# Patient Record
Sex: Male | Born: 1990 | Hispanic: Yes | Marital: Single | Smoking: Former smoker
Health system: Southern US, Community
[De-identification: ages and names within clinical notes are randomized; demographics above are authoritative.]

## PROBLEM LIST (undated history)

## (undated) DIAGNOSIS — E785 Hyperlipidemia, unspecified: Secondary | ICD-10-CM

## (undated) DIAGNOSIS — E119 Type 2 diabetes mellitus without complications: Secondary | ICD-10-CM

## (undated) HISTORY — PX: NO PAST SURGERIES: SHX2092

## (undated) HISTORY — DX: Hyperlipidemia, unspecified: E78.5

---

## 2020-11-13 ENCOUNTER — Ambulatory Visit
Admission: EM | Admit: 2020-11-13 | Discharge: 2020-11-13 | Disposition: A | Discharge status: Home / Self Care | Payer: Self-pay | Attending: Physician Assistant | Admitting: Physician Assistant

## 2020-11-13 ENCOUNTER — Encounter: Payer: Self-pay | Admitting: Emergency Medicine

## 2020-11-13 ENCOUNTER — Other Ambulatory Visit: Payer: Self-pay

## 2020-11-13 DIAGNOSIS — L02412 Cutaneous abscess of left axilla: Secondary | ICD-10-CM

## 2020-11-13 MED ORDER — DOXYCYCLINE HYCLATE 100 MG PO CAPS: 100.0000 mg | ORAL_CAPSULE | Freq: Two times a day (BID) | ORAL | 0 refills | Status: AC

## 2020-11-13 NOTE — ED Provider Notes (Signed)
[facility]    CSN: [id number] Arrival date & time: 11/13/20  1259      History   Chief Complaint Chief Complaint  Patient presents with  . Abscess    HPI Joe Kelly is a 30 y.o. male presenting for an area of swelling, redness and pain of the left axilla x4 days.  Patient denies any injury.  He has applied a topical over-the-counter cream for infection without any improvement in symptoms.  He denies any drainage from the area.  Denies any similar problem in the past.  No history of recurrent skin infections or MRSA.  Has not taken anything for pain relief.  No other complaints.  Patient is mostly Spanish-speaking and his friend is here to help translate.  HPI  History reviewed. No pertinent past medical history.  There are no problems to display for this patient.   History reviewed. No pertinent surgical history.     Home Medications    Prior to Admission medications   Medication Sig Start Date End Date Taking? Authorizing Provider  doxycycline (VIBRAMYCIN) 100 MG capsule Take 1 capsule (100 mg total) by mouth 2 (two) times daily for 10 days. 11/13/20 11/23/20 Yes Shirlee Latch, PA-C    Family History History reviewed. No pertinent family history.  Social History Social History   Tobacco Use  . Smoking status: Current Every Day Smoker    Types: Cigarettes  . Smokeless tobacco: Never Used  Vaping Use  . Vaping Use: Never used  Substance Use Topics  . Alcohol use: Not Currently  . Drug use: Not Currently     Allergies   Patient has no known allergies.   Review of Systems Review of Systems  Constitutional: Negative for fatigue and fever.  Skin: Positive for color change. Negative for wound.       "abscess" of axilla  Neurological: Negative for weakness.  Hematological: Negative for adenopathy.     Physical Exam Triage Vital Signs ED Triage Vitals  Enc Vitals Group     BP 11/13/20 1329 128/81     Pulse Rate 11/13/20  1329 92     Resp 11/13/20 1329 18     Temp 11/13/20 1329 98.6 F (37 C)     Temp Source 11/13/20 1329 Oral     SpO2 11/13/20 1329 98 %     Weight 11/13/20 1327 130 lb (59 kg)     Height 11/13/20 1327 5\' 7"  (1.702 m)     Head Circumference --      Peak Flow --      Pain Score 11/13/20 1326 8     Pain Loc --      Pain Edu? --      Excl. in GC? --    No data found.  Updated Vital Signs BP 128/81 (BP Location: Right Arm)   Pulse 92   Temp 98.6 F (37 C) (Oral)   Resp 18   Ht 5\' 7"  (1.702 m)   Wt 130 lb (59 kg)   SpO2 98%   BMI 20.36 kg/m    Physical Exam Vitals and nursing note reviewed.  Constitutional:      General: He is not in acute distress.    Appearance: Normal appearance. He is well-developed. He is not ill-appearing.  HENT:     Head: Normocephalic and atraumatic.  Eyes:     General: No scleral icterus.    Conjunctiva/sclera: Conjunctivae normal.  Cardiovascular:     Rate and Rhythm: Normal  rate and regular rhythm.     Heart sounds: Normal heart sounds.  Pulmonary:     Effort: Pulmonary effort is normal. No respiratory distress.     Breath sounds: Normal breath sounds.  Musculoskeletal:     Cervical back: Neck supple.  Skin:    General: Skin is warm and dry.     Findings: Lesion (4cm x 2.5 cm oval area of induration and erythema with fluctuance. Entire area is TTP. ) present.  Neurological:     General: No focal deficit present.     Mental Status: He is alert. Mental status is at baseline.     Motor: No weakness.     Gait: Gait normal.  Psychiatric:        Mood and Affect: Mood normal.        Behavior: Behavior normal.        Thought Content: Thought content normal.      UC Treatments / Results  Labs (all labs ordered are listed, but only abnormal results are displayed) Labs Reviewed - No data to display  EKG   Radiology No results found.  Procedures Incision and Drainage  Date/Time: 11/13/2020 3:00 PM Performed by: Shirlee Latch,  PA-C Authorized by: Shirlee Latch, PA-C   Consent:    Consent obtained:  Verbal   Consent given by:  Patient   Risks, benefits, and alternatives were discussed: yes     Risks discussed:  Bleeding, infection, incomplete drainage and pain   Alternatives discussed:  Alternative treatment, delayed treatment and observation Universal protocol:    Patient identity confirmed:  Verbally with patient and arm band Location:    Type:  Abscess   Size:  4 cm x 2.5 cm   Location:  Upper extremity   Upper extremity location:  Arm   Arm location:  L upper arm Pre-procedure details:    Skin preparation:  Povidone-iodine Anesthesia:    Anesthesia method:  Local infiltration   Local anesthetic:  Lidocaine 1% w/o epi Procedure type:    Complexity:  Simple Procedure details:    Incision types:  Stab incision   Incision depth:  Dermal   Wound management:  Probed and deloculated and irrigated with saline   Drainage:  Bloody and purulent   Drainage amount:  Moderate   Wound treatment:  Wound left open   Packing materials:  1/4 in iodoform gauze Post-procedure details:    Procedure completion:  Tolerated well, no immediate complications   (including critical care time)  Medications Ordered in UC Medications - No data to display  Initial Impression / Assessment and Plan / UC Course  I have reviewed the triage vital signs and the nursing notes.  Pertinent labs & imaging results that were available during my care of the patient were reviewed by me and considered in my medical decision making (see chart for details).   Simple abscess I&D of the left axillary region.  See procedure details above.  Successful I&D as the area of induration and swelling resolved.  Packed the area and discussed care with patient.  Sent doxycycline for him to start.  Advised him to follow back up with her clinic in 2 days for reevaluation to see if it needs to be repacked.  I did review ED precautions with patient in  case any symptoms suddenly worsen.  Advised supportive care with Tylenol and/or ibuprofen for pain relief.  Follow-up in 2 days or sooner if needed.   Final Clinical Impressions(s) / UC  Diagnoses   Final diagnoses:  Abscess of left axilla     Discharge Instructions     He drenado el absceso hoy. Debe regresar en 2 das para que lo revisemos nuevamente para ver si es Armed forces logistics/support/administrative officer a empaquetarlo. No saque el embalaje. No moje el rea. Tome los [employer] se describe. Tome Tylenol y/o Motrin para Chief Technology Officer. Acuda al servicio de urgencias si tiene dolor intenso o fiebre.  I have drained the abscess today. You should come back in 2 days to have [place] look at it again to see if it needs to be re-packed. Do not take packing out. Do not get area wet. Take antibiotics as described. Take Tylenol and/or Motrin for pain. Go to ED for severe pain or fever.    ED Prescriptions    Medication Sig Dispense Auth. Provider   doxycycline (VIBRAMYCIN) 100 MG capsule Take 1 capsule (100 mg total) by mouth 2 (two) times daily for 10 days. 20 capsule Shirlee Latch, PA-C     PDMP not reviewed this encounter.   Shirlee Latch, PA-C 11/13/20 1503

## 2020-11-13 NOTE — ED Triage Notes (Signed)
Pt has abscess in the left axilla area. started bout 3 days ago. He had arm in a sling and wrapped up.

## 2020-11-13 NOTE — Discharge Instructions (Addendum)
He drenado el absceso hoy. Debe regresar en 2 das para que lo revisemos nuevamente para ver si es Armed forces logistics/support/administrative officer a empaquetarlo. No saque el embalaje. No moje el rea. Tome los [employer] se describe. Tome Tylenol y/o Motrin para Chief Technology Officer. Acuda al servicio de urgencias si tiene dolor intenso o fiebre.  I have drained the abscess today. You should come back in 2 days to have [place] look at it again to see if it needs to be re-packed. Do not take packing out. Do not get area wet. Take antibiotics as described. Take Tylenol and/or Motrin for pain. Go to ED for severe pain or fever.

## 2020-11-16 ENCOUNTER — Other Ambulatory Visit: Payer: Self-pay

## 2020-11-16 ENCOUNTER — Ambulatory Visit
Admission: EM | Admit: 2020-11-16 | Discharge: 2020-11-16 | Disposition: A | Discharge status: Home / Self Care | Payer: Self-pay | Attending: Family Medicine | Admitting: Family Medicine

## 2020-11-16 DIAGNOSIS — Z5189 Encounter for other specified aftercare: Secondary | ICD-10-CM

## 2020-11-16 NOTE — ED Provider Notes (Signed)
[facility]    CSN: [id number] Arrival date & time: 11/16/20  1204      History   Chief Complaint Chief Complaint  Patient presents with  . packing removal    Left axilla   HPI  30 year old male presents for packing removal.  Patient was seen here on 3/14.  Had incision and drainage of abscess of left axilla.  He states that he is improved.  Endorses compliance with antibiotic therapy.  Needs packing removal and reassessment today.  No fever.  No other complaints.  Home Medications    Prior to Admission medications   Medication Sig Start Date End Date Taking? Authorizing Provider  doxycycline (VIBRAMYCIN) 100 MG capsule Take 1 capsule (100 mg total) by mouth 2 (two) times daily for 10 days. 11/13/20 11/23/20 Yes Shirlee Latch, PA-C    Family History History reviewed. No pertinent family history.  Social History Social History   Tobacco Use  . Smoking status: Current Every Day Smoker    Types: Cigarettes  . Smokeless tobacco: Never Used  Vaping Use  . Vaping Use: Never used  Substance Use Topics  . Alcohol use: Not Currently  . Drug use: Not Currently     Allergies   Patient has no known allergies.   Review of Systems Review of Systems Per HPI  Physical Exam Triage Vital Signs ED Triage Vitals  Enc Vitals Group     BP 11/16/20 1233 126/79     Pulse Rate 11/16/20 1233 90     Resp 11/16/20 1233 18     Temp 11/16/20 1233 98.6 F (37 C)     Temp Source 11/16/20 1233 Oral     SpO2 11/16/20 1233 99 %     Weight --      Height --      Head Circumference --      Peak Flow --      Pain Score 11/16/20 1229 0     Pain Loc --      Pain Edu? --      Excl. in GC? --    Updated Vital Signs BP 126/79 (BP Location: Right Arm)   Pulse 90   Temp 98.6 F (37 C) (Oral)   Resp 18   SpO2 99%   Visual Acuity Right Eye Distance:   Left Eye Distance:   Bilateral Distance:    Right Eye Near:   Left Eye Near:    Bilateral Near:     Physical  Exam Constitutional:      General: He is not in acute distress.    Appearance: Normal appearance. He is not ill-appearing.  Skin:    Comments: Left axilla -wound noted with packing.  Purulent discharge noted on dressing.  No surrounding erythema.  Slight induration.  Neurological:     Mental Status: He is alert.  Psychiatric:        Mood and Affect: Mood normal.        Behavior: Behavior normal.    UC Treatments / Results  Labs (all labs ordered are listed, but only abnormal results are displayed) Labs Reviewed - No data to display  EKG   Radiology No results found.  Procedures Procedures (including critical care time)  Medications Ordered in UC Medications - No data to display  Initial Impression / Assessment and Plan / UC Course  I have reviewed the triage vital signs and the nursing notes.  Pertinent labs & imaging results that were available during my care  of the patient were reviewed by me and considered in my medical decision making (see chart for details).    30 year old male presents for reassessment and packing removal.  Abscess looks good.  Significant improvement.  Packing removed.  Wound was dressed today.  Advise completion of antibiotic therapy and supportive care.  Final Clinical Impressions(s) / UC Diagnoses   Final diagnoses:  Wound check, abscess     Discharge Instructions     Mantenga el rea limpia.  Cambie el vendaje diariamente.  Termina el antibitico.    ED Prescriptions    None     PDMP not reviewed this encounter.   Tommie Sams, [place] 11/16/20 1319

## 2020-11-16 NOTE — ED Triage Notes (Signed)
Pt here for packing removal from I&D abscess in his left axilla. Pt reports he is taking abx as prescribed.

## 2020-11-16 NOTE — Discharge Instructions (Signed)
Mantenga el rea limpia.  Cambie el vendaje diariamente.  Termina el antibitico.

## 2020-12-05 ENCOUNTER — Other Ambulatory Visit: Payer: Self-pay

## 2020-12-05 ENCOUNTER — Inpatient Hospital Stay
Admission: EM | Admit: 2020-12-05 | Discharge: 2020-12-07 | DRG: 638 | Disposition: A | Discharge status: Home / Self Care | Payer: Self-pay | Attending: Internal Medicine | Admitting: Internal Medicine

## 2020-12-05 ENCOUNTER — Emergency Department: Payer: Self-pay

## 2020-12-05 ENCOUNTER — Ambulatory Visit
Admission: EM | Admit: 2020-12-05 | Discharge: 2020-12-05 | Disposition: A | Discharge status: Home / Self Care | Payer: Self-pay | Attending: Sports Medicine | Admitting: Sports Medicine

## 2020-12-05 DIAGNOSIS — R42 Dizziness and giddiness: Secondary | ICD-10-CM

## 2020-12-05 DIAGNOSIS — E860 Dehydration: Secondary | ICD-10-CM | POA: Diagnosis present

## 2020-12-05 DIAGNOSIS — R3589 Other polyuria: Secondary | ICD-10-CM | POA: Insufficient documentation

## 2020-12-05 DIAGNOSIS — R634 Abnormal weight loss: Secondary | ICD-10-CM | POA: Insufficient documentation

## 2020-12-05 DIAGNOSIS — E1065 Type 1 diabetes mellitus with hyperglycemia: Secondary | ICD-10-CM | POA: Insufficient documentation

## 2020-12-05 DIAGNOSIS — Z833 Family history of diabetes mellitus: Secondary | ICD-10-CM

## 2020-12-05 DIAGNOSIS — E1110 Type 2 diabetes mellitus with ketoacidosis without coma: Principal | ICD-10-CM | POA: Diagnosis present

## 2020-12-05 DIAGNOSIS — E876 Hypokalemia: Secondary | ICD-10-CM | POA: Diagnosis present

## 2020-12-05 DIAGNOSIS — R1084 Generalized abdominal pain: Secondary | ICD-10-CM | POA: Insufficient documentation

## 2020-12-05 DIAGNOSIS — IMO0002 Reserved for concepts with insufficient information to code with codable children: Secondary | ICD-10-CM

## 2020-12-05 DIAGNOSIS — R631 Polydipsia: Secondary | ICD-10-CM | POA: Insufficient documentation

## 2020-12-05 DIAGNOSIS — E1310 Other specified diabetes mellitus with ketoacidosis without coma: Secondary | ICD-10-CM

## 2020-12-05 DIAGNOSIS — Z20822 Contact with and (suspected) exposure to covid-19: Secondary | ICD-10-CM | POA: Diagnosis present

## 2020-12-05 DIAGNOSIS — E871 Hypo-osmolality and hyponatremia: Secondary | ICD-10-CM | POA: Diagnosis present

## 2020-12-05 DIAGNOSIS — R5383 Other fatigue: Secondary | ICD-10-CM | POA: Insufficient documentation

## 2020-12-05 HISTORY — DX: Type 2 diabetes mellitus without complications: E11.9

## 2020-12-05 LAB — COMPREHENSIVE METABOLIC PANEL
ALT: 43 U/L (ref 0–44)
AST: 16 U/L (ref 15–41)
Albumin: 4.4 g/dL (ref 3.5–5.0)
Alkaline Phosphatase: 117 U/L (ref 38–126)
Anion gap: 16 — ABNORMAL HIGH (ref 5–15)
BUN: 16 mg/dL (ref 6–20)
CO2: 11 mmol/L — ABNORMAL LOW (ref 22–32)
Calcium: 8.7 mg/dL — ABNORMAL LOW (ref 8.9–10.3)
Chloride: 97 mmol/L — ABNORMAL LOW (ref 98–111)
Creatinine, Ser: 1.08 mg/dL (ref 0.61–1.24)
GFR, Estimated: >60 mL/min (ref >60)
Glucose, Bld: 398 mg/dL — ABNORMAL HIGH (ref 70–99)
Potassium: 4 mmol/L (ref 3.5–5.1)
Sodium: 124 mmol/L — ABNORMAL LOW (ref 135–145)
Total Bilirubin: 1.3 mg/dL — ABNORMAL HIGH (ref 0.3–1.2)
Total Protein: 8 g/dL (ref 6.5–8.1)

## 2020-12-05 LAB — CBC WITH DIFFERENTIAL/PLATELET
Abs Immature Granulocytes: 0.03 10*3/uL (ref 0.00–0.07)
Basophils Absolute: 0 10*3/uL (ref 0.0–0.1)
Basophils Relative: 0 %
Eosinophils Absolute: 0 10*3/uL (ref 0.0–0.5)
Eosinophils Relative: 0 %
HCT: 45.1 % (ref 39.0–52.0)
Hemoglobin: 15.7 g/dL (ref 13.0–17.0)
Immature Granulocytes: 0 %
Lymphocytes Relative: 19 %
Lymphs Abs: 1.5 10*3/uL (ref 0.7–4.0)
MCH: 29.5 pg (ref 26.0–34.0)
MCHC: 34.8 g/dL (ref 30.0–36.0)
MCV: 84.6 fL (ref 80.0–100.0)
Monocytes Absolute: 0.5 10*3/uL (ref 0.1–1.0)
Monocytes Relative: 6 %
Neutro Abs: 5.7 10*3/uL (ref 1.7–7.7)
Neutrophils Relative %: 75 %
Platelets: 342 10*3/uL (ref 150–400)
RBC: 5.33 MIL/uL (ref 4.22–5.81)
RDW: 12.8 % (ref 11.5–15.5)
WBC: 7.7 10*3/uL (ref 4.0–10.5)
nRBC: 0 % (ref 0.0–0.2)

## 2020-12-05 LAB — URINALYSIS, COMPLETE (UACMP) WITH MICROSCOPIC
Bacteria, UA: NONE SEEN
Bacteria, UA: NONE SEEN
Bilirubin Urine: NEGATIVE
Bilirubin Urine: NEGATIVE
Glucose, UA: 500 mg/dL — AB
Glucose, UA: >=500 mg/dL — AB
Ketones, ur: >160 mg/dL — AB
Ketones, ur: 80 mg/dL — AB
Leukocytes,Ua: NEGATIVE
Leukocytes,Ua: NEGATIVE
Nitrite: NEGATIVE
Nitrite: NEGATIVE
Protein, ur: 30 mg/dL — AB
Protein, ur: 100 mg/dL — AB
Specific Gravity, Urine: 1.025 (ref 1.005–1.030)
Specific Gravity, Urine: 1.031 — ABNORMAL HIGH (ref 1.005–1.030)
WBC, UA: NONE SEEN WBC/hpf (ref 0–5)
pH: 5 (ref 5.0–8.0)
pH: 5 (ref 5.0–8.0)

## 2020-12-05 LAB — CBC
HCT: 46.6 % (ref 39.0–52.0)
HCT: 39.3 % (ref 39.0–52.0)
Hemoglobin: 16.2 g/dL (ref 13.0–17.0)
Hemoglobin: 13.8 g/dL (ref 13.0–17.0)
MCH: 29.7 pg (ref 26.0–34.0)
MCH: 29.7 pg (ref 26.0–34.0)
MCHC: 34.8 g/dL (ref 30.0–36.0)
MCHC: 35.1 g/dL (ref 30.0–36.0)
MCV: 85.5 fL (ref 80.0–100.0)
MCV: 84.7 fL (ref 80.0–100.0)
Platelets: 386 10*3/uL (ref 150–400)
Platelets: 301 10*3/uL (ref 150–400)
RBC: 5.45 MIL/uL (ref 4.22–5.81)
RBC: 4.64 MIL/uL (ref 4.22–5.81)
RDW: 12.7 % (ref 11.5–15.5)
RDW: 12.5 % (ref 11.5–15.5)
WBC: 7.9 10*3/uL (ref 4.0–10.5)
WBC: 6 10*3/uL (ref 4.0–10.5)
nRBC: 0 % (ref 0.0–0.2)
nRBC: 0 % (ref 0.0–0.2)

## 2020-12-05 LAB — CBG MONITORING, ED
Glucose-Capillary: 325 mg/dL — ABNORMAL HIGH (ref 70–99)
Glucose-Capillary: 288 mg/dL — ABNORMAL HIGH (ref 70–99)
Glucose-Capillary: 223 mg/dL — ABNORMAL HIGH (ref 70–99)
Glucose-Capillary: 235 mg/dL — ABNORMAL HIGH (ref 70–99)

## 2020-12-05 LAB — BASIC METABOLIC PANEL
Anion gap: 17 — ABNORMAL HIGH (ref 5–15)
Anion gap: 12 (ref 5–15)
BUN: 15 mg/dL (ref 6–20)
BUN: 13 mg/dL (ref 6–20)
CO2: 11 mmol/L — ABNORMAL LOW (ref 22–32)
CO2: 16 mmol/L — ABNORMAL LOW (ref 22–32)
Calcium: 9 mg/dL (ref 8.9–10.3)
Calcium: 8.6 mg/dL — ABNORMAL LOW (ref 8.9–10.3)
Chloride: 98 mmol/L (ref 98–111)
Chloride: 102 mmol/L (ref 98–111)
Creatinine, Ser: 0.88 mg/dL (ref 0.61–1.24)
Creatinine, Ser: 0.71 mg/dL (ref 0.61–1.24)
GFR, Estimated: >60 mL/min (ref >60)
GFR, Estimated: >60 mL/min (ref >60)
Glucose, Bld: 326 mg/dL — ABNORMAL HIGH (ref 70–99)
Glucose, Bld: 226 mg/dL — ABNORMAL HIGH (ref 70–99)
Potassium: 4 mmol/L (ref 3.5–5.1)
Potassium: 4.3 mmol/L (ref 3.5–5.1)
Sodium: 126 mmol/L — ABNORMAL LOW (ref 135–145)
Sodium: 130 mmol/L — ABNORMAL LOW (ref 135–145)

## 2020-12-05 LAB — BLOOD GAS, VENOUS
Acid-base deficit: 15.9 mmol/L — ABNORMAL HIGH (ref 0.0–2.0)
Bicarbonate: 11.3 mmol/L — ABNORMAL LOW (ref 20.0–28.0)
O2 Saturation: 41.1 %
Patient temperature: 37
pCO2, Ven: 31 mmHg — ABNORMAL LOW (ref 44.0–60.0)
pH, Ven: 7.17 — CL (ref 7.250–7.430)
pO2, Ven: 31 mmHg — CL (ref 32.0–45.0)

## 2020-12-05 LAB — RESP PANEL BY RT-PCR (FLU A&B, COVID) ARPGX2
Influenza A by PCR: NEGATIVE
Influenza B by PCR: NEGATIVE
SARS Coronavirus 2 by RT PCR: NEGATIVE

## 2020-12-05 LAB — LIPASE, BLOOD: Lipase: 47 U/L (ref 11–51)

## 2020-12-05 LAB — HEPATIC FUNCTION PANEL
ALT: 43 U/L (ref 0–44)
AST: 20 U/L (ref 15–41)
Albumin: 4.5 g/dL (ref 3.5–5.0)
Alkaline Phosphatase: 122 U/L (ref 38–126)
Bilirubin, Direct: 0.2 mg/dL (ref 0.0–0.2)
Indirect Bilirubin: 1.7 mg/dL — ABNORMAL HIGH (ref 0.3–0.9)
Total Bilirubin: 1.9 mg/dL — ABNORMAL HIGH (ref 0.3–1.2)
Total Protein: 8.2 g/dL — ABNORMAL HIGH (ref 6.5–8.1)

## 2020-12-05 LAB — TROPONIN I (HIGH SENSITIVITY): Troponin I (High Sensitivity): 8 ng/L (ref <18)

## 2020-12-05 LAB — TSH: TSH: 1.934 u[IU]/mL (ref 0.350–4.500)

## 2020-12-05 LAB — GLUCOSE, CAPILLARY
Glucose-Capillary: 239 mg/dL — ABNORMAL HIGH (ref 70–99)
Glucose-Capillary: 212 mg/dL — ABNORMAL HIGH (ref 70–99)

## 2020-12-05 LAB — BETA-HYDROXYBUTYRIC ACID
Beta-Hydroxybutyric Acid: >8 mmol/L — ABNORMAL HIGH (ref 0.05–0.27)
Beta-Hydroxybutyric Acid: 5.5 mmol/L — ABNORMAL HIGH (ref 0.05–0.27)

## 2020-12-05 LAB — MRSA PCR SCREENING: MRSA by PCR: NEGATIVE

## 2020-12-05 LAB — HEMOGLOBIN A1C
Hgb A1c MFr Bld: 11.5 % — ABNORMAL HIGH (ref 4.8–5.6)
Hgb A1c MFr Bld: 11.7 % — ABNORMAL HIGH (ref 4.8–5.6)
Mean Plasma Glucose: 283.35 mg/dL
Mean Plasma Glucose: 289.09 mg/dL

## 2020-12-05 MED ORDER — POTASSIUM CHLORIDE 10 MEQ/100ML IV SOLN
10.0000 meq | INTRAVENOUS | Status: AC
  Administered 2020-12-06 (×2): 10 meq via INTRAVENOUS
  Filled 2020-12-05 (×2): qty 100

## 2020-12-05 MED ORDER — DEXTROSE 50 % IV SOLN: 0.0000 mL | INTRAVENOUS | Status: DC | PRN

## 2020-12-05 MED ORDER — LACTATED RINGERS IV SOLN: INTRAVENOUS | Status: DC

## 2020-12-05 MED ORDER — LACTATED RINGERS IV BOLUS
20.0000 mL/kg | Freq: Once | INTRAVENOUS | Status: AC
  Administered 2020-12-05: 1034 mL via INTRAVENOUS

## 2020-12-05 MED ORDER — INSULIN REGULAR(HUMAN) IN NACL 100-0.9 UT/100ML-% IV SOLN: INTRAVENOUS | Status: DC

## 2020-12-05 MED ORDER — DEXTROSE IN LACTATED RINGERS 5 % IV SOLN: INTRAVENOUS | Status: DC

## 2020-12-05 MED ORDER — LACTATED RINGERS IV BOLUS
1000.0000 mL | Freq: Once | INTRAVENOUS | Status: AC
  Administered 2020-12-05: 1000 mL via INTRAVENOUS

## 2020-12-05 MED ORDER — INSULIN REGULAR(HUMAN) IN NACL 100-0.9 UT/100ML-% IV SOLN
INTRAVENOUS | Status: DC
  Administered 2020-12-05: 4.4 [IU]/h via INTRAVENOUS
  Filled 2020-12-05: qty 100

## 2020-12-05 MED ORDER — ENOXAPARIN SODIUM 40 MG/0.4ML ~~LOC~~ SOLN
40.0000 mg | SUBCUTANEOUS | Status: DC
  Administered 2020-12-05 – 2020-12-06 (×2): 40 mg via SUBCUTANEOUS
  Filled 2020-12-05 (×2): qty 0.4

## 2020-12-05 MED ORDER — POTASSIUM CHLORIDE 10 MEQ/100ML IV SOLN
10.0000 meq | INTRAVENOUS | Status: AC
  Administered 2020-12-05 (×2): 10 meq via INTRAVENOUS
  Filled 2020-12-05 (×2): qty 100

## 2020-12-05 MED ORDER — CHLORHEXIDINE GLUCONATE CLOTH 2 % EX PADS
6.0000 | MEDICATED_PAD | Freq: Every day | CUTANEOUS | Status: DC
  Administered 2020-12-05: 6 via TOPICAL

## 2020-12-05 NOTE — ED Triage Notes (Signed)
Pt comes from [facility] with c/o hyperglycemia. Pt states he was just dx today there with type 1 diabetes. Pt was informed to come here for further care.  Pt states loss of appetite, fatigue and lightheaded for 1 week.  Pt states it was in 400s.

## 2020-12-05 NOTE — ED Triage Notes (Signed)
Pt reports feeling lightheaded x1 week also reports having loss of appetite and feeling fatigue.

## 2020-12-05 NOTE — ED Provider Notes (Signed)
[facility] Emergency Department Provider Note  ____________________________________________   Event Date/Time   First MD Initiated Contact with Patient 12/05/20 1730     (approximate)  I have reviewed the triage vital signs and the nursing notes.   HISTORY  Chief Complaint Hyperglycemia    HPI Joe Kelly is a 30 y.o. male otherwise healthy who comes in for new diagnosis of diabetes.  Patient was at urgent care where they noted that his sugar was elevated that he should come into the ER for admission.  Patient reports having 1 week of weight loss, increased thirst, increased urination and fatigue.  Is been constant, nothing makes better, nothing makes it worse.  It is moderate.  Denies ever having this previously.  Denies ever being told he is diabetic.  He reports a little of shortness of breath associated with the.   Spanish interpretor was used          Past Medical History:  Diagnosis Date  . Diabetes mellitus without complication (HCC)     There are no problems to display for this patient.   History reviewed. No pertinent surgical history.  Prior to Admission medications   Not on File    Allergies Coconut flavor  No family history on file.  Social History Social History   Tobacco Use  . Smoking status: Never Smoker  . Smokeless tobacco: Never Used  Vaping Use  . Vaping Use: Never used  Substance Use Topics  . Alcohol use: Not Currently  . Drug use: Not Currently      Review of Systems Constitutional: No fever/chills, weightloss  Eyes: No visual changes. ENT: No sore throat. Increased thirst  Cardiovascular: Denies chest pain. Respiratory: + sob  Gastrointestinal: No abdominal pain.  No nausea, no vomiting.  No diarrhea.  No constipation. Genitourinary: Negative for dysuria. Increased urination  Musculoskeletal: Negative for back pain. Skin: Negative for rash. Neurological: Negative for headaches,  focal weakness or numbness. All other ROS negative ____________________________________________   PHYSICAL EXAM:  VITAL SIGNS: ED Triage Vitals  Enc Vitals Group     BP 12/05/20 1635 139/90     Pulse Rate 12/05/20 1635 (!) 107     Resp 12/05/20 1635 18     Temp 12/05/20 1635 98.2 F (36.8 C)     Temp Source 12/05/20 1635 Oral     SpO2 12/05/20 1635 100 %     Weight 12/05/20 1633 114 lb (51.7 kg)     Height 12/05/20 1633 5' (1.524 m)     Head Circumference --      Peak Flow --      Pain Score 12/05/20 1632 6     Pain Loc --      Pain Edu? --      Excl. in GC? --     Constitutional: Alert and oriented. Well appearing and in no acute distress. Eyes: Conjunctivae are normal. EOMI. Head: Atraumatic. Nose: No congestion/rhinnorhea. Mouth/Throat: Mucous membranes are dry    Neck: No stridor. Trachea Midline. FROM Cardiovascular: tachy, regular rhythm. Grossly normal heart sounds.  Good peripheral circulation. Respiratory: Normal respiratory effort.  No retractions. Lungs CTAB. Gastrointestinal: Soft and nontender. No distention. No abdominal bruits.  Musculoskeletal: No lower extremity tenderness nor edema.  No joint effusions. Neurologic:  Normal speech and language. No gross focal neurologic deficits are appreciated.  Skin:  Skin is warm, dry and intact. No rash noted. Psychiatric: Mood and affect are normal. Speech and behavior are normal.  GU: Deferred   ____________________________________________   LABS (all labs ordered are listed, but only abnormal results are displayed)  Labs Reviewed  BASIC METABOLIC PANEL - Abnormal; Notable for the following components:      Result Value   Sodium 126 (*)    CO2 11 (*)    Glucose, Bld 326 (*)    Anion gap 17 (*)    All other components within normal limits  URINALYSIS, COMPLETE (UACMP) WITH MICROSCOPIC - Abnormal; Notable for the following components:   Color, Urine YELLOW (*)    APPearance CLEAR (*)    Specific Gravity,  Urine 1.031 (*)    Glucose, UA >=500 (*)    Hgb urine dipstick MODERATE (*)    Ketones, ur 80 (*)    Protein, ur 100 (*)    All other components within normal limits  CBG MONITORING, ED - Abnormal; Notable for the following components:   Glucose-Capillary 325 (*)    All other components within normal limits  RESP PANEL BY RT-PCR (FLU A&B, COVID) ARPGX2  CBC  BETA-HYDROXYBUTYRIC ACID  HEPATIC FUNCTION PANEL  BLOOD GAS, VENOUS  HEMOGLOBIN A1C  CBG MONITORING, ED   ____________________________________________   ED ECG REPORT I, Concha Se, the attending physician, personally viewed and interpreted this ECG.  Sinus, some ST elevation in 1 to aVL V2 looks more like early repole though he does have a T wave version in lead III, normal intervals ____________________________________________  RADIOLOGY I, Concha Se, personally viewed and evaluated these images (plain radiographs) as part of my medical decision making, as well as reviewing the written report by the radiologist.  ED MD interpretation:  No PNA   Official radiology report(s): DG Chest 2 View  Result Date: 12/05/2020 CLINICAL DATA:  Shortness of breath EXAM: CHEST - 2 VIEW COMPARISON:  None. FINDINGS: The heart size and mediastinal contours are within normal limits. Both lungs are clear. The visualized skeletal structures are unremarkable. IMPRESSION: Negative. Electronically Signed   By: Charlett Nose M.D.   On: 12/05/2020 18:32    ____________________________________________   PROCEDURES  Procedure(s) performed (including Critical Care):  .Critical Care Performed by: Concha Se, MD Authorized by: Concha Se, MD   Critical care provider statement:    Critical care time (minutes):  45   Critical care was necessary to treat or prevent imminent or life-threatening deterioration of the following conditions:  Endocrine crisis   Critical care was time spent personally by me on the following activities:   Discussions with consultants, evaluation of patient's response to treatment, examination of patient, ordering and performing treatments and interventions, ordering and review of laboratory studies, ordering and review of radiographic studies, pulse oximetry, re-evaluation of patient's condition, obtaining history from patient or surrogate and review of old charts .1-3 Lead EKG Interpretation Performed by: Concha Se, MD Authorized by: Concha Se, MD     Interpretation: abnormal     ECG rate:  100s    ECG rate assessment: tachycardic     Rhythm: sinus rhythm     Ectopy: none     Conduction: normal       ____________________________________________   INITIAL IMPRESSION / ASSESSMENT AND PLAN / ED COURSE  Jerrell Mangel Kelly was evaluated in Emergency Department on 12/05/2020 for the symptoms described in the history of present illness. He was evaluated in the context of the global COVID-19 pandemic, which necessitated consideration that the patient might be at risk for infection with the SARS-CoV-2  virus that causes COVID-19. Institutional protocols and algorithms that pertain to the evaluation of patients at risk for COVID-19 are in a state of rapid change based on information released by regulatory bodies including the CDC and federal and state organizations. These policies and algorithms were followed during the patient's care in the ED.    Patient comes in with new diagnosis of diabetes.  Labs ordered to evaluate for DKA.  Urine evaluate for UTI.  Patient does report a little bit of shortness of breath and will get chest x-ray and EKG and covid swab to evaluate for PNA, ACS, Covid.  Lower supsicion for PE at this time.   Patient's bicarb is low at 11 and glucose is elevated in the 300s with an elevated anion gap.  Potassium is 4.  Patient will be started on insulin.  Will discuss the hospital team for admission.  His EKG was reading as concern for an acute MI but I do not think  that it is consistent with that.  Looks more like early repolarization although he does have a T wave inversion lead III.  I will add on a cardiac marker just to make sure but no chest pain at this time.   PH 7.17   K 4.0 will give some K repletion and start on insulin   Pt handed off to hopsital team   ____________________________________________   FINAL CLINICAL IMPRESSION(S) / ED DIAGNOSES   Final diagnoses:  Diabetic ketoacidosis without coma associated with other specified diabetes mellitus (HCC)      MEDICATIONS GIVEN DURING THIS VISIT:  Medications  insulin regular, human (MYXREDLIN) 100 units/ 100 mL infusion (has no administration in time range)  lactated ringers infusion (has no administration in time range)  dextrose 5 % in lactated ringers infusion (has no administration in time range)  dextrose 50 % solution 0-50 mL (has no administration in time range)  potassium chloride 10 mEq in 100 mL IVPB (has no administration in time range)  lactated ringers bolus 1,000 mL (1,000 mLs Intravenous New Bag/Given 12/05/20 1757)     ED Discharge Orders    None       Note:  This document was prepared using Dragon voice recognition software and may include unintentional dictation errors.   Concha Se, MD 12/05/20 [phone]

## 2020-12-05 NOTE — Discharge Instructions (Addendum)
You have new onset type 1 diabetes. Your labs also show that you have some electrolyte abnormalities and some abnormalities that will require you to go to the hospital and get IV insulin to correct. Please go to [facility] emergency department.  Your friend can drive you there.  You should not drive. I will call the hospital and let them know to expect you.

## 2020-12-05 NOTE — H&P (Signed)
History and Physical   Joe Kelly CVE:[id number] DOB: [birth year] DOA: 12/05/2020  Referring MD/NP/PA: Dr. Fuller Plan  PCP: Patient, No Pcp Per (Inactive)   Outpatient Specialists: None  Patient coming from: Home  Chief Complaint: Nausea with high blood sugar  HPI: Joe Kelly is a 30 y.o. male with medical history significant of no significant past medical history who was seen at the urgent care center today some weakness weight loss increased thirst and urination.  He was diagnosed with diabetes due to elevated blood sugar.  Patient sent to the ER and being evaluated as an newly diagnosed diabetes.  He has never been on any medication as he has never been diagnosed.  He has family history of diabetes.  He has some nausea and slight shortness of breath.  In the ER patient was noted to have evidence of elevated anion gap as well as acidosis.  His beta hydroxybutyrate acid was also elevated.  Patient diagnosed with DKA and being admitted to the hospital for evaluation and treatment..  ED Course: Temperature is 98.2 blood pressure 99/78 pulse 107 respiratory rate of 23 oxygen sats 99% room air.  Sodium was 124 potassium 4.0 chloride 97 CO2 of 11 glucose 398.  BUN 16 creatinine 1.08 calcium 8.7 with a gap of 16 CBC entirely within normal.  LFTs mostly within normal.  His venous pH 7.17.  Beta hydroxybutyric acid was also elevated.  It was more than 8.  His hemoglobin A1c is 11.5 with TSH 1.934.  Patient being admitted with DKA as well as newly diagnosed diabetes.  Review of Systems: As per HPI otherwise 10 point review of systems negative.    Past Medical History:  Diagnosis Date  . Diabetes mellitus without complication (HCC)     History reviewed. No pertinent surgical history.   reports that he has never smoked. He has never used smokeless tobacco. He reports previous alcohol use. He reports previous drug use.  Allergies  Allergen Reactions  . Coconut Flavor      No family history on file.   Prior to Admission medications   Not on File    Physical Exam: Vitals:   12/05/20 1930 12/05/20 2000 12/05/20 2030 12/05/20 2100  BP: 122/80 120/85 113/79 113/78  Pulse: 92 91 90 93  Resp: 14 19 19 19   Temp:      TempSrc:      SpO2: 99% 100% 100% 100%  Weight:      Height:          Constitutional: Acutely ill looking no distress Vitals:   12/05/20 1930 12/05/20 2000 12/05/20 2030 12/05/20 2100  BP: 122/80 120/85 113/79 113/78  Pulse: 92 91 90 93  Resp: 14 19 19 19   Temp:      TempSrc:      SpO2: 99% 100% 100% 100%  Weight:      Height:       Eyes: PERRL, lids and conjunctivae normal ENMT: Mucous membranes are dry. Posterior pharynx clear of any exudate or lesions.Normal dentition.  Neck: normal, supple, no masses, no thyromegaly Respiratory: clear to auscultation bilaterally, no wheezing, no crackles. Normal respiratory effort. No accessory muscle use.  Cardiovascular: Sinus tachycardia, no murmurs / rubs / gallops. No extremity edema. 2+ pedal pulses. No carotid bruits.  Abdomen: no tenderness, no masses palpated. No hepatosplenomegaly. Bowel sounds positive.  Musculoskeletal: no clubbing / cyanosis. No joint deformity upper and lower extremities. Good ROM, no contractures. Normal muscle tone.  Skin: no rashes, lesions,  ulcers. No induration Neurologic: CN 2-12 grossly intact. Sensation intact, DTR normal. Strength 5/5 in all 4.  Psychiatric: Normal judgment and insight. Alert and oriented x 3. Normal mood.     Labs on Admission: I have personally reviewed following labs and imaging studies  CBC: Recent Labs  Lab 12/05/20 1436 12/05/20 1637 12/05/20 2127  WBC 7.7 7.9 6.0  NEUTROABS 5.7  --   --   HGB 15.7 16.2 13.8  HCT 45.1 46.6 39.3  MCV 84.6 85.5 84.7  PLT 342 386 301   Basic Metabolic Panel: Recent Labs  Lab 12/05/20 1436 12/05/20 1637 12/05/20 2127  NA 124* 126* 130*  K 4.0 4.0 4.3  CL 97* 98 102  CO2 11*  11* 16*  GLUCOSE 398* 326* 226*  BUN 16 15 13   CREATININE 1.08 0.88 0.71  CALCIUM 8.7* 9.0 8.6*   GFR: Estimated Creatinine Clearance: 94.6 mL/min (by C-G formula based on SCr of 0.71 mg/dL). Liver Function Tests: Recent Labs  Lab 12/05/20 1436 12/05/20 1637  AST 16 20  ALT 43 43  ALKPHOS 117 122  BILITOT 1.3* 1.9*  PROT 8.0 8.2*  ALBUMIN 4.4 4.5   Recent Labs  Lab 12/05/20 1637  LIPASE 47   No results for input(s): AMMONIA in the last 168 hours. Coagulation Profile: No results for input(s): INR, PROTIME in the last 168 hours. Cardiac Enzymes: No results for input(s): CKTOTAL, CKMB, CKMBINDEX, TROPONINI in the last 168 hours. BNP (last 3 results) No results for input(s): PROBNP in the last 8760 hours. HbA1C: Recent Labs    12/05/20 1637  HGBA1C 11.5*   CBG: Recent Labs  Lab 12/05/20 1642 12/05/20 1831 12/05/20 1948 12/05/20 2053  GLUCAP 325* 288* 223* 235*   Lipid Profile: No results for input(s): CHOL, HDL, LDLCALC, TRIG, CHOLHDL, LDLDIRECT in the last 72 hours. Thyroid Function Tests: Recent Labs    12/05/20 1436  TSH 1.934   Anemia Panel: No results for input(s): VITAMINB12, FOLATE, FERRITIN, TIBC, IRON, RETICCTPCT in the last 72 hours. Urine analysis:    Component Value Date/Time   COLORURINE YELLOW (A) 12/05/2020 1637   APPEARANCEUR CLEAR (A) 12/05/2020 1637   LABSPEC 1.031 (H) 12/05/2020 1637   PHURINE 5.0 12/05/2020 1637   GLUCOSEU >=500 (A) 12/05/2020 1637   HGBUR MODERATE (A) 12/05/2020 1637   BILIRUBINUR NEGATIVE 12/05/2020 1637   KETONESUR 80 (A) 12/05/2020 1637   PROTEINUR 100 (A) 12/05/2020 1637   NITRITE NEGATIVE 12/05/2020 1637   LEUKOCYTESUR NEGATIVE 12/05/2020 1637   Sepsis Labs: @LABRCNTIP (procalcitonin:4,lacticidven:4) ) Recent Results (from the past 240 hour(s))  Resp Panel by RT-PCR (Flu A&B, Covid) Nasopharyngeal Swab     Status: None   Collection Time: 12/05/20  5:49 PM   Specimen: Nasopharyngeal Swab;  Nasopharyngeal(NP) swabs in vial transport medium  Result Value Ref Range Status   SARS Coronavirus 2 by RT PCR NEGATIVE NEGATIVE Final    Comment: (NOTE) SARS-CoV-2 target nucleic acids are NOT DETECTED.  The SARS-CoV-2 RNA is generally detectable in upper respiratory specimens during the acute phase of infection. The lowest concentration of SARS-CoV-2 viral copies this assay can detect is 138 copies/mL. A negative result does not preclude SARS-Cov-2 infection and should not be used as the sole basis for treatment or other patient management decisions. A negative result may occur with  improper specimen collection/handling, submission of specimen other than nasopharyngeal swab, presence of viral mutation(s) within the areas targeted by this assay, and inadequate number of viral copies(<138 copies/mL). A negative result  must be combined with clinical observations, patient history, and epidemiological information. The expected result is Negative.  Fact Sheet for Patients:  BloggerCourse.comhttps://www.fda.gov/media/152166/download  Fact Sheet for Healthcare Providers:  SeriousBroker.ithttps://www.fda.gov/media/152162/download  This test is no t yet approved or cleared by the [place] FDA and  has been authorized for detection and/or diagnosis of SARS-CoV-2 by FDA under an Emergency Use Authorization (EUA). This EUA will remain  in effect (meaning this test can be used) for the duration of the COVID-19 declaration under Section 564(b)(1) of the Act, 21 U.S.C.section 360bbb-3(b)(1), unless the authorization is terminated  or revoked sooner.       Influenza A by PCR NEGATIVE NEGATIVE Final   Influenza B by PCR NEGATIVE NEGATIVE Final    Comment: (NOTE) The Xpert Xpress SARS-CoV-2/FLU/RSV plus assay is intended as an aid in the diagnosis of influenza from Nasopharyngeal swab specimens and should not be used as a sole basis for treatment. Nasal washings and aspirates are unacceptable for Xpert Xpress  SARS-CoV-2/FLU/RSV testing.  Fact Sheet for Patients: BloggerCourse.comhttps://www.fda.gov/media/152166/download  Fact Sheet for Healthcare Providers: SeriousBroker.ithttps://www.fda.gov/media/152162/download  This test is not yet approved or cleared by the [place] FDA and has been authorized for detection and/or diagnosis of SARS-CoV-2 by FDA under an Emergency Use Authorization (EUA). This EUA will remain in effect (meaning this test can be used) for the duration of the COVID-19 declaration under Section 564(b)(1) of the Act, 21 U.S.C. section 360bbb-3(b)(1), unless the authorization is terminated or revoked.  Performed at [facility], [address] [place], [place] [address]   MRSA PCR Screening     Status: None   Collection Time: 12/05/20  9:27 PM   Specimen: Nasopharyngeal  Result Value Ref Range Status   MRSA by PCR NEGATIVE NEGATIVE Final    Comment:        The GeneXpert MRSA Assay (FDA approved for NASAL specimens only), is one component of a comprehensive MRSA colonization surveillance program. It is not intended to diagnose MRSA infection nor to guide or monitor treatment for MRSA infections. Performed at [facility], [address] [place], [place] [address]      Radiological Exams on Admission: DG Chest 2 View  Result Date: 12/05/2020 CLINICAL DATA:  Shortness of breath EXAM: CHEST - 2 VIEW COMPARISON:  None. FINDINGS: The heart size and mediastinal contours are within normal limits. Both lungs are clear. The visualized skeletal structures are unremarkable. IMPRESSION: Negative. Electronically Signed   By: Charlett NoseKevin  Dover M.D.   On: 12/05/2020 18:32      Assessment/Plan Principal Problem:   DKA (diabetic ketoacidosis) (HCC) Active Problems:   Hyponatremia   Dehydration     #1 diabetic ketoacidosis: In the newly diagnosed diabetes.  Mild.  Most likely type II.  Initiate the DKA protocol and subsequently transition to subcutaneous insulin.  #2 newly  diagnosed diabetes: Patient is newly diagnosed.  We will need education afterwards.  Dietary counseling.  Transition to oral regimen as much as possible.  #3 dehydration: As the results of DKA.  Continue fluids per DKA protocol.  #4 pseudohyponatremia: Due to elevated glucose.  Expected to get better.   DVT prophylaxis: Lovenox Code Status: Full code Family Communication: Family at bedside Disposition Plan: Home Consults called: None Admission status: Inpatient  Severity of Illness: The appropriate patient status for this patient is INPATIENT. Inpatient status is judged to be reasonable and necessary in order to provide the required intensity of service to ensure the patient's safety. The patient's presenting  symptoms, physical exam findings, and initial radiographic and laboratory data in the context of their chronic comorbidities is felt to place them at high risk for further clinical deterioration. Furthermore, it is not anticipated that the patient will be medically stable for discharge from the hospital within 2 midnights of admission. The following factors support the patient status of inpatient.   " The patient's presenting symptoms include elevated blood sugar. " The worrisome physical exam findings include dry mucous membranes. " The initial radiographic and laboratory data are worrisome because of evidence of anion gap with hyperglycemia. " The chronic co-morbidities include none.   * I certify that at the point of admission it is my clinical judgment that the patient will require inpatient hospital care spanning beyond 2 midnights from the point of admission due to high intensity of service, high risk for further deterioration and high frequency of surveillance required.Lonia Blood MD [facility] Pager [phone]  If 7PM-7AM, please contact night-coverage [email or web address] Password [facility]  12/05/2020, 10:59 PM

## 2020-12-06 DIAGNOSIS — E876 Hypokalemia: Secondary | ICD-10-CM

## 2020-12-06 LAB — BASIC METABOLIC PANEL
Anion gap: 7 (ref 5–15)
Anion gap: 6 (ref 5–15)
BUN: 11 mg/dL (ref 6–20)
BUN: 10 mg/dL (ref 6–20)
CO2: 17 mmol/L — ABNORMAL LOW (ref 22–32)
CO2: 20 mmol/L — ABNORMAL LOW (ref 22–32)
Calcium: 8.2 mg/dL — ABNORMAL LOW (ref 8.9–10.3)
Calcium: 8.4 mg/dL — ABNORMAL LOW (ref 8.9–10.3)
Chloride: 106 mmol/L (ref 98–111)
Chloride: 106 mmol/L (ref 98–111)
Creatinine, Ser: 0.56 mg/dL — ABNORMAL LOW (ref 0.61–1.24)
Creatinine, Ser: 0.55 mg/dL — ABNORMAL LOW (ref 0.61–1.24)
GFR, Estimated: >60 mL/min (ref >60)
GFR, Estimated: >60 mL/min (ref >60)
Glucose, Bld: 209 mg/dL — ABNORMAL HIGH (ref 70–99)
Glucose, Bld: 190 mg/dL — ABNORMAL HIGH (ref 70–99)
Potassium: 3.1 mmol/L — ABNORMAL LOW (ref 3.5–5.1)
Potassium: 3.3 mmol/L — ABNORMAL LOW (ref 3.5–5.1)
Sodium: 130 mmol/L — ABNORMAL LOW (ref 135–145)
Sodium: 132 mmol/L — ABNORMAL LOW (ref 135–145)

## 2020-12-06 LAB — GLUCOSE, CAPILLARY
Glucose-Capillary: 101 mg/dL — ABNORMAL HIGH (ref 70–99)
Glucose-Capillary: 172 mg/dL — ABNORMAL HIGH (ref 70–99)
Glucose-Capillary: 191 mg/dL — ABNORMAL HIGH (ref 70–99)
Glucose-Capillary: 193 mg/dL — ABNORMAL HIGH (ref 70–99)
Glucose-Capillary: 199 mg/dL — ABNORMAL HIGH (ref 70–99)
Glucose-Capillary: 204 mg/dL — ABNORMAL HIGH (ref 70–99)
Glucose-Capillary: 208 mg/dL — ABNORMAL HIGH (ref 70–99)
Glucose-Capillary: 371 mg/dL — ABNORMAL HIGH (ref 70–99)
Glucose-Capillary: 416 mg/dL — ABNORMAL HIGH (ref 70–99)

## 2020-12-06 LAB — PHOSPHORUS: Phosphorus: 2 mg/dL — ABNORMAL LOW (ref 2.5–4.6)

## 2020-12-06 LAB — MAGNESIUM: Magnesium: 1.7 mg/dL (ref 1.7–2.4)

## 2020-12-06 LAB — BETA-HYDROXYBUTYRIC ACID: Beta-Hydroxybutyric Acid: 1.99 mmol/L — ABNORMAL HIGH (ref 0.05–0.27)

## 2020-12-06 LAB — HIV ANTIBODY (ROUTINE TESTING W REFLEX): HIV Screen 4th Generation wRfx: NONREACTIVE

## 2020-12-06 MED ORDER — POTASSIUM CHLORIDE CRYS ER 20 MEQ PO TBCR
40.0000 meq | EXTENDED_RELEASE_TABLET | Freq: Once | ORAL | Status: AC
  Administered 2020-12-06: 40 meq via ORAL
  Filled 2020-12-06: qty 2

## 2020-12-06 MED ORDER — LIVING WELL WITH DIABETES BOOK - IN SPANISH
Freq: Once | Status: AC
  Filled 2020-12-06: qty 1

## 2020-12-06 MED ORDER — MAGNESIUM SULFATE 2 GM/50ML IV SOLN
2.0000 g | Freq: Once | INTRAVENOUS | Status: AC
  Administered 2020-12-06: 2 g via INTRAVENOUS
  Filled 2020-12-06: qty 50

## 2020-12-06 MED ORDER — INSULIN ASPART 100 UNIT/ML ~~LOC~~ SOLN
0.0000 [IU] | Freq: Three times a day (TID) | SUBCUTANEOUS | Status: DC
  Administered 2020-12-06: 9 [IU] via SUBCUTANEOUS
  Administered 2020-12-07: 3 [IU] via SUBCUTANEOUS
  Filled 2020-12-06 (×2): qty 1

## 2020-12-06 MED ORDER — INSULIN DETEMIR 100 UNIT/ML ~~LOC~~ SOLN
0.3000 [IU]/kg | SUBCUTANEOUS | Status: DC
  Administered 2020-12-06: 16 [IU] via SUBCUTANEOUS
  Filled 2020-12-06 (×2): qty 0.16

## 2020-12-06 MED ORDER — METFORMIN HCL 500 MG PO TABS
500.0000 mg | ORAL_TABLET | Freq: Two times a day (BID) | ORAL | Status: DC
  Administered 2020-12-06 – 2020-12-07 (×2): 500 mg via ORAL
  Filled 2020-12-06 (×2): qty 1

## 2020-12-06 MED ORDER — K PHOS MONO-SOD PHOS DI & MONO 155-852-130 MG PO TABS
500.0000 mg | ORAL_TABLET | Freq: Three times a day (TID) | ORAL | Status: AC
  Administered 2020-12-06 (×2): 500 mg via ORAL
  Filled 2020-12-06 (×2): qty 2

## 2020-12-06 MED ORDER — INSULIN STARTER KIT- PEN NEEDLES (SPANISH)
1.0000 | Freq: Once | Status: AC
  Administered 2020-12-06: 1
  Filled 2020-12-06: qty 1

## 2020-12-06 MED ORDER — INSULIN ASPART 100 UNIT/ML ~~LOC~~ SOLN
25.0000 [IU] | Freq: Once | SUBCUTANEOUS | Status: AC
  Administered 2020-12-06: 25 [IU] via SUBCUTANEOUS
  Filled 2020-12-06: qty 1

## 2020-12-06 MED ORDER — INSULIN ASPART 100 UNIT/ML ~~LOC~~ SOLN: 0.0000 [IU] | Freq: Every day | SUBCUTANEOUS | Status: DC

## 2020-12-06 NOTE — Progress Notes (Addendum)
Inpatient Diabetes Program Recommendations  AACE/ADA: New Consensus Statement on Inpatient Glycemic Control  Target Ranges:  Prepandial:   less than 140 mg/dL      Peak postprandial:   less than 180 mg/dL (1-2 hours)      Critically ill patients:  140 - 180 mg/dL  Results for BRAISON, SNOKE (MRN [id number]) as of 12/06/2020 13:15  Ref. Range 12/06/2020 11:41  Glucose-Capillary Latest Ref Range: 70 - 99 mg/dL 371 (H)   Results for KALMEN, LOLLAR (MRN [id number]) as of 12/06/2020 07:28  Ref. Range 12/05/2020 16:42 12/05/2020 18:31 12/05/2020 19:48 12/05/2020 20:53 12/05/2020 21:49 12/05/2020 23:04 12/06/2020 00:16 12/06/2020 01:40 12/06/2020 02:49 12/06/2020 04:05 12/06/2020 05:06 12/06/2020 06:15  Glucose-Capillary Latest Ref Range: 70 - 99 mg/dL 325 (H) 288 (H) 223 (H) 235 (H) 239 (H) 212 (H) 204 (H) 208 (H) 199 (H) 172 (H) 193 (H) 191 (H)  Results for SHELVY, PERAZZO (MRN [id number]) as of 12/06/2020 07:28  Ref. Range 12/05/2020 14:36  CO2 Latest Ref Range: 22 - 32 mmol/L 11 (L)  Glucose Latest Ref Range: 70 - 99 mg/dL 398 (H)  Anion gap Latest Ref Range: 5 - 15  16 (H)  Results for FALCON, MCCASKEY (MRN [id number]) as of 12/06/2020 07:28  Ref. Range 12/05/2020 16:37  Beta-Hydroxybutyric Acid Latest Ref Range: 0.05 - 0.27 mmol/L >8.00 (H)  Glucose Latest Ref Range: 70 - 99 mg/dL 326 (H)  Hemoglobin A1C Latest Ref Range: 4.8 - 5.6 % 11.5 (H)   Review of Glycemic Control  Diabetes history: NO Outpatient Diabetes medications: NA Current orders for Inpatient glycemic control: IV insulin, Levemir 16 units Q24H, Novolog 0-9 units TID with meals, Novolog 0-5 units QHS  Inpatient Diabetes Program Recommendations:    Insulin: Please consider ordering Novolog 4 units TID with meals for meal coverage if patient eats at least 50% of meals. If patient is going to need basal and meal coverage insulin, may want to consider changing to 70/30 insulin to decrease injections to twice a day.    Labs: Please consider ordering C-Peptide and GAD antibodies.  NOTE: Noted consult for diabetes coordinator. Chart reviewed. Per chart, not prior DM hx but noted family DM hx. Initial lab glucose 398 mg/dl on 12/05/20 at 14:36 in DKA which was treated with IV insulin. Noted patient received Levemir 16 units at 6:46 am today. Ordered Living Well with DM book (Spanish), Insulin starter kit (Spanish), RD consult for diet education, TOC consult to assist with follow up, medication needs, and to provide glucose monitoring kit if available, and patient education by bedside RNs. Will plan to talk with patient today.  Addendum 12/06/20@12 :55-Spoke with patient and family about new diabetes diagnosis with on campus interpreter.  Patient reports that he has no DM hx but notes his mother has DM and takes pills and insulin for DM control. Discussed A1C results (11.5% on 12/05/20) and explained what an A1C is and informed patient that his current A1C indicates an average glucose of 283 mg/dl over the past 2-3 months. Discussed basic pathophysiology of DM Type 1 and 2, basic home care, importance of checking CBGs and maintaining good CBG control to prevent long-term and short-term complications. Reviewed glucose and A1C goals.  Reviewed signs and symptoms of hyperglycemia and hypoglycemia along with treatment for both. Discussed impact of nutrition, exercise, stress, sickness, and medications on diabetes control. Discussed Carb Modified diet and informed patient that RD is consulted and should be talking with him further regarding Carb Modified diet.  Reviewed Living Well with diabetes booklet and encouraged patient to read through entire book. Informed patient that he will be prescribed insulin and Metformin at time of discharged.  Discussed Lantus and Novolog insulin as prescribed and how each insulin works.  While talking with patient his glucose was checked and it was 371 mg/dl at 11:41. Explained that when patients are  insulin niave that insulin is started using low dose and it has to be adjusted based on glucose. Explained that he likely will need more insulin; perhaps meal coverage insulin.  Asked patient to check his glucose 4 times per day (before meals and at bedtime) and to keep a log book of glucose readings and insulin taken. Explained how the doctor he follows up with can use the log book to continue to make insulin adjustments if needed. Reviewed and demonstrated how to draw up and administer insulin with vial and syringe and how to use an insulin pen. Patient was able to successfully demonstrate how to draw up and administer insulin with vial and syringe and how to use an insulin pen. Patient reports that he would prefer to use vial/syringe for insulin administration. Informed patient that TOC was consulted and should provide glucose monitoring kit if available, assist with follow up and with medication needs.   Informed patient that RN will be asking him to self-administer insulin to ensure proper technique and ability to administer self insulin shots.   Patient verbalized understanding of information discussed and he states that he has no further questions at this time related to diabetes.   RNs to provide ongoing basic DM education at bedside with this patient and engage patient to actively check blood glucose and administer insulin injections.   Thanks, Joe Alderman, RN, MSN, CDE Diabetes Coordinator Inpatient Diabetes Program [phone] (Team Pager from 8am to 5pm)

## 2020-12-06 NOTE — Progress Notes (Signed)
PHARMACY CONSULT NOTE  Pharmacy Consult for Electrolyte Monitoring and Replacement   Recent Labs: Potassium (mmol/L)  Date Value  12/06/2020 3.3 (L)   Calcium (mg/dL)  Date Value  30/05/2329 8.4 (L)   Albumin (g/dL)  Date Value  07/62/2633 4.5   Sodium (mmol/L)  Date Value  12/06/2020 132 (L)   Add-On magnesium: 1.7 mg/dL Add-On phosphorous: 2.0 mmol/L  Assessment:29 y.o. male with no significant past medical history who was seen at the urgent care center today some weakness weight loss increased thirst and urination, admitted with DKA  Goal of Therapy:  Electrolytes WNL  Plan:   40 mEq oral KCl x 1 per MD  500 mg K-Phos Neutral x 2 (each dose contains 16 mmol phosphorous and 2.2 mEq potassium)  2 grams IV magnesium sulfate x 1  Re-check electrolytes in am  Lowella Bandy ,PharmD Clinical Pharmacist 12/06/2020 7:27 AM

## 2020-12-06 NOTE — Progress Notes (Signed)
[facility]  - [place] [facility]   PATIENT NAME: Joe Kelly    MR#:  [id number]  DATE OF BIRTH:  [birth year]  SUBJECTIVE:  came in with increased thirst, increased urination and weight loss. Found to be in DKA transitioned off insulin drip. Tolerating PO diet.  Family at bedside.  REVIEW OF SYSTEMS:   Review of Systems  Constitutional: Negative for chills, fever and weight loss.  HENT: Negative for ear discharge, ear pain and nosebleeds.   Eyes: Negative for blurred vision, pain and discharge.  Respiratory: Negative for sputum production, shortness of breath, wheezing and stridor.   Cardiovascular: Negative for chest pain, palpitations, orthopnea and PND.  Gastrointestinal: Negative for abdominal pain, diarrhea, nausea and vomiting.  Genitourinary: Negative for frequency and urgency.  Musculoskeletal: Negative for back pain and joint pain.  Neurological: Negative for sensory change, speech change, focal weakness and weakness.  Psychiatric/Behavioral: Negative for depression and hallucinations. The patient is not nervous/anxious.    Tolerating Diet:yes  DRUG ALLERGIES:   Allergies  Allergen Reactions  . Coconut Flavor     VITALS:  Blood pressure 128/72, pulse 97, temperature 98.5 F (36.9 C), temperature source Oral, resp. rate 19, height 5' (1.524 m), weight 51.7 kg, SpO2 97 %.  PHYSICAL EXAMINATION:   Physical Exam  GENERAL:  30 y.o.-year-old patient lying in the bed with no acute distress.  LUNGS: Normal breath sounds bilaterally, no wheezing, rales, rhonchi. No use of accessory muscles of respiration.  CARDIOVASCULAR: S1, S2 normal. No murmurs, rubs, or gallops.  ABDOMEN: Soft, nontender, nondistended. Bowel sounds present. No organomegaly or mass.  EXTREMITIES: No cyanosis, clubbing or edema b/l.    NEUROLOGIC:  Non focal PSYCHIATRIC:  patient is alert and oriented x 3.  SKIN: No obvious rash, lesion, or ulcer.   LABORATORY  PANEL:  CBC Recent Labs  Lab 12/05/20 2127  WBC 6.0  HGB 13.8  HCT 39.3  PLT 301    Chemistries  Recent Labs  Lab 12/05/20 1637 12/05/20 2127 12/06/20 0550  NA 126*   < > 132*  K 4.0   < > 3.3*  CL 98   < > 106  CO2 11*   < > 20*  GLUCOSE 326*   < > 190*  BUN 15   < > 10  CREATININE 0.88   < > 0.55*  CALCIUM 9.0   < > 8.4*  MG  --   --  1.7  AST 20  --   --   ALT 43  --   --   ALKPHOS 122  --   --   BILITOT 1.9*  --   --    < > = values in this interval not displayed.   Cardiac Enzymes No results for input(s): TROPONINI in the last 168 hours. RADIOLOGY:  DG Chest 2 View  Result Date: 12/05/2020 CLINICAL DATA:  Shortness of breath EXAM: CHEST - 2 VIEW COMPARISON:  None. FINDINGS: The heart size and mediastinal contours are within normal limits. Both lungs are clear. The visualized skeletal structures are unremarkable. IMPRESSION: Negative. Electronically Signed   By: Charlett Nose M.D.   On: 12/05/2020 18:32   ASSESSMENT AND PLAN:   Joe Kelly is a 30 y.o. male with medical history significant of no significant past medical history who was seen at the urgent care center today some weakness weight loss increased thirst and urination.  He was diagnosed with diabetes due to elevated blood sugar.  Patient  sent to the ER and being evaluated as an newly diagnosed diabetes.  #1 diabetic ketoacidosis: In the newly diagnosed diabetes likely type 2   -- patient received aggressive IV fluids and was on insulin drip -- anion gap closed -- check C-peptide and GAD antibody -- transition to Lantus and sliding scale, start metformin 500 BID -- diabetes coordinator input appreciated -- A1c 11.5 -- [facility] -- TOC for discharge planning-- [facility] and glucometer  #2 electrolyte abnormality -- pharmacy to replete potassium and phosphorus  #3 dehydration: As the results of DKA.   -- received IV fluids. Tolerating PO  liquids and diet  #4 pseudohyponatremia: Due to elevated glucose.  Expected to get better.  Overall improving. Transfer out of ICU   DVT prophylaxis: Lovenox Code Status: Full code Family Communication: Family at bedside Disposition Plan: Home Consults called: None Admission status: Inpatient  Level of care: Med-Surg   Remains inpatient appropriate because:Inpatient level of care appropriate due to severity of illness   Dispo: The patient is from: Home              Anticipated d/c is to: Home              Patient currently is not medically stable to d/c.   Difficult to place patient No  Transfer out of ICU. Replete electrolyte. Will monitor sugars overnight likely discharge tomorrow. Patient  In agreement      TOTAL TIME TAKING CARE OF THIS PATIENT: 30 minutes.  >50% time spent on counselling and coordination of care  Note: This dictation was prepared with Dragon dictation along with smaller phrase technology. Any transcriptional errors that result from this process are unintentional.  Enedina Finner M.D    [facility]   CC: Primary care physician; Patient, No Pcp Per (Inactive)Patient ID: Joe Kelly, male   DOB: 01-06-1991, 30 y.o.   MRN: [id number]

## 2020-12-07 ENCOUNTER — Other Ambulatory Visit: Payer: Self-pay

## 2020-12-07 LAB — BASIC METABOLIC PANEL
Anion gap: 12 (ref 5–15)
BUN: 8 mg/dL (ref 6–20)
CO2: 25 mmol/L (ref 22–32)
Calcium: 8.7 mg/dL — ABNORMAL LOW (ref 8.9–10.3)
Chloride: 99 mmol/L (ref 98–111)
Creatinine, Ser: 0.47 mg/dL — ABNORMAL LOW (ref 0.61–1.24)
GFR, Estimated: >60 mL/min (ref >60)
Glucose, Bld: 97 mg/dL (ref 70–99)
Potassium: 2.7 mmol/L — CL (ref 3.5–5.1)
Sodium: 136 mmol/L (ref 135–145)

## 2020-12-07 LAB — GLUCOSE, CAPILLARY
Glucose-Capillary: 115 mg/dL — ABNORMAL HIGH (ref 70–99)
Glucose-Capillary: 127 mg/dL — ABNORMAL HIGH (ref 70–99)
Glucose-Capillary: 231 mg/dL — ABNORMAL HIGH (ref 70–99)

## 2020-12-07 LAB — MAGNESIUM: Magnesium: 1.9 mg/dL (ref 1.7–2.4)

## 2020-12-07 LAB — PHOSPHORUS: Phosphorus: 4.4 mg/dL (ref 2.5–4.6)

## 2020-12-07 LAB — C-PEPTIDE: C-Peptide: 0.6 ng/mL — ABNORMAL LOW (ref 1.1–4.4)

## 2020-12-07 LAB — GLUTAMIC ACID DECARBOXYLASE AUTO ABS: Glutamic Acid Decarb Ab: <5 U/mL (ref 0.0–5.0)

## 2020-12-07 MED ORDER — HUMULIN 70/30 (70-30) 100 UNIT/ML ~~LOC~~ SUSP
13.0000 [IU] | Freq: Two times a day (BID) | SUBCUTANEOUS | 10 refills | Status: DC
  Filled 2020-12-07: qty 10, 31d supply, fill #0

## 2020-12-07 MED ORDER — INSULIN ASPART PROT & ASPART (70-30 MIX) 100 UNIT/ML ~~LOC~~ SUSP: 13.0000 [IU] | Freq: Two times a day (BID) | SUBCUTANEOUS | Status: DC

## 2020-12-07 MED ORDER — METFORMIN HCL 500 MG PO TABS
500.0000 mg | ORAL_TABLET | Freq: Two times a day (BID) | ORAL | 3 refills | Status: DC
Start: 2020-12-07 — End: 2021-01-17
  Filled 2020-12-07: qty 60, 30d supply, fill #0

## 2020-12-07 MED ORDER — HUMALOG 100 UNIT/ML ~~LOC~~ SOCT: 0.0000 [IU] | Freq: Three times a day (TID) | SUBCUTANEOUS | 11 refills | Status: DC

## 2020-12-07 MED ORDER — "BD VEO INSULIN SYRINGE U/F 31G X 15/64"" 0.5 ML MISC"
1.0000 "application " | Freq: Three times a day (TID) | 0 refills | Status: DC
  Filled 2020-12-07: qty 100, 30d supply, fill #0

## 2020-12-07 MED ORDER — HUMULIN 70/30 (70-30) 100 UNIT/ML ~~LOC~~ SUSP: 13.0000 [IU] | Freq: Two times a day (BID) | SUBCUTANEOUS | 11 refills | Status: DC

## 2020-12-07 MED ORDER — INSULIN SYRINGES (DISPOSABLE) U-100 0.5 ML MISC: 1.0000 "application " | Freq: Three times a day (TID) | 0 refills | Status: DC

## 2020-12-07 MED ORDER — METFORMIN HCL 500 MG PO TABS: 500.0000 mg | ORAL_TABLET | Freq: Two times a day (BID) | ORAL | 3 refills | Status: DC

## 2020-12-07 MED ORDER — POTASSIUM CHLORIDE CRYS ER 20 MEQ PO TBCR
40.0000 meq | EXTENDED_RELEASE_TABLET | ORAL | Status: AC
  Administered 2020-12-07 (×2): 40 meq via ORAL
  Filled 2020-12-07 (×2): qty 2

## 2020-12-07 MED ORDER — INSULIN ASPART PROT & ASPART (70-30 MIX) 100 UNIT/ML ~~LOC~~ SUSP
13.0000 [IU] | Freq: Two times a day (BID) | SUBCUTANEOUS | Status: DC
  Administered 2020-12-07: 13 [IU] via SUBCUTANEOUS
  Filled 2020-12-07: qty 10

## 2020-12-07 NOTE — Discharge Instructions (Signed)
C-peptide 0.6 ng/ml. Please check My-Chart for GAD antibody labs. Ask provider about the results at time of follow up as well.

## 2020-12-07 NOTE — Progress Notes (Signed)
Joe Kelly to be D/C'd Home per MD order.  Discussed prescriptions and follow up appointments with the patient.  medication list explained in detail. Pt verbalized understanding. Pt newly diagnosed with DM, pt educated on diet, exercise, monitor blood sugar check, and self administer insulin. Pt stated the nurses have been teaching him to self administer insulin since yesterday.   IV catheter discontinued intact. Site without signs and symptoms of complications. Dressing and pressure applied. Pt denies pain at this time. No complaints noted.  An After Visit Summary was printed and given to the patient. Pt awaiting medications to be delivered to room.  Joe Kelly

## 2020-12-07 NOTE — TOC Initial Note (Signed)
Transition of Care [facility]) - Initial/Assessment Note    Patient Details  Name: Joe Kelly MRN: [id number] Date of Birth: 1991-03-10  Transition of Care [facility]) CM/SW Contact:    Chapman Fitch, RN Phone Number: 12/07/2020, 3:50 PM  Clinical Narrative:                 Patient admitted from home with DKA.  Assessment completed with spanish interpreter.  Patient lives at home with family  Patient states that he is employed, however uninsured  Patient to discharge today Patient confirms he has reliable transportation     Medications filled at [facility]  And delivered to patient at bedside.  Provided patient with charity glucometer  Patient does not have PCP.  Provided patient with application to [facility] and [facility] .  Patient is aware that in order to continue receiving his medication he will need to complete the applications and get established at [facility]    Patient Goals and CMS Choice        Expected Discharge Plan and Services           Expected Discharge Date: 12/07/20                                    Prior Living Arrangements/Services                       Activities of Daily Living      Permission Sought/Granted                  Emotional Assessment              Admission diagnosis:  DKA (diabetic ketoacidosis) (HCC) [E11.10] Diabetic ketoacidosis without coma associated with other specified diabetes mellitus (HCC) [E13.10] Patient Active Problem List   Diagnosis Date Noted  . Hypokalemia   . DKA (diabetic ketoacidosis) (HCC) 12/05/2020  . Hyponatremia 12/05/2020  . Dehydration 12/05/2020   PCP:  Patient, No Pcp Per (Inactive) Pharmacy:   [id number] [place] - [address] AT [facility] OF [address] [place] [address] Phone: [phone] Fax: [phone]  [facility] #[id number] [place], [place] - [address] AT  [facility] OF [address] [place] [address] [place] [address] Phone: [phone] Fax: [phone]  [facility] [address], [address] [place] [address] Phone: [phone] Fax: [phone]     Social Determinants of Health (SDOH) Interventions    Readmission Risk Interventions No flowsheet data found.

## 2020-12-07 NOTE — Progress Notes (Signed)
Potassium 2.7.; Dr Allena Katz notified. Order placed for potassium

## 2020-12-07 NOTE — Discharge Summary (Signed)
[facility] [place] [facility]   PATIENT NAME: Joe Kelly    MR#:  [id number]  DATE OF BIRTH:  October 12, 1990  DATE OF ADMISSION:  12/05/2020 ADMITTING PHYSICIAN: Rometta EmeryMohammad L Garba, MD  DATE OF DISCHARGE: 12/07/2020  PRIMARY CARE PHYSICIAN: Patient, No Pcp Per (Inactive)    ADMISSION DIAGNOSIS:  DKA (diabetic ketoacidosis) (HCC) [E11.10] Diabetic ketoacidosis without coma associated with other specified diabetes mellitus (HCC) [E13.10]  DISCHARGE DIAGNOSIS:  DKA-Type 2 Hpokalemia  SECONDARY DIAGNOSIS:   Past Medical History:  Diagnosis Date  . Diabetes mellitus without complication [facility](HCC)     HOSPITAL COURSE:   Joe LineaCesar Hernandez Arciniegais a 30 y.o.malewith medical history significant ofno significant past medical history who was seen at the urgent care center today some weakness weight loss increased thirst and urination. He was diagnosed with diabetes due to elevated blood sugar. Patient sent to the ER and being evaluated as an newly diagnosed diabetes.  #1 diabetic ketoacidosis:In the newly diagnosed diabetes likely type 2  -- patient received aggressive IV fluids and was on insulin drip -- anion gap closed -- check C-peptide and GAD antibody -- transition to Lantus and sliding scale, start metformin 500 BID -- diabetes coordinator input appreciated -- A1c 11.5 -- patient will need to [facility] -- TOC for discharge planning-- [facility] and [facility]cometer  #2 electrolyte abnormality -- pharmacy to replete potassium and phosphorus --ith K of 2.7-- pt got 80 meq KCL today  #3 dehydration:As the results of DKA.  -- received IV fluids. Tolerating PO liquids and diet  #4 pseudohyponatremia:resolved Na 136.  Overall improving.  Dm education done Pt will d/c home Spoke with wife and friend in the room   DVT prophylaxis:Lovenox Code Status:Full code Family Communication:Family at  bedside Disposition Plan:Home Consults called:None Admission status:Inpatient  D/c home  CONSULTS OBTAINED:    DRUG ALLERGIES:   Allergies  Allergen Reactions  . Coconut Flavor     Dizziness     DISCHARGE MEDICATIONS:   Allergies as of 12/07/2020      Reactions   Coconut Flavor    Dizziness       Medication List    TAKE these medications   acetaminophen 325 MG tablet Commonly known as: TYLENOL Take 650 mg by mouth every 6 (six) hours as needed for moderate pain or headache.   HumaLOG 100 UNIT/ML cartridge Generic drug: insulin lispro Inject 0-0.09 mLs (0-9 Units total) into the skin 3 (three) times daily with meals. Correction coverage: Sensitive (thin, NPO, renal)  CBG < 70: Implement Hypoglycemia Standing Orders and refer to Hypoglycemia Standing Orders sidebar report  CBG 70 - 120: 0 units  CBG 121 - 150: 1 unit  CBG 151 - 200: 2 units  CBG 201 - 250: 3 units  CBG 251 - 300: 5 units  CBG 301 - 350: 7 units  CBG 351 - 400 9 units  CBG > 400 call MD and obtain STAT lab verification  Correction coverage: HS scale  CBG < 70: Implement Hypoglycemia Standing Orders and refer to Hypoglycemia Standing Orders sidebar report  CBG 70 - 120: 0 units  CBG 121 - 150: 0 units  CBG 151 - 200: 0 units  CBG 201 - 250: 2 units  CBG 251 - 300: 3 units  CBG 301 - 350: 4 units  CBG 351 - 400: 5 units  CBG > 400 call MD and obtain STAT lab verification   HumuLIN 70/30 (70-30)  100 UNIT/ML injection Generic drug: insulin NPH-regular Human Inject 13 Units into the skin 2 (two) times daily with a meal.   Insulin Syringes (Disposable) U-100 0.5 ML Misc 1 application by Does not apply route 4 (four) times daily - after meals and at bedtime.   metFORMIN 500 MG tablet Commonly known as: GLUCOPHAGE Take 1 tablet (500 mg total) by mouth 2 (two) times daily with a meal.            Durable Medical Equipment  (From admission, onward)         Start     Ordered    12/07/20 1324  DME Glucometer  Once        12/07/20 1323          If you experience worsening of your admission symptoms, develop shortness of breath, life threatening emergency, suicidal or homicidal thoughts you must seek medical attention immediately by calling 911 or calling your MD immediately  if symptoms less severe.  You Must read complete instructions/literature along with all the possible adverse reactions/side effects for all the Medicines you take and that have been prescribed to you. Take any new Medicines after you have completely understood and accept all the possible adverse reactions/side effects.   Please note  You were cared for by a hospitalist during your hospital stay. If you have any questions about your discharge medications or the care you received while you were in the hospital after you are discharged, you can call the unit and asked to speak with the hospitalist on call if the hospitalist that took care of you is not available. Once you are discharged, your primary care physician will handle any further medical issues. Please note that NO REFILLS for any discharge medications will be authorized once you are discharged, as it is imperative that you return to your primary care physician (or establish a relationship with a primary care physician if you do not have one) for your aftercare needs so that they can reassess your need for medications and monitor your lab values. Today   SUBJECTIVE   Doing well  VITAL SIGNS:  Blood pressure 113/77, pulse 91, temperature (!) 97.3 F (36.3 C), temperature source Oral, resp. rate 18, height 5' (1.524 m), weight 51.7 kg, SpO2 99 %.  I/O:    Intake/Output Summary (Last 24 hours) at 12/07/2020 1325 Last data filed at 12/07/2020 1011 Gross per 24 hour  Intake 240 ml  Output --  Net 240 ml    PHYSICAL EXAMINATION:  GENERAL:  30 y.o.-year-old patient lying in the bed with no acute distress.  EYES: Pupils equal, round,  reactive to light and accommodation. No scleral icterus.  HEENT: Head atraumatic, normocephalic. Oropharynx and nasopharynx clear.  NECK:  Supple, no jugular venous distention. No thyroid enlargement, no tenderness.  LUNGS: Normal breath sounds bilaterally, no wheezing, rales,rhonchi or crepitation. No use of accessory muscles of respiration.  CARDIOVASCULAR: S1, S2 normal. No murmurs, rubs, or gallops.  ABDOMEN: Soft, non-tender, non-distended. Bowel sounds present. No organomegaly or mass.  EXTREMITIES: No pedal edema, cyanosis, or clubbing.  NEUROLOGIC: Cranial nerves II through XII are intact. Muscle strength 5/5 in all extremities. Sensation intact. Gait not checked.  PSYCHIATRIC: The patient is alert and oriented x 3.  SKIN: No obvious rash, lesion, or ulcer.   DATA REVIEW:   CBC  Recent Labs  Lab 12/05/20 2127  WBC 6.0  HGB 13.8  HCT 39.3  PLT 301    Chemistries  Recent  Labs  Lab 12/05/20 1637 12/05/20 2127 12/07/20 0621  NA 126*   < > 136  K 4.0   < > 2.7*  CL 98   < > 99  CO2 11*   < > 25  GLUCOSE 326*   < > 97  BUN 15   < > 8  CREATININE 0.88   < > 0.47*  CALCIUM 9.0   < > 8.7*  MG  --    < > 1.9  AST 20  --   --   ALT 43  --   --   ALKPHOS 122  --   --   BILITOT 1.9*  --   --    < > = values in this interval not displayed.    Microbiology Results   Recent Results (from the past 240 hour(s))  Resp Panel by RT-PCR (Flu A&B, Covid) Nasopharyngeal Swab     Status: None   Collection Time: 12/05/20  5:49 PM   Specimen: Nasopharyngeal Swab; Nasopharyngeal(NP) swabs in vial transport medium  Result Value Ref Range Status   SARS Coronavirus 2 by RT PCR NEGATIVE NEGATIVE Final    Comment: (NOTE) SARS-CoV-2 target nucleic acids are NOT DETECTED.  The SARS-CoV-2 RNA is generally detectable in upper respiratory specimens during the acute phase of infection. The lowest concentration of SARS-CoV-2 viral copies this assay can detect is 138 copies/mL. A negative  result does not preclude SARS-Cov-2 infection and should not be used as the sole basis for treatment or other patient management decisions. A negative result may occur with  improper specimen collection/handling, submission of specimen other than nasopharyngeal swab, presence of viral mutation(s) within the areas targeted by this assay, and inadequate number of viral copies(<138 copies/mL). A negative result must be combined with clinical observations, patient history, and epidemiological information. The expected result is Negative.  Fact Sheet for Patients:  BloggerCourse.com  Fact Sheet for Healthcare Providers:  SeriousBroker.it  This test is no t yet approved or cleared by the [place] FDA and  has been authorized for detection and/or diagnosis of SARS-CoV-2 by FDA under an Emergency Use Authorization (EUA). This EUA will remain  in effect (meaning this test can be used) for the duration of the COVID-19 declaration under Section 564(b)(1) of the Act, 21 U.S.C.section 360bbb-3(b)(1), unless the authorization is terminated  or revoked sooner.       Influenza A by PCR NEGATIVE NEGATIVE Final   Influenza B by PCR NEGATIVE NEGATIVE Final    Comment: (NOTE) The Xpert Xpress SARS-CoV-2/FLU/RSV plus assay is intended as an aid in the diagnosis of influenza from Nasopharyngeal swab specimens and should not be used as a sole basis for treatment. Nasal washings and aspirates are unacceptable for Xpert Xpress SARS-CoV-2/FLU/RSV testing.  Fact Sheet for Patients: BloggerCourse.com  Fact Sheet for Healthcare Providers: SeriousBroker.it  This test is not yet approved or cleared by the [place] FDA and has been authorized for detection and/or diagnosis of SARS-CoV-2 by FDA under an Emergency Use Authorization (EUA). This EUA will remain in effect (meaning this test can be used)  for the duration of the COVID-19 declaration under Section 564(b)(1) of the Act, 21 U.S.C. section 360bbb-3(b)(1), unless the authorization is terminated or revoked.  Performed at [facility], [address], [place], [place] [address]   MRSA PCR Screening     Status: None   Collection Time: 12/05/20  9:27 PM   Specimen: Nasopharyngeal  Result Value Ref Range Status  MRSA by PCR NEGATIVE NEGATIVE Final    Comment:        The GeneXpert MRSA Assay (FDA approved for NASAL specimens only), is one component of a comprehensive MRSA colonization surveillance program. It is not intended to diagnose MRSA infection nor to guide or monitor treatment for MRSA infections. Performed at [facility], [address] [place], [place] [address]     RADIOLOGY:  DG Chest 2 View  Result Date: 12/05/2020 CLINICAL DATA:  Shortness of breath EXAM: CHEST - 2 VIEW COMPARISON:  None. FINDINGS: The heart size and mediastinal contours are within normal limits. Both lungs are clear. The visualized skeletal structures are unremarkable. IMPRESSION: Negative. Electronically Signed   By: Charlett Nose M.D.   On: 12/05/2020 18:32     CODE STATUS:     Code Status Orders  (From admission, onward)         Start     Ordered   12/05/20 2119  Full code  Continuous        12/05/20 2118        Code Status History    This patient has a current code status but no historical code status.   Advance Care Planning Activity       TOTAL TIME TAKING CARE OF THIS PATIENT: *40* minutes.    Enedina Finner M.D  [facility]    CC: Primary care physician; Patient, No Pcp Per (Inactive)

## 2020-12-07 NOTE — Progress Notes (Signed)
Inpatient Diabetes Program Recommendations  AACE/ADA: New Consensus Statement on Inpatient Glycemic Control  Target Ranges:  Prepandial:   less than 140 mg/dL      Peak postprandial:   less than 180 mg/dL (1-2 hours)      Critically ill patients:  140 - 180 mg/dL  Results for Joe Kelly, Joe Kelly (MRN [id number]) as of 12/07/2020 06:53  Ref. Range 12/07/2020 06:45  Glucose-Capillary Latest Ref Range: 70 - 99 mg/dL 016 (H)   Results for Joe Kelly, Joe Kelly (MRN [id number]) as of 12/07/2020 06:40  Ref. Range 12/06/2020 06:15 12/06/2020 11:41 12/06/2020 17:53 12/06/2020 21:25  Glucose-Capillary Latest Ref Range: 70 - 99 mg/dL 732 (H) 202 (H) 542 (H) 101 (H)   Results for Joe Kelly, Joe Kelly (MRN [id number]) as of 12/07/2020 06:53  Ref. Range 12/06/2020 13:05  C-Peptide Latest Ref Range: 1.1 - 4.4 ng/mL 0.6 (L)   Review of Glycemic Control  Diabetes history: NO Outpatient Diabetes medications: NA Current orders for Inpatient glycemic control: Levemir 16 units Q24H, Novolog 0-9 units TID with meals, Novolog 0-5 units QHS, Metformin 500 mg BID  Inpatient Diabetes Program Recommendations:    Insulin:  Please consider discontinuing Levemir and ordering 70/30 13 units BID (dose will provide a total of 18.2 units for basal and 7.8 units for meal coverage per day).   Outpatient: C-peptide 0.6 ng/mL on 12/06/20 indicating patient is not making adequate amount insulin. At time of discharge please provide Rx for:  Humulin 70/30 vials (#[id number]), Humalog vials (#[id number]), and insulin syringes (#[id number]).  NOTE: Patient transition from IV to SQ insulin yesterday and received Levemir 16 units at 6:46 am. Glucose noted to be 371 mg/dl before lunch and 269 mg/dl before supper. Patient will need basal, meal coverage, and correction insulin outpatient. Would recommend transitioning to 70/30 insulin BID and Novolog correction scale. Patient will be getting discharge medications from [facility] and they  have Humulin 70/30 vials (pt prefers vials) and Humalog vials. Therefore, would discharge on 70/30 13 units BID along with Humalog 0-9 units TID and Humualog 0-5 units QHS. Patient will need to follow up as soon as possible to clinic for further guidance on insulin adjustments.  Thanks, Orlando Penner, RN, MSN, CDE Diabetes Coordinator Inpatient Diabetes Program [phone] (Team Pager from 8am to 5pm)

## 2020-12-07 NOTE — Plan of Care (Signed)
Nutrition Education Note   RD consulted for nutrition education regarding diabetes.   Education provided today with spanish interpreter  30 y/o male admitted with new DM and DKA   Lab Results  Component Value Date   HGBA1C 11.7 (H) 12/05/2020    RD provided "Nutrition and Type II Diabetes" handout from the Academy of Nutrition and Dietetics in Spanish. Discussed different food groups and their effects on blood sugar, emphasizing carbohydrate-containing foods. Provided list of carbohydrates and recommended serving sizes of common foods.  Discussed importance of controlled and consistent carbohydrate intake throughout the day. Provided examples of ways to balance meals/snacks and encouraged intake of high-fiber, whole grain complex carbohydrates. Teach back method used.  Expect good compliance.  Body mass index is 22.26 kg/m. Pt meets criteria for normal weight based on current BMI.  Current diet order is CHO modified, patient is consuming approximately 100% of meals at this time. Labs and medications reviewed. No further nutrition interventions warranted at this time. RD contact information provided. If additional nutrition issues arise, please re-consult RD.  Betsey Holiday MS, RD, LDN Please refer to [facility] for RD and/or RD on-call/weekend/after hours pager

## 2020-12-07 NOTE — Progress Notes (Signed)
PHARMACY CONSULT NOTE  Pharmacy Consult for Electrolyte Monitoring and Replacement   Recent Labs: Potassium (mmol/L)  Date Value  12/07/2020 2.7 (LL)   Magnesium (mg/dL)  Date Value  54/05/8118 1.9   Calcium (mg/dL)  Date Value  14/78/2956 8.7 (L)   Albumin (g/dL)  Date Value  21/30/8657 4.5   Phosphorus (mg/dL)  Date Value  84/69/6295 4.4   Sodium (mmol/L)  Date Value  12/07/2020 136    Assessment:29 y.o. male with no significant past medical history who was seen at the urgent care center today some weakness weight loss increased thirst and urination, admitted with DKA  Goal of Therapy:  Electrolytes WNL  Plan:  Potassium 40 mEq PO x 2 doses  Pricilla Riffle ,PharmD Clinical Pharmacist 12/07/2020 9:12 AM

## 2020-12-09 NOTE — ED Provider Notes (Signed)
[facility]    CSN: [id number] Arrival date & time: 12/05/20  1318      History   Chief Complaint Chief Complaint  Patient presents with  . Dizziness    HPI Joe Kelly is a 30 y.o. male.   Patient is a pleasant 30 year old male with no significant past medical history who presents to the urgent care with 1 week of polyuria, polydipsia, decreased appetite, 10 to 15 pounds of weight loss, generalized fatigue, and lightheadedness.  No fever shakes chills.  He feels nauseous at times but no vomiting or diarrhea.  He reports no changes in activity level recently.  No recent stressors.  He has no history of thyroid disease.  No sore throat or cough.  No Covid-like symptoms.  No chest pain or shortness of breath.     Past Medical History:  Diagnosis Date  . Diabetes mellitus without complication [facility])     Patient Active Problem List   Diagnosis Date Noted  . Hypokalemia   . DKA (diabetic ketoacidosis) (HCC) 12/05/2020  . Hyponatremia 12/05/2020  . Dehydration 12/05/2020    History reviewed. No pertinent surgical history.     Home Medications     Family History No family history on file.  Social History Social History   Tobacco Use  . Smoking status: Never Smoker  . Smokeless tobacco: Never Used  Vaping Use  . Vaping Use: Never used  Substance Use Topics  . Alcohol use: Not Currently  . Drug use: Not Currently     Allergies   Coconut flavor   Review of Systems Review of Systems  Constitutional: Positive for activity change, appetite change and fatigue. Negative for chills, diaphoresis and fever.  HENT: Negative.  Negative for congestion, ear pain, rhinorrhea and sinus pain.   Eyes: Negative.  Negative for pain.  Respiratory: Negative.  Negative for cough, chest tightness and shortness of breath.   Cardiovascular: Negative.  Negative for chest pain and palpitations.  Gastrointestinal: Positive for abdominal pain. Negative for  constipation, diarrhea, nausea and vomiting.  Endocrine: Positive for polydipsia, polyphagia and polyuria.  Genitourinary: Positive for frequency. Negative for dysuria, flank pain and hematuria.  Musculoskeletal: Negative for back pain, gait problem, joint swelling, myalgias and neck pain.  Skin: Negative for color change, pallor, rash and wound.  Neurological: Positive for dizziness, weakness, light-headedness and headaches. Negative for seizures, syncope and speech difficulty.  All other systems reviewed and are negative.    Physical Exam Triage Vital Signs ED Triage Vitals  Enc Vitals Group     BP 12/05/20 1333 129/86     Pulse Rate 12/05/20 1333 (!) 105     Resp 12/05/20 1333 16     Temp 12/05/20 1333 98.1 F (36.7 C)     Temp Source 12/05/20 1333 Oral     SpO2 12/05/20 1333 100 %     Weight 12/05/20 1458 114 lb 3.2 oz (51.8 kg)     Height --      Head Circumference --      Peak Flow --      Pain Score 12/05/20 1333 4     Pain Loc --      Pain Edu? --      Excl. in GC? --    No data found.  Updated Vital Signs BP 129/86   Pulse (!) 105   Temp 98.1 F (36.7 C) (Oral)   Resp 16   Wt 51.8 kg   SpO2 100%  BMI 17.89 kg/m   Visual Acuity Right Eye Distance:   Left Eye Distance:   Bilateral Distance:    Right Eye Near:   Left Eye Near:    Bilateral Near:     Physical Exam Vitals and nursing note reviewed.  Constitutional:      General: He is not in acute distress.    Appearance: Normal appearance. He is ill-appearing. He is not toxic-appearing or diaphoretic.     Comments: Interpreter used  HENT:     Head: Normocephalic and atraumatic.     Right Ear: Tympanic membrane normal.     Left Ear: Tympanic membrane normal.     Nose: No congestion or rhinorrhea.     Mouth/Throat:     Mouth: Mucous membranes are dry.     Pharynx: No oropharyngeal exudate or posterior oropharyngeal erythema.  Eyes:     General: No visual field deficit or scleral icterus.        Right eye: No discharge.        Left eye: No discharge.     Extraocular Movements: Extraocular movements intact.     Conjunctiva/sclera: Conjunctivae normal.     Pupils: Pupils are equal, round, and reactive to light.  Cardiovascular:     Rate and Rhythm: Regular rhythm. Tachycardia present.     Pulses: Normal pulses.     Heart sounds: Normal heart sounds. No murmur heard. No friction rub. No gallop.   Pulmonary:     Effort: Pulmonary effort is normal. No respiratory distress.     Breath sounds: Normal breath sounds. No stridor. No wheezing, rhonchi or rales.  Abdominal:     General: Abdomen is flat. Bowel sounds are normal. There is no distension.     Palpations: Abdomen is soft. There is no mass.     Tenderness: There is generalized abdominal tenderness. There is no right CVA tenderness, left CVA tenderness, guarding or rebound.     Hernia: No hernia is present.  Musculoskeletal:     Cervical back: Normal range of motion and neck supple. No rigidity or tenderness.  Lymphadenopathy:     Cervical: No cervical adenopathy.  Skin:    General: Skin is warm and dry.     Capillary Refill: Capillary refill takes less than 2 seconds.     Coloration: Skin is not jaundiced.     Findings: No bruising, erythema, lesion or rash.  Neurological:     General: No focal deficit present.     Mental Status: He is alert and oriented to person, place, and time.     GCS: GCS eye subscore is 4. GCS verbal subscore is 5. GCS motor subscore is 6.     Cranial Nerves: Cranial nerves are intact.     Sensory: Sensation is intact.     Motor: Motor function is intact. No seizure activity.     Deep Tendon Reflexes: Reflexes are normal and symmetric.     Reflex Scores:      Tricep reflexes are 2+ on the right side and 2+ on the left side.      Bicep reflexes are 2+ on the right side and 2+ on the left side.      Brachioradialis reflexes are 2+ on the right side and 2+ on the left side.      Patellar reflexes  are 2+ on the right side and 2+ on the left side.      Achilles reflexes are 2+ on the right side and 2+ on  the left side. Psychiatric:        Mood and Affect: Mood normal.        Behavior: Behavior normal.      UC Treatments / Results  Labs (all labs ordered are listed, but only abnormal results are displayed) Labs Reviewed  URINALYSIS, COMPLETE (UACMP) WITH MICROSCOPIC - Abnormal; Notable for the following components:      Result Value   Glucose, UA 500 (*)    Hgb urine dipstick SMALL (*)    Ketones, ur >160 (*)    Protein, ur 30 (*)    All other components within normal limits  COMPREHENSIVE METABOLIC PANEL - Abnormal; Notable for the following components:   Sodium 124 (*)    Chloride 97 (*)    CO2 11 (*)    Glucose, Bld 398 (*)    Calcium 8.7 (*)    Total Bilirubin 1.3 (*)    Anion gap 16 (*)    All other components within normal limits  CBC WITH DIFFERENTIAL/PLATELET  TSH    EKG   Radiology No results found.  Procedures Procedures (including critical care time)  Medications Ordered in UC Medications - No data to display  Initial Impression / Assessment and Plan / UC Course  I have reviewed the triage vital signs and the nursing notes.  Pertinent labs & imaging results that were available during my care of the patient were reviewed by me and considered in my medical decision making (see chart for details).  Clinical impression: 7 to 10 days of the above symptoms with polyuria, polydipsia, weight loss, decreased appetite, and fatigue.  Concerning for new onset type 1 diabetes.  Treatment plan: 1.  The findings and treatment plan were discussed in detail with the patient and his friend who is interpreting.  All parties were in agreement and voiced verbal understanding. 2.  Recommended we get labs.  His blood glucose was 398 consistent with new onset type 1 diabetes.  He also had a relative hyponatremia.  Bicarb was also low.  Calcium was low bilirubin was high  and his anion gap was 16.  His chloride was also low.  Also did a UA which showed glucose of greater than 500 with a large amount of ketones and a small amount of protein.  CBC did not show any elevation in his white count.  H&H and platelet count were also within normal limits.  TSH was pending at the time of discharge.  I would asked the hospitalist to follow-up on this. 3.  I provided some educational handouts in Spanish. 4.  I recommended that he go straight to [facility] emergency department for admission and management of this new onset type 1 diabetes.  His friend will drive him directly there.  He was clinically stable on discharge and agreeable with this plan.    Final Clinical Impressions(s) / UC Diagnoses   Final diagnoses:  New onset type 1 diabetes mellitus, uncontrolled (HCC)  Loss of weight  Generalized abdominal pain  Polyuria  Polydipsia  Other fatigue     Discharge Instructions     You have new onset type 1 diabetes. Your labs also show that you have some electrolyte abnormalities and some abnormalities that will require you to go to the hospital and get IV insulin to correct. Please go to [facility] emergency department.  Your friend can drive you there.  You should not drive. I will call the hospital and let them know to expect you.   ED  Prescriptions    None     PDMP not reviewed this encounter.   Delton SeeBarnes, Michaelpaul Apo, MD 12/10/20 1136

## 2021-01-17 ENCOUNTER — Ambulatory Visit: Payer: Self-pay | Admitting: Adult Health

## 2021-01-17 ENCOUNTER — Other Ambulatory Visit: Payer: Self-pay

## 2021-01-17 ENCOUNTER — Encounter: Payer: Self-pay | Admitting: Adult Health

## 2021-01-17 VITALS — BP 109/69 | HR 89 | Temp 98.0°F | Ht 65.0 in | Wt 117.4 lb

## 2021-01-17 DIAGNOSIS — Z794 Long term (current) use of insulin: Secondary | ICD-10-CM

## 2021-01-17 DIAGNOSIS — E1165 Type 2 diabetes mellitus with hyperglycemia: Secondary | ICD-10-CM

## 2021-01-17 LAB — GLUCOSE, POCT (MANUAL RESULT ENTRY): POC Glucose: 587 mg/dl — AB (ref 70–99)

## 2021-01-17 MED ORDER — INSULIN LISPRO (1 UNIT DIAL) 100 UNIT/ML (KWIKPEN): 5.0000 [IU] | PEN_INJECTOR | Freq: Three times a day (TID) | SUBCUTANEOUS | 11 refills | Status: DC

## 2021-01-17 MED ORDER — BASAGLAR KWIKPEN 100 UNIT/ML ~~LOC~~ SOPN
10.0000 [IU] | PEN_INJECTOR | Freq: Every day | SUBCUTANEOUS | 1 refills | Status: DC
  Filled 2021-01-17: qty 15, 150d supply, fill #0
  Filled 2021-01-17: qty 100, fill #0
  Filled 2021-01-17: qty 15, 140d supply, fill #0

## 2021-01-17 MED ORDER — ACETAMINOPHEN 325 MG PO TABS
650.0000 mg | ORAL_TABLET | Freq: Four times a day (QID) | ORAL | 2 refills | Status: DC | PRN
  Filled 2021-01-17: qty 60, 8d supply, fill #0

## 2021-01-17 MED ORDER — BASAGLAR KWIKPEN 100 UNIT/ML ~~LOC~~ SOPN: 10.0000 [IU] | PEN_INJECTOR | Freq: Every day | SUBCUTANEOUS | 1 refills | Status: DC

## 2021-01-17 MED ORDER — INSULIN LISPRO (1 UNIT DIAL) 100 UNIT/ML (KWIKPEN)
5.0000 [IU] | PEN_INJECTOR | Freq: Three times a day (TID) | SUBCUTANEOUS | 11 refills | Status: DC
  Filled 2021-01-17 (×2): qty 15, 100d supply, fill #0

## 2021-01-17 MED ORDER — BLOOD GLUCOSE MONITOR KIT: PACK | 0 refills | Status: AC

## 2021-01-17 NOTE — Progress Notes (Signed)
 Patient ID: Joe Kelly, male   DOB: 1991/03/27, 30 y.o.   MRN: [id number]  Chief Complaint  Patient presents with   Blood Sugar Problem    Unmanaged diabetes    HPI Joe Kelly is a 30 y.o. male who presents for presents for an initial office visit and evaluation of new onset T2DM. He is monitoring his blood glucose twice a day. Morning blood sugar levels are >200mg /dl and post-prandial [id number]-$[id number]$$MzfnczAzqnmzIZPI_[id number]$ /dl. He was discharged home on insulin  humalog , humulin  70/30 and metformin . He is only taking the humulin  70/30 and the metformin . He denies any medication side effects. Still c/o of polyuria, polydipsia and fatigue. Denies visual changes, numbness and tingling. Reports regular BMs.    Past Medical History:  Diagnosis Date   Diabetes mellitus without complication (HCC)    Hyperlipidemia     Past Surgical History:  Procedure Laterality Date   NO PAST SURGERIES      Family History  Problem Relation Age of Onset   Diabetes Mother    Healthy Father     Social History Social History   Tobacco Use   Smoking status: Former    Current packs/day: 0.00    Types: Cigarettes    Quit date: 12/2020    Years since quitting: 3.2   Smokeless tobacco: Never  Vaping Use   Vaping status: Never Used  Substance Use Topics   Alcohol use: Not Currently    Comment: last use 12 years ago   Drug use: Not Currently    Types: Marijuana    Comment: quit 2 years ago    Allergies  Allergen Reactions   Coconut Flavoring Agent (Non-Screening)     Dizziness     Current Outpatient Medications  Medication Sig Dispense Refill   blood glucose meter kit and supplies KIT Dispense based on patient and insurance preference. Use up to four times daily as directed to monitor blood glucose 1 each 0   acetaminophen  (TYLENOL ) 325 MG tablet Take 325 mg by mouth once as needed.     albuterol  (VENTOLIN  HFA) 108 (90 Base) MCG/ACT inhaler Inhale 2 puffs into the lungs every 6 (six) hours as  needed for wheezing or shortness of breath. 8 g 0   atorvastatin  (LIPITOR) 80 MG tablet Take 1 tablet (80 mg total) by mouth daily. 30 tablet 0   HYDROcodone -acetaminophen  (NORCO/VICODIN) 5-325 MG tablet Take 1-2 tablets by mouth every 6 (six) hours as needed. Maximum 6 tablets in a 24-hour period. 10 tablet 0   Insulin  Glargine (BASAGLAR  KWIKPEN) 100 UNIT/ML Inject 14 Units into the skin at bedtime. 15 mL 1   Insulin  Pen Needle (TECHLITE PEN NEEDLES) 32G X 4 MM MISC use with basaglar  and humalog  100 each 0   Insulin  Pen Needle 32G X 4 MM MISC Use as directed with insulin  pens. 200 each 2   omega-3 acid ethyl esters (LOVAZA ) 1 g capsule Take 1 (one) capsule by mouth 2 (two) times daily. 60 capsule 0   terbinafine  (LAMISIL ) 250 MG tablet Take 1 tablet (250 mg total) by mouth daily. 90 tablet 0   Vitamin D , Ergocalciferol , (DRISDOL ) 1.25 MG (50000 UNIT) CAPS capsule Take 1 capsule (50,000 Units total) by mouth every 7 (seven) days. 12 capsule 0   No current facility-administered medications for this visit.    Review of Systems Review of Systems  Constitutional:  Positive for fatigue. Negative for fever.  HENT: Negative.    Eyes:  Negative for photophobia and visual disturbance.  Respiratory:  Negative.    Cardiovascular: Negative.   Gastrointestinal: Negative.   Endocrine: Positive for polydipsia and polyuria. Negative for polyphagia.  Genitourinary: Negative.   Musculoskeletal: Negative.   Skin: Negative.   Neurological:  Negative for dizziness, weakness, numbness and headaches.  Hematological: Negative.   Psychiatric/Behavioral:  Negative for confusion and dysphoric mood.     Blood pressure 109/69, pulse 89, temperature 98 F (36.7 C), height 5' 5 (1.651 m), weight 117 lb 6.4 oz (53.3 kg), SpO2 95%.  Physical Exam Physical Exam Vitals and nursing note reviewed.  Constitutional:      Appearance: Normal appearance.  HENT:     Head: Normocephalic and atraumatic.     Nose: Nose  normal.     Mouth/Throat:     Mouth: Mucous membranes are dry.     Pharynx: Oropharynx is clear.   Eyes:     Extraocular Movements: Extraocular movements intact.     Conjunctiva/sclera: Conjunctivae normal.     Pupils: Pupils are equal, round, and reactive to light.    Cardiovascular:     Rate and Rhythm: Normal rate and regular rhythm.     Pulses: Normal pulses.     Heart sounds: Normal heart sounds.  Pulmonary:     Effort: Pulmonary effort is normal.     Breath sounds: Normal breath sounds.  Abdominal:     General: Abdomen is flat.     Palpations: Abdomen is soft.  Genitourinary:    Comments: Deferred  Musculoskeletal:        General: Normal range of motion.     Cervical back: Normal range of motion and neck supple.   Skin:    General: Skin is warm and dry.     Capillary Refill: Capillary refill takes less than 2 seconds.   Neurological:     General: No focal deficit present.     Mental Status: He is alert and oriented to person, place, and time.    Data Reviewed CBC, CMP, MG, PHOS, HgA1C and lipid panel  Assessment and plan 1. Type 2 diabetes mellitus with hyperglycemia, with long-term current use of insulin  (HCC) (Primary) Newly diagnosed diabetic with persistent hyperglycemia. Insulin  regimen modified and patient educated at length on insulin  administration, medication adherence, lifestyle modification and sick day modalities. Will screen for comorbidities. Will f/u in 1 week until blood glucose levels are within recommended ranges. Educated on S/SX of hypo and hyperglycemia and appropriate actions.  - CBC w/Diff - Comp Met (CMET) - Lipid Profile - Urine Microalbumin w/creat. ratio - C-peptide - Vitamin D  1,25 dihydroxy - POCT Glucose (CBG)    Joe Kelly 02/27/2024, 4:09 PM

## 2021-01-17 NOTE — Patient Instructions (Signed)
Diabetes mellitus e nutrio, adultos Diabetes Mellitus and Nutrition, Adult Quando voc sofre de diabetes, ou diabetes mellitus,  muito importante ter hbitos de alimentao saudveis, uma vez que os seus nveis de acar no sangue (glicose) so significativamente afetados pelo que voc come e bebe. Comer alimentos saudveis nas quantidades corretas e aproximadamente nos mesmos horrios todos os dias pode ajudar voc a:  Controlar seu nvel de glicose sangunea.  Reduzir seu risco de doena cardaca.  Melhorar sua presso arterial.  Alcanar ou manter um peso saudvel. O que pode influenciar meu plano alimentar? Todas as pessoas com diabetes so diferentes, e cada pessoa tem diferentes necessidades em termos de um plano de refeies. Seu mdico poder recomendar que voc consulte um nutricionista para elaborar o melhor plano de refeies para voc. Seu plano de refeies poder variar dependendo de fatores como:  As calorias de que voc necessita.  Os medicamentos que toma.  Seu peso.  Seus nveis de glicose sangunea, presso arterial e colesterol.  Seu nvel de atividade.  Outros quadros clnicos, como doena cardaca ou renal. Como os carboidratos me afetam? Os carboidratos, tambm chamado de acares, afetam o nvel de [employer] do que qualquer outro tipo de [place]. A ingesto de carboidratos aumenta naturalmente a quantidade de glicose no seu sangue. A contagem de carboidratos  um mtodo para acompanhar a quantidade de carboidratos que voc ingere. A contagem de carboidratos  importante para manter sua glicose sangunea em um nvel saudvel, principalmente se voc [place] insulina ou toma certos medicamentos orais para o diabetes.  importante saber quanto de carboidrato voc pode ingerir com segurana em cada refeio. Isso varia de pessoa para pessoa. Seu nutricionista poder ajud-lo a calcular quanto de carboidrato voc deve consumir em cada refeio e em cada  lanche. Como o lcool me afeta? O lcool pode causar uma sbita reduo do nvel de glicose sangunea (hipoglicemia), especialmente se voc usar insulina ou tomar certos medicamentos orais para o diabetes. A hipoglicemia pode ser um quadro clnico potencialmente fatal. Os sintomas de hipoglicemia, como sonolncia, vertigem e confuso, so parecidos com os sintomas da ingesto abusiva de lcool.  No beba lcool se: ? Seu mdico disser para voc no beber. ? Estiver Gordy Clement, puder estar grvida ou planejar engravidar.  Se voc consome lcool: ? No beba de estmago vazio. ? Limite seu consumo a:  0-1 dose para mulheres.  0-2 doses para homens. ? Observe quanto lcool sua bebida contm. Nos EUA, uma dose equivale a uma lata de cerveja de 12 oz (355 ml), uma taa de vinho de 5 oz (148 ml) ou uma dose de bebida destilada de 1 oz (44 ml). ? Mantenha sua hidratao com gua, refrigerante diet ou ch gelado sem acar.  Tenha em mente que o refrigerante normal, suco e outras bebidas podem conter muito acar e devem ser contadas como carboidratos. Quais so as dicas para seguir esse plano? Rienzi poro nas "Informaes nutricionais" no rtulo de alimentos e bebidas embalados. A quantidade de calorias, carboidratos, gorduras e outros nutrientes listados no rtulo se baseia em uma poro padro do alimento. Muitos itens contm mais de uma poro por embalagem.  Verifique o total de gramas (g) de carboidratos contidos em uma poro. Voc pode calcular o nmero de pores de carboidratos em uma poro dividindo o total de carboidratos por 15. Por exemplo: supondo que um alimento contenha um total de 30 g de carboidratos por poro, isso seria  igual a 2 pores de carboidratos.  Verifique a quantidade em gramas (g) de gorduras saturadas e trans em uma poro. Escolha alimentos com pouca ou nenhuma quantidade desse tipo de [place].  Verifique a  quantidade de miligramas (mg) de sal (sdio) em uma poro. A maioria das pessoas deve limitar o consumo de sdio a 2.300 mg por dia.  Sempre verifique as informaes nutricionais dos alimentos rotulados como de "baixo teor de gordura" e "gordura zero". Esses alimentos podem conter elevado teor de acar de adio ou carboidratos refinados e devem ser evitados.  Converse com seu nutricionista para identificar suas metas dirias dos nutrientes listados na tabela. No mercado  Evite comprar alimentos enlatados, semiprontos ou processados. Esses alimentos tendem a conter elevado teor de gordura, sdio e acares adicionados.  Escolha itens das reas mais externas da seo de alimentos.  l que voc geralmente encontra frutas e legumes frescos, cereais a granel, carnes frescas e laticnios frescos. Na cozinha  Use mtodos de cozimento de baixo calor, como assar, em vez de mtodos de cozimento de Advertising account planner, como fritura por imerso.  Cozinhe com leos saudveis para o corao, como de [place], canola ou [place].  Evite cozinhar com manteiga, creme ou carnes com elevado teor de [place]. Planejamento das refeies  Consuma refeies e lanches regularmente, de preferncia nos mesmos horrios todos os dias. Evite ficar longos perodos sem comer.  Comer alimentos ricos em [place], como frutas e legumes frescos, feijes e gros integrais. Converse com seu nutricionista sobre quantas pores de carboidratos voc pode comer em cada refeio.  Coma de 4-6 oz (112-168 g) de protena magra por dia, como carne magra, frango, peixe, ovos ou tofu. Uma ona (oz) de protena magra  igual a: ? 1 oz (28 g) de carne, frango ou peixe. ? 1 ovo. ?  xcara (62 g) de tofu.  Coma alguns alimentos todos os dias que contenham gorduras saudveis, como abacate, nozes, sementes e peixe.   Quais alimentos devo comer? Lambert Mody Bagas. Mas. Laranjas. Pssegos. Damascos. Ameixas. Uvas. Manga. Mamo. Rom. Kiwi.  Cerejas. Legumes Alface. Espinafre. Folhas verdes, incluindo repolho, acelga, [employer]. Beterrabas. Couve-flor. Repolho. Brcolis. Cenoura. Vagem. Tomates. Pimentas. Cebolas. Pepino. Couve-de-Bruxelas. Dorma Russell integrais, como trigo integral ou pes, bolachas, cereais, tortilhas e massas integrais. Aveia sem acar. Quinoa. Arroz integral ou selvagem. Carnes e outros alimentos fontes de protena Frutos do mar. Aves sem pele. Cortes magros de aves e carne. Queijo tofu. Nozes. Sementes. Produtos lcteos Laticnios desnatados ou semidesnatados, como leite, iogurte e queijo. Os itens listados acima podem no ser [place] alimentos e bebidas que voc pode consumir. Converse com um nutricionista para obter mais informaes. Quais alimentos devo evitar? Lambert Mody Lambert Mody em calda enlatadas. Legumes Legumes enlatados. Verduras congeladas com manteiga ou molho  base de creme de leite. Anda Latina branca e produtos  base de farinha refinada, como po, massas, salgadinhos e cereais. Evite todos os alimentos processados. Carnes e outros alimentos fontes de protena Cortes de carne com [place]. Aves com pele. Carnes empanadas ou fritas em imerso. Carne processada. Evite gorduras saturadas. Produtos lcteos Iogurte integral, queijo ou leite integral. Bebidas Bebidas aucaradas, como refrigerantes ou ch gelado industrializado. Os itens listados acima podem no ser [place] alimentos e bebidas que voc deve evitar. Converse com um nutricionista para obter mais informaes. Perguntas a fazer ao mdico  Preciso me consultar com um especialista em diabetes?  Preciso me consultar com um nutricionista?  Para que nmero devo ligar se  tiver dvidas?  Quais so os melhores horrios para verificar minha glicose sangunea? Onde conseguir mais informaes:  American Diabetes Association, ADA (Associao de Diabetes Americana): diabetes.org  Academy of Nutrition and  Dietetics (Academia de Nutrio e Diettica): www.eatright.[employer] of Diabetes and Digestive and Kidney Diseases [employer] de Diabetes e Doenas Digestivas e Renais): CarFlippers.tn  Association of Diabetes Care Education Specialists, ADCES (Associao de Especialistas em Tratamento e Jessy Oto do Diabetes): www.diabeteseducator.org Resumo   importante ter hbitos de alimentao saudveis, uma vez que os seus nveis de acar no sangue (glicose) so bastante afetados pelo que voc come e bebe.  Um plano de refeies saudveis ajudar voc a controlar sua glicose sangunea e a manter um estilo de vida saudvel.  Seu mdico poder recomendar que voc consulte um nutricionista para elaborar o melhor plano de refeies para voc.  Tenha em mente que carboidratos (acares) e lcool tm efeitos imediatos sobre seus nveis de glicose sangunea.  importante contar os carboidratos e consumir lcool com cautela. Estas informaes no se destinam a substituir as recomendaes de seu mdico. No deixe de discutir quaisquer dvidas com seu mdico. Document Revised: 09/01/2019 Document Reviewed: 09/01/2019 Elsevier Patient Education  2021 Elsevier Inc. [place] para o diabetes mellitus Diabetes Mellitus Action Plan Seguir um plano de ao para o diabetes  uma maneira de controlar seus sintomas de diabetes (diabetes mellitus). O plano  codificado por cores para ajud-lo a entender quais medidas voc precisa tomar com base nos eventuais sintomas que esteja tendo.  Se voc tiver sintomas da zona vermelha, busque atendimento mdico imediatamente.  Se voc tiver sintomas da zona amarela, voc est tendo problemas.  Se voc tiver sintomas da zona verde, voc est bem. Aprender e entender sobre o diabetes pode levar tempo. Siga o plano que voc traar junto com seu mdico. Saiba qual  a faixa alvo para seu nvel de acar no sangue (glicose) e revise seu plano de  tratamento com seu mdico a cada consulta. A faixa alvo para o meu nvel de acar no sangue  __________________________ mg/dl. Zona vermelha Busque atendimento mdico imediatamente se voc apresentar qualquer [employer] seguintes sintomas:  Um resultado de teste de acar no sangue de 54 mg/dl (3 mmol/l).  Um resultado de teste de acar no sangue igual ou maior que 240 mg/dl (14,9 mmol/l) por 2 dias consecutivos.  Confuso ou dificuldade para pensar claramente.  Dificuldade para respirar.  Doena ou febre por 2 dias ou mais e que no est melhorando.  Nveis moderados ou elevados de cetonas na urina.  Sensao de cansao ou falta de energia. Se voc tiver algum sintoma da zona vermelha, no espere para ver se os sintomas desaparecem. Procure um mdico imediatamente. Ligue para o nmero de Technical brewer (911, nos EUA). No dirija por conta prpria at o hospital. Caso seu acar no sangue caia demais (hipoglicemia grave) e voc no consiga comer ou beber, voc poder precisar de glucagon. Garanta que um parente ou amigo prximo aprenda como verificar seu acar no sangue e como lhe aplicar glucagon. Voc pode precisar ser tratado em um hospital para esse quadro clnico.   Zona amarela Se voc tiver qualquer [employer] sintomas a seguir, seu diabetes no est sob controle, e voc pode precisar fazer algumas alteraes:  Um resultado de teste de acar no sangue igual ou maior que 240 mg/dl (70,2 mmol/l) por 2 dias consecutivos.  Resultado de teste de acar no sangue abaixo de 70 mg/dl (3,9 mmol/l).  Outros sintomas de hipoglicemia, como: ? Special educational needs teacher. ? Confuso ou irritabilidade. ? Sentir fome. ? Estar com batimentos cardacos acelerados. Se voc tiver algum sintoma da zona amarela:  Trate sua hipoglicemia comendo ou bebendo 15 gramas de um carboidrato de ao rpida. Siga a regra 15:15: ? Consuma 15 g de um carboidrato de ao rpida, como:  1 tubo de glicose em  gel.  4 plulas de glicose.  4 onas (120 ml) de suco de fruta.  4 onas (120 ml) de refrigerante normal (no diet). ? Verifique seu nvel de acar no sangue 15 minutos aps consumir o carboidrato. ? Caso o segundo teste de acar no sangue ainda esteja em 70 mg/dl (3,9 mmol/l) ou abaixo disso, ingira mais 15 gramas de um carboidrato. ? Se seu acar no sangue no ultrapassar 70 mg/dl (3,9 mmol/L) aps 3 tentativas, busque atendimento mdico imediatamente. ? Faa uma refeio ou um lanche at 1 hora depois de seu acar no sangue voltar ao normal.  Continue tomando seus medicamentos de rotina conforme orientado pelo seu mdico.  Verifique seu acar no sangue com maior frequncia do que o normal. ? Anote os resultados. ? Ligue para seu mdico caso tenha dificuldade para manter seu acar no sangue dentro da meta.   Zona verde Esses sinais significam que voc est indo bem e pode manter o que est fazendo atualmente para Chief Operating Officer o diabetes:  Seu acar no sangue est dentro da sua faixa alvo pessoal. Para a [employer], o nvel de acar no sangue antes de uma refeio (pr-prandial) deve ser de 80-130 mg/dl (6,3-1,4 mmol/l).  Sentir-se bem e conseguir Education officer, environmental as atividades do dia a dia. Caso esteja na zona verde, continue a controlar o diabetes de acordo com as orientaes do seu mdico. Para fazer isso:  Mantenha uma dieta saudvel.  Faa exerccios regularmente.  Monitore seu nvel de acar no sangue de acordo com as orientaes do seu mdico.  Tome seus medicamentos somente de acordo com as orientaes do seu mdico.   Onde conseguir mais informaes  American Diabetes Association, ADA (Associao de Diabetes Americana): diabetes.org  Association of Diabetes Care & Education Specialists, ADCES (Associao de Especialistas em Tratamento e Educao do Diabetes): diabeteseducator.org Resumo  Seguir um plano de ao para o diabetes  uma maneira de controlar seus  sintomas de diabetes. O plano  codificado por cores para ajud-lo a entender quais medidas voc precisa tomar com base nos eventuais sintomas que esteja tendo.  Siga o plano que voc traar junto com seu mdico. Certifique-se de saber qual sua faixa alvo pessoal de acar no sangue.  Revise seu plano de tratamento junto com seu mdico a cada consulta. Estas informaes no se destinam a substituir as recomendaes de seu mdico. No deixe de discutir quaisquer dvidas com seu mdico. Document Revised: 03/20/2020 Document Reviewed: 03/20/2020 Elsevier Patient Education  2021 [employer].

## 2021-01-18 ENCOUNTER — Other Ambulatory Visit: Payer: Self-pay | Admitting: Gerontology

## 2021-01-18 DIAGNOSIS — E781 Pure hyperglyceridemia: Secondary | ICD-10-CM

## 2021-01-18 DIAGNOSIS — E1165 Type 2 diabetes mellitus with hyperglycemia: Secondary | ICD-10-CM

## 2021-01-18 DIAGNOSIS — Z794 Long term (current) use of insulin: Secondary | ICD-10-CM

## 2021-01-18 MED ORDER — OMEGA-3-ACID ETHYL ESTERS 1 G PO CAPS
1.0000 g | ORAL_CAPSULE | Freq: Two times a day (BID) | ORAL | 0 refills | Status: DC
  Filled 2021-01-18: qty 60, 30d supply, fill #0

## 2021-01-18 MED ORDER — INSULIN LISPRO (1 UNIT DIAL) 100 UNIT/ML (KWIKPEN)
7.0000 [IU] | PEN_INJECTOR | Freq: Three times a day (TID) | SUBCUTANEOUS | 11 refills | Status: DC
  Filled 2021-01-18: qty 15, 71d supply, fill #0

## 2021-01-18 MED ORDER — ROSUVASTATIN CALCIUM 5 MG PO TABS
5.0000 mg | ORAL_TABLET | Freq: Every day | ORAL | 0 refills | Status: DC
  Filled 2021-01-18: qty 30, 30d supply, fill #0

## 2021-01-18 NOTE — Progress Notes (Signed)
Patient was contacted through Interpreter services Ms. Joe Kelly [phone] at 4;39 pm with regards to critical lab result from [facility]. His Glucose was 778 mg/dl and Triglyceride was 6,720 mg/dl. He states that his blood glucose at 6 am was 312 mg/dl , at 2 pm was 947 mg/dl and he checked it at 0:96 pm during telephone visit and it was 320 mg/dl. He states that he will start taking 10 units of Basaglar tonight, and was only taking 5 units of Lispro tid with meals. His Lispro was increased to 7 units tid, and was advised to inject 7 units during phone call. He administered 7 units and will start Basaglar at bedtime. He was also notified to pick up Lovaza and Crestor at [facility] tomorrow for his elevated Triglyceride. He was advised to continue on low carb/non concentrated sweet diet, increase water intake. He was also advised to go to the ER with elevated blood glucose greater than 500 mg/dl or if symptomatic. He verbalized understanding stating that he is feeling good and will follow up with his appointment on Tuesday.

## 2021-01-19 ENCOUNTER — Other Ambulatory Visit: Payer: Self-pay

## 2021-01-23 ENCOUNTER — Encounter: Payer: Self-pay | Admitting: Gerontology

## 2021-01-23 ENCOUNTER — Other Ambulatory Visit: Payer: Self-pay

## 2021-01-23 ENCOUNTER — Ambulatory Visit: Payer: Self-pay | Admitting: Gerontology

## 2021-01-23 VITALS — BP 105/70 | HR 95 | Temp 97.5°F | Resp 16 | Ht 61.0 in | Wt 115.1 lb

## 2021-01-23 DIAGNOSIS — Z0000 Encounter for general adult medical examination without abnormal findings: Secondary | ICD-10-CM

## 2021-01-23 DIAGNOSIS — E1169 Type 2 diabetes mellitus with other specified complication: Secondary | ICD-10-CM

## 2021-01-23 DIAGNOSIS — E1165 Type 2 diabetes mellitus with hyperglycemia: Secondary | ICD-10-CM | POA: Insufficient documentation

## 2021-01-23 DIAGNOSIS — Z794 Long term (current) use of insulin: Secondary | ICD-10-CM

## 2021-01-23 DIAGNOSIS — E871 Hypo-osmolality and hyponatremia: Secondary | ICD-10-CM

## 2021-01-23 DIAGNOSIS — E785 Hyperlipidemia, unspecified: Secondary | ICD-10-CM

## 2021-01-23 LAB — GLUCOSE, POCT (MANUAL RESULT ENTRY): POC Glucose: 224 mg/dl — AB (ref 70–99)

## 2021-01-23 MED ORDER — INSULIN LISPRO (1 UNIT DIAL) 100 UNIT/ML (KWIKPEN)
9.0000 [IU] | PEN_INJECTOR | Freq: Three times a day (TID) | SUBCUTANEOUS | 11 refills | Status: DC
Start: 2021-01-23 — End: 2021-03-11
  Filled 2021-01-23: qty 15, 56d supply, fill #0

## 2021-01-23 MED ORDER — BASAGLAR KWIKPEN 100 UNIT/ML ~~LOC~~ SOPN
13.0000 [IU] | PEN_INJECTOR | Freq: Every day | SUBCUTANEOUS | 1 refills | Status: DC
Start: 2021-01-23 — End: 2021-03-11
  Filled 2021-01-23: qty 15, 115d supply, fill #0

## 2021-01-23 NOTE — Progress Notes (Signed)
Established Patient Office Visit  Subjective:  Patient ID: Joe Kelly, male    DOB: 1991/06/15  Age: 30 y.o. MRN: [id number]  CC:  Chief Complaint  Patient presents with  . Diabetes    Blood sugar 212 fasting this morning    HPI Joe Kelly is a 30 y/o male who has history of T2DM, Hyperlipidemia, presents for follow up. His HgbA1c done on 12/05/20 was 11.7%. He states that he's compliant with his medications and continues to adhere to ADA diet. He checks his blood glucose 3-4 times daily, stating that his fasting readings ranges between 200-215 mg/dl and today's reading was 212 mg/dl. His blood glucose done during visit was 224 mg/dl. He denies hypoglycemic symptoms, endorses polyuria and polyphagia. His sodium was 129 mmol, he denies consumption of alcoholic beverages and any neurological symptoms. His total cholesterol was 422 mg/dl, Triglycerides 2,123 mg/dl, HDL 24 mg/dl, he denies right upper quadrant abdominal pain. He states that he will pick up Rosuvastatin and Lovaza from [facility]. Overall, he states that he's doing well and offers no further complaint.  Past Medical History:  Diagnosis Date  . Diabetes mellitus without complication ([place])   . Hyperlipidemia     Past Surgical History:  Procedure Laterality Date  . NO PAST SURGERIES      Family History  Problem Relation Age of Onset  . Diabetes Mother   . Healthy Father     Social History   Socioeconomic History  . Marital status: Single    Spouse name: Not on file  . Number of children: Not on file  . Years of education: Not on file  . Highest education level: Not on file  Occupational History  . Not on file  Tobacco Use  . Smoking status: Current Some Day Smoker    Types: Cigarettes  . Smokeless tobacco: Never Used  . Tobacco comment: 1 cigarette 3 times per week  Vaping Use  . Vaping Use: Never used  Substance and Sexual Activity  . Alcohol use: Not  Currently  . Drug use: Not Currently  . Sexual activity: Not on file  Other Topics Concern  . Not on file  Social History Narrative  . Not on file   Social Determinants of Health   Financial Resource Strain: Not on file  Food Insecurity: No Food Insecurity  . Worried About Charity fundraiser in the Last Year: Never true  . Ran Out of Food in the Last Year: Never true  Transportation Needs: No Transportation Needs  . Lack of Transportation (Medical): No  . Lack of Transportation (Non-Medical): No  Physical Activity: Not on file  Stress: Not on file  Social Connections: Not on file  Intimate Partner Violence: Not on file    Outpatient Medications Prior to Visit  Medication Sig Dispense Refill  . blood glucose meter kit and supplies KIT Dispense based on patient and insurance preference. Use up to four times daily as directed to monitor blood glucose 1 each 0  . acetaminophen (TYLENOL) 325 MG tablet Take 2 tablets (650 mg total) by mouth every 6 (six) hours as needed for moderate pain or headache. 60 tablet 2  . Insulin Glargine (BASAGLAR KWIKPEN) 100 UNIT/ML Inyecte 10 Unidades en la piel una vez al da. 15 mL 1  . insulin lispro (HUMALOG KWIKPEN) 100 UNIT/ML KwikPen Inject 7 Units into the skin 3 (three) times daily before meals. 15 mL 11  . omega-3 acid ethyl esters (LOVAZA)  1 g capsule Tome 1 cpsula (1 gramo en total) por va oral 2 (dos) veces al [employer]. (Patient not taking: Reported on 01/23/2021) 60 capsule 0  . rosuvastatin (CRESTOR) 5 MG tablet Take 1 tablet (5 mg total) by mouth daily. (Patient not taking: Reported on 01/23/2021) 30 tablet 0   No facility-administered medications prior to visit.    Allergies  Allergen Reactions  . Coconut Flavor     Dizziness     ROS Review of Systems  Constitutional: Negative.   Eyes: Negative.   Respiratory: Negative.   Cardiovascular: Negative.   Endocrine: Positive for polydipsia and polyuria.  Skin: Negative.   Neurological:  Negative.   Psychiatric/Behavioral: Negative.       Objective:    Physical Exam HENT:     Head: Normocephalic and atraumatic.  Eyes:     Extraocular Movements: Extraocular movements intact.     Conjunctiva/sclera: Conjunctivae normal.     Pupils: Pupils are equal, round, and reactive to light.  Cardiovascular:     Rate and Rhythm: Normal rate and regular rhythm.     Pulses: Normal pulses.     Heart sounds: Normal heart sounds.  Pulmonary:     Effort: Pulmonary effort is normal.     Breath sounds: Normal breath sounds.  Abdominal:     General: Abdomen is flat. Bowel sounds are normal. There is no distension.     Palpations: Abdomen is soft.     Tenderness: There is no abdominal tenderness.  Skin:    General: Skin is warm.  Neurological:     General: No focal deficit present.     Mental Status: He is alert and oriented to person, place, and time. Mental status is at baseline.  Psychiatric:        Mood and Affect: Mood normal.        Behavior: Behavior normal.        Thought Content: Thought content normal.        Judgment: Judgment normal.     BP 105/70 (BP Location: Right Arm, Patient Position: Sitting, Cuff Size: Normal)   Pulse 95   Temp (!) 97.5 F (36.4 C)   Resp 16   Ht _0  (1.549 m)   Wt 115 lb 1.6 oz (52.2 kg)   SpO2 97%   BMI 21.75 kg/m  Wt Readings from Last 3 Encounters:  01/23/21 115 lb 1.6 oz (52.2 kg)  01/17/21 117 lb 6.4 oz (53.3 kg)  12/05/20 114 lb (51.7 kg)     Health Maintenance Due  Topic Date Due  . PNEUMOCOCCAL POLYSACCHARIDE VACCINE AGE 54-64 HIGH RISK  Never done  . COVID-19 Vaccine (1) Never done  . FOOT EXAM  Never done  . OPHTHALMOLOGY EXAM  Never done  . Hepatitis C Screening  Never done  . TETANUS/TDAP  Never done    There are no preventive care reminders to display for this patient.  Lab Results  Component Value Date   TSH 1.934 12/05/2020   Lab Results  Component Value Date   WBC 5.7 01/17/2021   HGB 13.4  01/17/2021   HCT 40.6 01/17/2021   MCV 94 01/17/2021   PLT 277 01/17/2021   Lab Results  Component Value Date   NA 129 (L) 01/17/2021   K 4.1 01/17/2021   CO2 20 01/17/2021   GLUCOSE 778 (HH) 01/17/2021   BUN 11 01/17/2021   CREATININE 0.70 (L) 01/17/2021   BILITOT 0.3 01/17/2021   ALKPHOS 185 (H) 01/17/2021  AST 13 01/17/2021   ALT 17 01/17/2021   PROT 6.6 01/17/2021   ALBUMIN 4.2 01/17/2021   CALCIUM 9.0 01/17/2021   ANIONGAP 12 12/07/2020   EGFR 126 01/17/2021   Lab Results  Component Value Date   CHOL 422 (H) 01/17/2021   Lab Results  Component Value Date   HDL 24 (L) 01/17/2021   Lab Results  Component Value Date   LDLCALC Comment (A) 01/17/2021   Lab Results  Component Value Date   TRIG 2,123 ([place]) 01/17/2021   Lab Results  Component Value Date   CHOLHDL 17.6 (H) 01/17/2021   Lab Results  Component Value Date   HGBA1C 11.7 (H) 12/05/2020      Assessment & Plan:     1. Type 2 diabetes mellitus with hyperglycemia, with long-term current use of insulin (HCC) - His blood glucose reading has decreased from the 500's to 200's. He was advised that his goal fasting glucose reading should be between 80-130 mg/dl. He was encouraged to check his blood glucose 3-4 times daily, record and bring log to follow up appointment, continue on low carb/non concentrated sweet diet.Marland Kitchen His Basaglar was increased to 13 units at bedtime and meal time insulin to 9 units tid. - POCT Glucose (CBG) - Insulin Glargine (BASAGLAR KWIKPEN) 100 UNIT/ML; Inject 13 Units into the skin at bedtime.  Dispense: 15 mL; Refill: 1 - insulin lispro (HUMALOG KWIKPEN) 100 UNIT/ML KwikPen; Inject 9 Units into the skin 3 (three) times daily before meals.  Dispense: 15 mL; Refill: 11 - Ambulatory referral to Ophthalmology  2. Hyponatremia - Will recheck sodium, advised on tighter glycemic control, to reduce hyperglycemic side effects. - Basic Metabolic Panel (BMET); Future  3. Hyperlipidemia  associated with type 2 diabetes mellitus ([place]) - He was encouraged to pick up Rosuvastatin and Lovaza from the pharmacy, continue pn low fat/colesterol diet.  4. Health care maintenance - Will check GAD to rule in Type 1 DM since his C- peptide was low. - Glutamic acid decarboxylase auto abs; Future - Gamma GT; Future - Vitamin D (25 hydroxy); Future   Follow-up: Return in about 16 days (around 02/08/2021), or if symptoms worsen or fail to improve.    Joe Elmquist Jerold Coombe, NP

## 2021-01-23 NOTE — Patient Instructions (Signed)
Plan de alimentacin cardiosaludable Heart-Healthy Eating Plan La planificacin de las comidas cardiosaludables incluye lo siguiente:  Consumir menos grasas no saludables.  Consumir ms grasas saludables.  Realizar otros cambios en su dieta. Hable con el mdico o con un especialista en alimentacin (nutricionista) para crear un plan de alimentacin que sea adecuado para usted. En qu consiste el plan? El mdico puede recomendarle un plan de alimentacin que incluya lo siguiente:  Grasas totales: ______% o menos del total de caloras por da.  Grasas saturadas: ______% o menos del total de caloras por da.  Colesterol: menos de _________mg por da. Cules son algunos consejos para seguir este plan? Al cocinar Evite frer los alimentos. En cambio, trate de cocinarlos en el horno, en la plancha o en la parrilla, o hervirlos. Tambin puede reducir las grasas de la siguiente forma:  Quite la piel de las aves.  Quite todas las grasas visibles de las carnes.  Cocine al vapor las verduras en agua o caldo. Planificacin de las comidas  En las comidas, divida su plato en cuatro partes iguales: ? Llene la mitad del plato con verduras y ensaladas de hojas verdes. ? Llene un cuarto del plato con cereales integrales. ? Llene un cuarto del plato con alimentos con protenas magras.  Coma 4 o 5porciones de verduras por da. Una porcin de verduras equivale a lo siguiente: ? 1taza de verduras crudas o cocidas. ? 2 tazas de verduras de hojas verdes crudas.  Coma 4 o 5porciones de frutas por da. Una porcin de fruta equivale a lo siguiente: ? 1 fruta mediana entera. ? taza de fruta desecada. ?  taza de fruta fresca, congelada o enlatada. ? taza de jugo 100% de fruta.  Consuma ms alimentos que tengan fibra soluble. Ellos son las manzanas, el brcoli, las zanahorias, los frijoles, los guisantes y la cebada. Trate de consumir de 20a 30g de fibra por da.  Coma 4 o 5porciones  de frutos secos, legumbres y semillas por semana: ? 1 porcin de frijoles o legumbres secos equivale a taza despus de su coccin. ? 1 porcin de frutos secos equivale a  de taza. ? 1 porcin de semillas equivale a una cucharada.   Informacin general  Coma ms comidas caseras. Coma menos comida de restaurantes, bufs y comida rpida.  Limite o evite el alcohol.  Limite los alimentos con alto contenido de almidn y azcar.  Evite las comidas fritas.  Baje de peso si es necesario.  Intente llevar un registro de la cantidad de sal (sodio) que ingiere. Esto es importante si tiene presin arterial alta. Pdale a su mdico que le d ms informacin al respecto.  Trate de incorporar comidas vegetarianas cada semana. Grasas  Elija las grasas saludables. Estas incluyen aceite de oliva y de canola, semillas de lino, nueces, almendras y semillas.  Consuma ms grasas omega-3. Estas incluyen salmn, caballa, sardinas, atn, aceite de lino y semillas de lino molidas. Intente comer pescado al menos dos veces por semana.  Lea las etiquetas de los alimentos. Evite los alimentos que contienen grasas trans o altas cantidades de grasas saturadas.  Limite el consumo de grasas saturadas. ? Estas se encuentran frecuentemente en productos de origen animal, como carnes, mantequilla y crema. ? Tambin se encuentran en alimentos de origen vegetal, como el aceite de palma, el aceite de palmiste y el aceite de coco.  Evite los alimentos con aceites parcialmente hidrogenados. Estos tienen grasas trans. Entre los ejemplos, se incluyen margarinas en barra, algunas margarinas untables,   galletas dulces y saladas y otros productos horneados. Qu alimentos puedo comer? Lambert Mody Lambert Mody frescas, en conserva (en su jugo natural) o frutas congeladas. Verduras Verduras frescas o congeladas (crudas, al vapor, asadas o grilladas). Ensaladas de hojas verdes. Cereales La mayora de los cereales. Elija casi siempre trigo  integral y Psychologist, prison and probation services. Arroz y pastas, incluido el arroz integral y las pastas elaboradas con trigo integral. Estate agent y otras protenas Carnes magras de res, ternera, cerdo y cordero a las que se les haya quitado la grasa visible. Pollo y pavo sin piel. Todos los pescados y [employer]. Pato salvaje, conejo, faisn y venado. Claras de huevo o sustitutos del huevo bajos en colesterol. Porotos, guisantes y lentejas secos y tofu. Semillas y la mayora de los frutos secos. Lcteos Quesos descremados y semidescremados, entre ellos, ricota y Artist. Leche descremada o al 1% que sea lquida, en polvo o evaporada. [place]. Yogur descremado o semidescremado. Grasas y Naval architect no hidrogenadas (sin grasas trans). Aceites vegetales, incluido el de soja, ssamo, girasol, [place], man, crtamo, maz, canola y semillas de algodn. Alios para ensalada o mayonesa elaborados con aceite vegetal. Bebidas Agua mineral. T y caf. Gaseosas dietticas. Dulces y [employer], gelatina y helado de frutas. Pequeas cantidades de chocolate amargo. Limite todos los dulces y postres. Alios y [employer] y condimentos. Es posible que los productos que se enumeran ms [place] no constituyan una lista completa de los alimentos y las bebidas que puede tomar. Consulte a un nutricionista para conocer ms opciones. Qu alimentos debo evitar? Lambert Mody Fruta enlatada en almbar espeso. Frutas con salsa de crema o mantequilla. Frutas cocidas en aceite. Limite el consumo de coco. Verduras Verduras cocinadas con salsas de queso, crema o mantequilla. Verduras fritas. Cereales Panes elaborados con grasas saturadas o trans, aceites o [employer]el. Croissants. Panecillos dulces. Ros[place]illas. Galletas con alto contenido de grasas, como las que contienen [place]. Carnes y otras protenas Carnes grasas, como perros calientes, [place] de res, salchichas, tocino,  asado de Engineer, structural o [place]. Fiambres con alto contenido de [place], como salame y Archivist. Caviar. Pato y ganso domsticos. Vsceras, como hgado. Lcteos Crema, crema agria, queso crema y [place] cottage con crema. Quesos enteros. Leche entera o al 2% que sea [place], evaporada o condensada. Suero de [employer]. Salsa de crema o queso con alto contenido de [place]. Yogur elaborado con Mattel. Grasas y [place] de carne o materia grasa. Manteca de cacao, aceites hidrogenados, aceite de palma, aceite de coco, aceite de palmiste. Grasas y materias grasas slidas, incluida la grasa del tocino, el cerdo [place], la [place] de cerdo y Community education officer. Sustitutos no lcteos de la crema. Aderezos para ensalada con queso o crema agria. Bebidas Refrescos regulares y jugos con agregado de Location manager. Dulces y [employer]. Pudin. Galletas dulces. Bizcochuelos. Pasteles. Chocolate con leche o chocolate blanco. Almbares con mantequilla. Helados o bebidas elaboradas con helado con alto contenido de grasas. Esta no es [employer] de los alimentos y las bebidas que se Higher education careers adviser. Consulte a un nutricionista para obtener ms informacin. Resumen  La planificacin de las comidas cardiosaludables incluye comer menos grasas poco saludables, comer ms grasas saludables y hacer otros cambios en su dieta.  Siga una dieta equilibrada. Esta incluye frutas y verduras, productos lcteos descremados o semidescremados, protenas magras, frutos secos y legumbres, cereales integrales y aceites y grasas cardiosaludables. Esta informacin no tiene Marine scientist el consejo del mdico. Asegrese de hacerle al  mdico cualquier pregunta que tenga. Document Revised: 11/29/2017 Document Reviewed: 11/29/2017 Elsevier Patient Education  2021 Elsevier Inc. https://www.diabeteseducator.org/docs/default-source/living-with-diabetes/conquering-the-grocery-store-v1.pdf?sfvrsn=4">  Recuento de carbohidratos para  la diabetes mellitus en los adultos Carbohydrate Counting for Diabetes Mellitus, Adult El recuento de carbohidratos es un mtodo para llevar un registro de la cantidad de carbohidratos que se ingieren. La ingesta natural de carbohidratos aumenta la cantidad de azcar (glucosa) en la sangre. El recuento de la cantidad de carbohidratos que se ingieren mejora el control del nivel de [place], lo que ayuda a [name] la diabetes. Es importante saber la cantidad de carbohidratos que se pueden ingerir en cada comida sin correr [name], minerals. Esto es [name]. Un nutricionista puede ayudarlo a crear un plan de alimentacin y a calcular la cantidad de carbohidratos que debe ingerir en cada comida y colacin. Qu alimentos contienen carbohidratos? Los siguientes alimentos incluyen carbohidratos:  Granos, como panes y cereales.  Frijoles secos y productos con soja.  Verduras con almidn, como papas, guisantes y maz.  Nils Pyle y jugos de frutas.  Leche y Dentist.  Dulces y colaciones, como pasteles, galletas, caramelos, papas fritas de bolsa y refrescos.   Cmo se calculan los carbohidratos de los alimentos? Hay dos maneras de calcular los carbohidratos de los alimentos. Puede leer las etiquetas de los alimentos o aprender cules son los tamaos de las porciones estndar de los alimentos. Puede usar cualquiera de [address] o [place] combinacin de [place]. Usar la [name] La lista de informacin nutricional est incluida en las etiquetas de casi todas las bebidas y los alimentos envasados de los [place]. Incluye lo siguiente:  El tamao de la porcin.  Informacin sobre los nutrientes de cada porcin, incluidos los gramos (g) de carbohidratos por porcin. Para usar la informacin nutricional:  Decida cuntas porciones va a comer.  Multiplique la cantidad de porciones por el nmero de carbohidratos por porcin.  El resultado es la cantidad total  de carbohidratos que comer. Conocer los tamaos de las porciones estndar de los alimentos Cuando coma alimentos que contengan carbohidratos y que no estn envasados o no incluyan la informacin nutricional en la etiqueta, debe medir las porciones para poder calcular la cantidad de carbohidratos.  Mida los alimentos que comer con una balanza de alimentos o una taza medidora, si es necesario.  Decida cuntas porciones de [name], systems.  Multiplique el nmero de porciones por15. En los alimentos que contienen carbohidratos, una porcin [place] a 15 g de carbohidratos. ? Por ejemplo, si come 2 tazas o 10onzas (300g) de fresas, habr comido 2porciones y 30g de carbohidratos (2porciones x 15g=30g).  En el caso de las comidas que contienen mezclas de ms de un alimento, como las sopas y los guisos, debe calcular los carbohidratos de cada alimento que se incluye. La siguiente lista contiene los tamaos de porciones estndar de los alimentos ricos en carbohidratos ms comunes. Cada una de estas porciones tiene aproximadamente 15g de carbohidratos:  1rebanada de pan.  1tortilla de seis pulgadas (15cm).  ? de taza o 2onzas (53g) de arroz o pasta cocidos.   taza o 3 onzas (85 g) de lentejas o frijoles cocidos o enlatados, escurridos y enjuagados.   taza o 3onzas (85g) de verduras con almidn, como guisantes, maz o zapallo.   taza o 4 onzas (120 g) de cereal caliente.   taza o 3 onzas (85 g) de papas hervidas o en pur, o  o 3 onzas (85 g) de una papa grande  al horno.   taza o 4 onzas fluidas ( ) de jugo de frutas.  1 taza u 8 onzas fluidas (237 ml) de leche.  1 unidad pequea o 4 onzas (106 g) de manzana.   unidad o 2 onzas (63 g) de una banana mediana.  1 taza o 5 oz (150 g) de fresas.  3 tazas o 1 oz (24g) de palomitas de maz. Cul sera un ejemplo de recuento de carbohidratos? Para calcular el nmero de carbohidratos de este ejemplo de  comida, siga los pasos que se describen a continuacin. Ejemplo de comida  3 onzas (85g) de pechugas de pollo.  ? de taza o 4 onzas (106 g) de arroz integral.   taza o 3 onzas (85 g) de maz.  1 taza u 8 onzas fluidas (237 ml) de leche.  1 taza o 5onzas (150g) de fresas con crema batida sin azcar. Clculo de carbohidratos 1. Identifique los alimentos que contienen carbohidratos: ? Arroz. ? Maz. ? Leche. ? Jinny Sanders. 2. Calcule cuntas porciones come de cada alimento: ? 2 porciones de arroz. ? 1 porcin de maz. ? 1 porcin de leche. ? 1 porcin de fresas. 3. Multiplique cada nmero de porciones por 15g: ? 2 porciones de arroz x 15 g = 30 g. ? 1 porcin de maz x 15 g = 15 g. ? 1 porcin de leche x 15 g = 15 g. ? 1 porcin de fresas x 15 g = 15 g. 4. Sume todas las cantidades para conocer el total de gramos de carbohidratos consumidos: ? 30g + 15g + 15g + 15g = 75g de carbohidratos en total. Consejos para seguir este plan Al ir de compras  [place]ore un plan de comidas y luego haga una lista de compras.  Compre verduras frescas y congeladas, frutas frescas y congeladas, productos lcteos, huevos, frijoles, lentejas y cereales integrales.  Fjese en las etiquetas de los alimentos. Elija los alimentos que tengan ms fibra y Neurosurgeon.  Evite los alimentos procesados y los alimentos con [name], clinical. Planificacin de las comidas  Trate de consumir la misma cantidad de carbohidratos en cada comida y en cada colacin.  Planifique tomar comidas y colaciones regulares y equilibradas. Dnde buscar ms informacin  [name]: www.diabetes.org  [name]: [name] Resumen  El recuento de carbohidratos es un mtodo para llevar un registro de la cantidad de carbohidratos que se ingieren.  La ingesta natural  de carbohidratos aumenta la cantidad de azcar (glucosa) en la sangre.  El recuento de la cantidad de carbohidratos que se ingieren mejora el control del nivel de [place], lo que ayuda a [name] la diabetes.  Un nutricionista puede ayudarlo a crear un plan de alimentacin y a calcular la cantidad de carbohidratos que debe ingerir en cada comida y colacin. Esta informacin no tiene Theme park manager el consejo del mdico. Asegrese de hacerle al mdico cualquier pregunta que tenga. Document Revised: 09/27/2019 Document Reviewed: 09/27/2019 Elsevier Patient Education  2021 [employer].

## 2021-01-24 ENCOUNTER — Ambulatory Visit: Payer: Self-pay | Admitting: Adult Health

## 2021-01-24 LAB — CBC WITH DIFFERENTIAL/PLATELET
Basophils Absolute: 0 10*3/uL (ref 0.0–0.2)
Basos: 0 %
EOS (ABSOLUTE): 0 10*3/uL (ref 0.0–0.4)
Eos: 1 %
Hematocrit: 40.6 % (ref 37.5–51.0)
Hemoglobin: 13.4 g/dL (ref 13.0–17.7)
Immature Grans (Abs): 0.1 10*3/uL (ref 0.0–0.1)
Immature Granulocytes: 1 %
Lymphocytes Absolute: 1.1 10*3/uL (ref 0.7–3.1)
Lymphs: 19 %
MCH: 31 pg (ref 26.6–33.0)
MCHC: 33 g/dL (ref 31.5–35.7)
MCV: 94 fL (ref 79–97)
Monocytes Absolute: 0.4 10*3/uL (ref 0.1–0.9)
Monocytes: 7 %
Neutrophils Absolute: 4.1 10*3/uL (ref 1.4–7.0)
Neutrophils: 72 %
Platelets: 277 10*3/uL (ref 150–450)
RBC: 4.32 x10E6/uL (ref 4.14–5.80)
RDW: 13.1 % (ref 11.6–15.4)
WBC: 5.7 10*3/uL (ref 3.4–10.8)

## 2021-01-24 LAB — COMPREHENSIVE METABOLIC PANEL
ALT: 17 IU/L (ref 0–44)
AST: 13 IU/L (ref 0–40)
Albumin/Globulin Ratio: 1.8 (ref 1.2–2.2)
Albumin: 4.2 g/dL (ref 4.0–5.0)
Alkaline Phosphatase: 185 IU/L — ABNORMAL HIGH (ref 44–121)
BUN/Creatinine Ratio: 16 (ref 9–20)
BUN: 11 mg/dL (ref 6–20)
Bilirubin Total: 0.3 mg/dL (ref 0.0–1.2)
CO2: 20 mmol/L (ref 20–29)
Calcium: 9 mg/dL (ref 8.7–10.2)
Chloride: 88 mmol/L — ABNORMAL LOW (ref 96–106)
Creatinine, Ser: 0.7 mg/dL — ABNORMAL LOW (ref 0.76–1.27)
Globulin, Total: 2.4 g/dL (ref 1.5–4.5)
Glucose: 778 mg/dL (ref 65–99)
Potassium: 4.1 mmol/L (ref 3.5–5.2)
Sodium: 129 mmol/L — ABNORMAL LOW (ref 134–144)
Total Protein: 6.6 g/dL (ref 6.0–8.5)
eGFR: 126 mL/min/{1.73_m2} (ref >59)

## 2021-01-24 LAB — MICROALBUMIN / CREATININE URINE RATIO
Creatinine, Urine: 11.5 mg/dL
Microalb/Creat Ratio: <26 mg/g creat (ref 0–29)
Microalbumin, Urine: <3 ug/mL

## 2021-01-24 LAB — LIPID PANEL
Chol/HDL Ratio: 17.6 ratio — ABNORMAL HIGH (ref 0.0–5.0)
Cholesterol, Total: 422 mg/dL — ABNORMAL HIGH (ref 100–199)
HDL: 24 mg/dL — ABNORMAL LOW (ref >39)
Triglycerides: 2123 mg/dL (ref 0–149)

## 2021-01-24 LAB — C-PEPTIDE: C-Peptide: 0.3 ng/mL — ABNORMAL LOW (ref 1.1–4.4)

## 2021-01-24 LAB — VITAMIN D 1,25 DIHYDROXY
Vitamin D 1, 25 (OH)2 Total: 44 pg/mL
Vitamin D2 1, 25 (OH)2: <10 pg/mL
Vitamin D3 1, 25 (OH)2: 44 pg/mL

## 2021-01-31 ENCOUNTER — Other Ambulatory Visit: Payer: Self-pay

## 2021-02-01 ENCOUNTER — Other Ambulatory Visit: Payer: Self-pay

## 2021-02-07 ENCOUNTER — Other Ambulatory Visit: Payer: Self-pay

## 2021-02-07 DIAGNOSIS — E871 Hypo-osmolality and hyponatremia: Secondary | ICD-10-CM

## 2021-02-07 DIAGNOSIS — Z0000 Encounter for general adult medical examination without abnormal findings: Secondary | ICD-10-CM

## 2021-02-08 ENCOUNTER — Ambulatory Visit: Payer: Self-pay

## 2021-02-09 LAB — BASIC METABOLIC PANEL
BUN/Creatinine Ratio: 27 — ABNORMAL HIGH (ref 9–20)
BUN: 12 mg/dL (ref 6–20)
CO2: 20 mmol/L (ref 20–29)
Calcium: 9.2 mg/dL (ref 8.7–10.2)
Chloride: 94 mmol/L — ABNORMAL LOW (ref 96–106)
Creatinine, Ser: 0.45 mg/dL — ABNORMAL LOW (ref 0.76–1.27)
Glucose: 380 mg/dL — ABNORMAL HIGH (ref 65–99)
Potassium: 4.4 mmol/L (ref 3.5–5.2)
Sodium: 132 mmol/L — ABNORMAL LOW (ref 134–144)
eGFR: 144 mL/min/{1.73_m2} (ref >59)

## 2021-02-09 LAB — VITAMIN D 25 HYDROXY (VIT D DEFICIENCY, FRACTURES)

## 2021-02-09 LAB — GLUTAMIC ACID DECARBOXYLASE AUTO ABS: Glutamic Acid Decarb Ab: <5 U/mL (ref 0.0–5.0)

## 2021-02-09 LAB — GAMMA GT: GGT: 14 IU/L (ref 0–65)

## 2021-02-26 ENCOUNTER — Other Ambulatory Visit: Payer: Self-pay

## 2021-02-28 ENCOUNTER — Other Ambulatory Visit: Payer: Self-pay

## 2021-03-01 ENCOUNTER — Other Ambulatory Visit: Payer: Self-pay

## 2021-03-06 ENCOUNTER — Ambulatory Visit: Payer: Self-pay | Admitting: Pharmacy Technician

## 2021-03-06 ENCOUNTER — Other Ambulatory Visit: Payer: Self-pay

## 2021-03-06 DIAGNOSIS — Z79899 Other long term (current) drug therapy: Secondary | ICD-10-CM

## 2021-03-06 NOTE — Progress Notes (Signed)
Spanish interpretation provided by [employer] [phone], [employer].  Patient failed to provide 2022 proof of income.  No additional medication assistance will be provided by [facility] without the required proof of income documentation.  Patient still needs to provide last 30 days of paystubs and 2021 Federal Tax Return with all attached schedules.  In addition, patient needs to complete [facility]'s contract.  Patient notified that medication assistance will resume once all requested financial documentation has been provided.  Sherilyn Dacosta Care Manager [facility]

## 2021-03-08 ENCOUNTER — Ambulatory Visit: Payer: Self-pay | Admitting: Physician Assistant

## 2021-03-08 ENCOUNTER — Other Ambulatory Visit: Payer: Self-pay

## 2021-03-08 VITALS — BP 126/84 | HR 73 | Ht 62.0 in | Wt 104.0 lb

## 2021-03-08 DIAGNOSIS — Z794 Long term (current) use of insulin: Secondary | ICD-10-CM

## 2021-03-08 DIAGNOSIS — E1165 Type 2 diabetes mellitus with hyperglycemia: Secondary | ICD-10-CM

## 2021-03-08 DIAGNOSIS — E781 Pure hyperglyceridemia: Secondary | ICD-10-CM

## 2021-03-08 LAB — GLUCOSE, POCT (MANUAL RESULT ENTRY): POC Glucose: 255 mg/dl — AB (ref 70–99)

## 2021-03-08 LAB — POCT GLYCOSYLATED HEMOGLOBIN (HGB A1C): Hemoglobin A1C: 13.7 % — AB (ref 4.0–5.6)

## 2021-03-08 MED ORDER — ATORVASTATIN CALCIUM 80 MG PO TABS
80.0000 mg | ORAL_TABLET | Freq: Every day | ORAL | 0 refills | Status: DC
  Filled 2021-03-08: qty 30, 30d supply, fill #0
  Filled 2021-03-13: qty 14, 14d supply, fill #0

## 2021-03-08 MED ORDER — OMEGA-3-ACID ETHYL ESTERS 1 G PO CAPS
2.0000 g | ORAL_CAPSULE | Freq: Two times a day (BID) | ORAL | 0 refills | Status: DC
  Filled 2021-03-08: qty 120, 30d supply, fill #0
  Filled 2021-03-13: qty 56, 14d supply, fill #0

## 2021-03-08 NOTE — Progress Notes (Signed)
Patient: Joe Kelly Male    DOB: 10/12/1990   30 y.o.   MRN: [id number] Visit Date: 03/08/2021  Today's Provider: Elwyn Reach, PA-C   Chief Complaint  Patient presents with   Diabetes    BG 255   Needs Humulin refill    Subjective:    All subjective components and conversation facilitated via translation services.  Pt returns for f/u of DM. A1c went up since initiation of insulin regimen at last visit (glargine 13 units daily at bedtime, lispro 9 units 3x/day), from 11.7 to 13.7. Pt states he is taking insulin as prescribed, but this seems unlikely given the above and more concretely, that he did not complete paperwork necessary for med mgmt to fill his Rxs (see 03/06/2021 note from Alm [facility]ard); also consistent with this is med mgmt showing the insulin has never been dispensed on review of medication history in his chart. He also reports checking his blood sugar 3x/day, with his blood sugar ranging in the 230s to 250s. This is also inconsistent with his A1c measurement of 13.7.   I emphasized with him the importance of taking his insulin as prescribed and keeping a consistent log of his blood sugars.  He reports he is taking the Lovaza and Crestor, but he reported he was not taking them to the person who completed his intake and rooming. Triglycerides from May of this year were >2,000, and Tchol was >400 (LDL not estimable). He is below the age cutoffs for MESA or PCEs to estimate 10-year CV risk.  He reports feeling fine and has no symptoms other than very infrequent, nonconcerning HAs that always resolve with Tylenol. On more direct questioning, however, he confirms having occasional nausea and daily polyuria. I noted both these symptoms are likely tied to his hyperglycemia, particularly the polyuria. He has no red flag symptoms, however.  He would like to get a B-vitamin injection. He has the B-vitamin injections. Chart review shows no clear indication for these, but he  states that he wants them because in his country they are considered valuable and desired.     Allergies  Allergen Reactions   Coconut Flavor     Dizziness    Previous Medications   BLOOD GLUCOSE METER KIT AND SUPPLIES KIT    Dispense based on patient and insurance preference. Use up to four times daily as directed to monitor blood glucose   INSULIN GLARGINE (BASAGLAR KWIKPEN) 100 UNIT/ML    Inject 13 Units into the skin at bedtime.   INSULIN LISPRO (HUMALOG KWIKPEN) 100 UNIT/ML KWIKPEN    Inject 9 Units into the skin 3 (three) times daily before meals.   Review of systems (may be based on changes from baseline unless otherwise stated). See HPI as well, with ROS building on that which is already noted in HPI (not repeated here). General: No anorexia, malaise, fatigue, or weight change Eyes: No visual blurring, double vision, or other ocular symptoms (latter asked generically) Respiratory: No SOB, DOE, cough, wheezing, orthopnea, or PND Cardiovascular: No chest pain; chest pressure; pain or pressure in neck, arm, jaw, or epigastric area; palpitations; or edema Gastrointestinal: No vomiting, abdominal pain, diarrhea, indigestion, or change in bowel habits  Genitourinary: No dysuria or other GU symptoms (latter asked generically) Musculoskeletal: No spasms, chronic pain, or other musculoskeletal symptoms (latter asked generically) Skin: No rash, itching, or new or concerning skin changes Neurologic: No  syncope, numbness, weakness, "dizziness or lightheadedness"  Social History   Tobacco Use  Smoking status: Former    Pack years: 0.00    Types: Cigarettes   Smokeless tobacco: Never   Tobacco comments:    1 cigarette 3 times per week  Substance Use Topics   Alcohol use: Not Currently   Objective:   BP 126/84   Pulse 73   Ht 5' 2"  (1.575 m)   Wt 104 lb (47.2 kg)   BMI 19.02 kg/m   Physical exam Constitutional: A&Ox3, NAD, pleasant, cooperative HEENT: [place]/AT, EOM grossly  intact Respiratory: Breathing unlabored, good and symmetric chest expansion and air entry, lungs clear to auscultation bilaterally Cardiovascular: RRR, S1, S2; no S3 or S4 appreciated; no murmurs, clicks, gallops, rubs GI: NABS in all 4 quadrants; abdomen soft, non-tender, and non-distended; no masses, no organomegaly     Assessment & Plan:   Type II DM -His diabetes is extremely poorly controlled at this time, in large part due to difficulty getting him on insulin and apparent barriers to effective, transparent communication about his diabetes and medications -See HPI for add'l. details -He needs to complete paperwork to qualify for med mgmt, but they've not dispensed any of his medication yet despite his report that he'd been taking it -Emphasized importance of his involvement in his diabetes care, including transparent, reliable conversations, taking medications as prescribed, and keeping record of blood sugars -Given med mgmt has not dispensed his medication and his A1c is persistently elevated but w/out concern for acute hyperglycemic crises, I did not change his insulin regimen today; although his A1c suggests considerable room for uptitration of insulin, I would like to know how he responds to the total daily dose from the original script before increasing further, and w/ the intense, close f/u intended until we get his blood sugar better controlled coupled w/ his long-standing hyperglycemia, deferring increase today given the particulars will not make any meaningful difference in overall outcomes. -I described that he will need to have close f/u w/ [place] until we get his glucose under better control; next f/u will be in 2 weeks -He should see endocrinology here as well, and could stagger 2-week appointments b/w endo and main clinic to get glucose under control reasonably rapidly w/out causing him untoward events  Mixed hyperlipidemia -Would start with Lovaza at 2 g BID (orig sig was 1 g BID);  sent to med mgmt to replace orig Rx -Would start w/ atorva 80 mg (orig sig was Crestor 5 mg); sent to med mgmt to replace orig Rx  Other -Although no clear medical indication for his B-vitamin injection, he can continue this for now given its cultural significance to him as we work to establish rapport w/ him -Advised he can bring to his next visit and we can administer it then -In the long run, I would like to revisit this w/ him (when we have more of a relationship established w/ him) to have a more complete discussion of R/B/A and discuss discontinuation  -F/u in 2 wks, sooner if needed; call or seek care if ?s, concerns, or new or worsening signs, symptoms, or conditions  Elwyn Reach, PA-C   [facility]

## 2021-03-09 ENCOUNTER — Other Ambulatory Visit: Payer: Self-pay

## 2021-03-09 ENCOUNTER — Emergency Department: Payer: Self-pay

## 2021-03-09 ENCOUNTER — Inpatient Hospital Stay
Admission: EM | Admit: 2021-03-09 | Discharge: 2021-03-11 | DRG: 638 | Disposition: A | Discharge status: Home / Self Care | Payer: Self-pay | Attending: Internal Medicine | Admitting: Internal Medicine

## 2021-03-09 DIAGNOSIS — R12 Heartburn: Secondary | ICD-10-CM | POA: Diagnosis present

## 2021-03-09 DIAGNOSIS — E1165 Type 2 diabetes mellitus with hyperglycemia: Secondary | ICD-10-CM

## 2021-03-09 DIAGNOSIS — E1110 Type 2 diabetes mellitus with ketoacidosis without coma: Principal | ICD-10-CM | POA: Diagnosis present

## 2021-03-09 DIAGNOSIS — Z79899 Other long term (current) drug therapy: Secondary | ICD-10-CM

## 2021-03-09 DIAGNOSIS — E876 Hypokalemia: Secondary | ICD-10-CM | POA: Diagnosis present

## 2021-03-09 DIAGNOSIS — D72829 Elevated white blood cell count, unspecified: Secondary | ICD-10-CM

## 2021-03-09 DIAGNOSIS — E785 Hyperlipidemia, unspecified: Secondary | ICD-10-CM | POA: Diagnosis present

## 2021-03-09 DIAGNOSIS — Z794 Long term (current) use of insulin: Secondary | ICD-10-CM

## 2021-03-09 DIAGNOSIS — Z833 Family history of diabetes mellitus: Secondary | ICD-10-CM

## 2021-03-09 DIAGNOSIS — E871 Hypo-osmolality and hyponatremia: Secondary | ICD-10-CM | POA: Diagnosis present

## 2021-03-09 DIAGNOSIS — Z20822 Contact with and (suspected) exposure to covid-19: Secondary | ICD-10-CM | POA: Diagnosis present

## 2021-03-09 DIAGNOSIS — Z87891 Personal history of nicotine dependence: Secondary | ICD-10-CM

## 2021-03-09 DIAGNOSIS — Z9114 Patient's other noncompliance with medication regimen: Secondary | ICD-10-CM

## 2021-03-09 DIAGNOSIS — R0602 Shortness of breath: Secondary | ICD-10-CM | POA: Diagnosis present

## 2021-03-09 DIAGNOSIS — I4581 Long QT syndrome: Secondary | ICD-10-CM | POA: Diagnosis present

## 2021-03-09 DIAGNOSIS — T383X6A Underdosing of insulin and oral hypoglycemic [antidiabetic] drugs, initial encounter: Secondary | ICD-10-CM | POA: Diagnosis present

## 2021-03-09 LAB — CBC WITH DIFFERENTIAL/PLATELET
Abs Immature Granulocytes: 0.54 10*3/uL — ABNORMAL HIGH (ref 0.00–0.07)
Basophils Absolute: 0.1 10*3/uL (ref 0.0–0.1)
Basophils Relative: 1 %
Eosinophils Absolute: 0 10*3/uL (ref 0.0–0.5)
Eosinophils Relative: 0 %
HCT: 50.9 % (ref 39.0–52.0)
Hemoglobin: 16.4 g/dL (ref 13.0–17.0)
Immature Granulocytes: 4 %
Lymphocytes Relative: 28 %
Lymphs Abs: 4 10*3/uL (ref 0.7–4.0)
MCH: 30.7 pg (ref 26.0–34.0)
MCHC: 32.2 g/dL (ref 30.0–36.0)
MCV: 95.1 fL (ref 80.0–100.0)
Monocytes Absolute: 1 10*3/uL (ref 0.1–1.0)
Monocytes Relative: 7 %
Neutro Abs: 9 10*3/uL — ABNORMAL HIGH (ref 1.7–7.7)
Neutrophils Relative %: 60 %
Platelets: 373 10*3/uL (ref 150–400)
RBC: 5.35 MIL/uL (ref 4.22–5.81)
RDW: 13 % (ref 11.5–15.5)
WBC: 14.6 10*3/uL — ABNORMAL HIGH (ref 4.0–10.5)
nRBC: 0 % (ref 0.0–0.2)

## 2021-03-09 LAB — BASIC METABOLIC PANEL
Anion gap: 21 — ABNORMAL HIGH (ref 5–15)
Anion gap: 12 (ref 5–15)
BUN: 15 mg/dL (ref 6–20)
BUN: 12 mg/dL (ref 6–20)
CO2: 9 mmol/L — ABNORMAL LOW (ref 22–32)
CO2: 13 mmol/L — ABNORMAL LOW (ref 22–32)
Calcium: 7.5 mg/dL — ABNORMAL LOW (ref 8.9–10.3)
Calcium: 7.9 mg/dL — ABNORMAL LOW (ref 8.9–10.3)
Chloride: 102 mmol/L (ref 98–111)
Chloride: 106 mmol/L (ref 98–111)
Creatinine, Ser: 0.78 mg/dL (ref 0.61–1.24)
Creatinine, Ser: 0.7 mg/dL (ref 0.61–1.24)
GFR, Estimated: >60 mL/min (ref >60)
GFR, Estimated: >60 mL/min (ref >60)
Glucose, Bld: 277 mg/dL — ABNORMAL HIGH (ref 70–99)
Glucose, Bld: 255 mg/dL — ABNORMAL HIGH (ref 70–99)
Potassium: 3.7 mmol/L (ref 3.5–5.1)
Potassium: 2.7 mmol/L — CL (ref 3.5–5.1)
Sodium: 132 mmol/L — ABNORMAL LOW (ref 135–145)
Sodium: 131 mmol/L — ABNORMAL LOW (ref 135–145)

## 2021-03-09 LAB — URINALYSIS, ROUTINE W REFLEX MICROSCOPIC
Bacteria, UA: NONE SEEN
Bilirubin Urine: NEGATIVE
Glucose, UA: >=500 mg/dL — AB
Ketones, ur: 80 mg/dL — AB
Leukocytes,Ua: NEGATIVE
Nitrite: NEGATIVE
Protein, ur: 100 mg/dL — AB
Specific Gravity, Urine: 1.025 (ref 1.005–1.030)
pH: 5 (ref 5.0–8.0)

## 2021-03-09 LAB — TROPONIN I (HIGH SENSITIVITY): Troponin I (High Sensitivity): 7 ng/L (ref <18)

## 2021-03-09 LAB — COMPREHENSIVE METABOLIC PANEL
ALT: 15 U/L (ref 0–44)
AST: 14 U/L — ABNORMAL LOW (ref 15–41)
Albumin: 4.3 g/dL (ref 3.5–5.0)
Alkaline Phosphatase: 99 U/L (ref 38–126)
BUN: 16 mg/dL (ref 6–20)
CO2: <7 mmol/L — ABNORMAL LOW (ref 22–32)
Calcium: 8.8 mg/dL — ABNORMAL LOW (ref 8.9–10.3)
Chloride: 100 mmol/L (ref 98–111)
Creatinine, Ser: 1.03 mg/dL (ref 0.61–1.24)
GFR, Estimated: >60 mL/min (ref >60)
Glucose, Bld: 398 mg/dL — ABNORMAL HIGH (ref 70–99)
Potassium: 3.9 mmol/L (ref 3.5–5.1)
Sodium: 126 mmol/L — ABNORMAL LOW (ref 135–145)
Total Bilirubin: 1.7 mg/dL — ABNORMAL HIGH (ref 0.3–1.2)
Total Protein: 7.4 g/dL (ref 6.5–8.1)

## 2021-03-09 LAB — GLUCOSE, CAPILLARY
Glucose-Capillary: 204 mg/dL — ABNORMAL HIGH (ref 70–99)
Glucose-Capillary: 211 mg/dL — ABNORMAL HIGH (ref 70–99)
Glucose-Capillary: 221 mg/dL — ABNORMAL HIGH (ref 70–99)
Glucose-Capillary: 232 mg/dL — ABNORMAL HIGH (ref 70–99)
Glucose-Capillary: 249 mg/dL — ABNORMAL HIGH (ref 70–99)
Glucose-Capillary: 254 mg/dL — ABNORMAL HIGH (ref 70–99)
Glucose-Capillary: 265 mg/dL — ABNORMAL HIGH (ref 70–99)

## 2021-03-09 LAB — BLOOD GAS, VENOUS
Acid-base deficit: 26.9 mmol/L — ABNORMAL HIGH (ref 0.0–2.0)
Bicarbonate: 5.2 mmol/L — ABNORMAL LOW (ref 20.0–28.0)
O2 Saturation: 42.7 %
Patient temperature: 37
pCO2, Ven: 26 mmHg — ABNORMAL LOW (ref 44.0–60.0)
pH, Ven: 6.91 — CL (ref 7.250–7.430)
pO2, Ven: 43 mmHg (ref 32.0–45.0)

## 2021-03-09 LAB — URINALYSIS, COMPLETE (UACMP) WITH MICROSCOPIC
Bilirubin Urine: NEGATIVE
Glucose, UA: >=500 mg/dL — AB
Ketones, ur: 80 mg/dL — AB
Leukocytes,Ua: NEGATIVE
Nitrite: NEGATIVE
Protein, ur: 100 mg/dL — AB
Specific Gravity, Urine: 1.024 (ref 1.005–1.030)
pH: 5 (ref 5.0–8.0)

## 2021-03-09 LAB — RESP PANEL BY RT-PCR (FLU A&B, COVID) ARPGX2
Influenza A by PCR: NEGATIVE
Influenza B by PCR: NEGATIVE
SARS Coronavirus 2 by RT PCR: NEGATIVE

## 2021-03-09 LAB — LACTIC ACID, PLASMA
Lactic Acid, Venous: 2.9 mmol/L (ref 0.5–1.9)
Lactic Acid, Venous: 3.3 mmol/L (ref 0.5–1.9)
Lactic Acid, Venous: 1.2 mmol/L (ref 0.5–1.9)
Lactic Acid, Venous: 0.8 mmol/L (ref 0.5–1.9)

## 2021-03-09 LAB — CBG MONITORING, ED
Glucose-Capillary: 370 mg/dL — ABNORMAL HIGH (ref 70–99)
Glucose-Capillary: 367 mg/dL — ABNORMAL HIGH (ref 70–99)
Glucose-Capillary: 309 mg/dL — ABNORMAL HIGH (ref 70–99)

## 2021-03-09 LAB — BETA-HYDROXYBUTYRIC ACID
Beta-Hydroxybutyric Acid: >8 mmol/L — ABNORMAL HIGH (ref 0.05–0.27)
Beta-Hydroxybutyric Acid: 2.14 mmol/L — ABNORMAL HIGH (ref 0.05–0.27)

## 2021-03-09 MED ORDER — ATORVASTATIN CALCIUM 20 MG PO TABS
80.0000 mg | ORAL_TABLET | Freq: Every day | ORAL | Status: DC
  Administered 2021-03-10 – 2021-03-11 (×3): 80 mg via ORAL
  Filled 2021-03-09 (×3): qty 4

## 2021-03-09 MED ORDER — DEXTROSE 50 % IV SOLN: 0.0000 mL | INTRAVENOUS | Status: DC | PRN

## 2021-03-09 MED ORDER — INSULIN REGULAR(HUMAN) IN NACL 100-0.9 UT/100ML-% IV SOLN
INTRAVENOUS | Status: DC
  Administered 2021-03-09: 4.8 [IU]/h via INTRAVENOUS

## 2021-03-09 MED ORDER — ACETAMINOPHEN 325 MG PO TABS: 650.0000 mg | ORAL_TABLET | Freq: Four times a day (QID) | ORAL | Status: DC | PRN

## 2021-03-09 MED ORDER — POTASSIUM CHLORIDE 10 MEQ/100ML IV SOLN
10.0000 meq | INTRAVENOUS | Status: AC
  Administered 2021-03-09 (×2): 10 meq via INTRAVENOUS
  Filled 2021-03-09 (×4): qty 100

## 2021-03-09 MED ORDER — ATORVASTATIN CALCIUM 20 MG PO TABS: 80.0000 mg | ORAL_TABLET | Freq: Every day | ORAL | Status: DC

## 2021-03-09 MED ORDER — MAGNESIUM HYDROXIDE 400 MG/5ML PO SUSP: 30.0000 mL | Freq: Every day | ORAL | Status: DC | PRN

## 2021-03-09 MED ORDER — LACTATED RINGERS IV SOLN: INTRAVENOUS | Status: DC

## 2021-03-09 MED ORDER — OMEGA-3-ACID ETHYL ESTERS 1 G PO CAPS
2.0000 g | ORAL_CAPSULE | Freq: Two times a day (BID) | ORAL | Status: DC
  Administered 2021-03-10 – 2021-03-11 (×4): 2 g via ORAL
  Filled 2021-03-09 (×5): qty 2

## 2021-03-09 MED ORDER — ENOXAPARIN SODIUM 40 MG/0.4ML IJ SOSY
40.0000 mg | PREFILLED_SYRINGE | INTRAMUSCULAR | Status: DC
  Administered 2021-03-10 (×2): 40 mg via SUBCUTANEOUS
  Filled 2021-03-09 (×2): qty 0.4

## 2021-03-09 MED ORDER — INSULIN REGULAR(HUMAN) IN NACL 100-0.9 UT/100ML-% IV SOLN
INTRAVENOUS | Status: DC
  Administered 2021-03-09: 4.8 [IU]/h via INTRAVENOUS
  Filled 2021-03-09: qty 100

## 2021-03-09 MED ORDER — ONDANSETRON HCL 4 MG/2ML IJ SOLN: 4.0000 mg | Freq: Four times a day (QID) | INTRAMUSCULAR | Status: DC | PRN

## 2021-03-09 MED ORDER — DEXTROSE IN LACTATED RINGERS 5 % IV SOLN: INTRAVENOUS | Status: DC

## 2021-03-09 MED ORDER — ACETAMINOPHEN 650 MG RE SUPP: 650.0000 mg | Freq: Four times a day (QID) | RECTAL | Status: DC | PRN

## 2021-03-09 MED ORDER — LACTATED RINGERS IV BOLUS
1000.0000 mL | Freq: Once | INTRAVENOUS | Status: AC
  Administered 2021-03-09: 1000 mL via INTRAVENOUS

## 2021-03-09 MED ORDER — LACTATED RINGERS IV BOLUS: 1000.0000 mL | Freq: Once | INTRAVENOUS | Status: DC

## 2021-03-09 MED ORDER — SODIUM CHLORIDE 0.9 % IV BOLUS
1000.0000 mL | Freq: Once | INTRAVENOUS | Status: AC
  Administered 2021-03-09: 1000 mL via INTRAVENOUS

## 2021-03-09 MED ORDER — ONDANSETRON HCL 4 MG PO TABS: 4.0000 mg | ORAL_TABLET | Freq: Four times a day (QID) | ORAL | Status: DC | PRN

## 2021-03-09 MED ORDER — OMEGA-3-ACID ETHYL ESTERS 1 G PO CAPS: 2.0000 g | ORAL_CAPSULE | Freq: Two times a day (BID) | ORAL | Status: DC

## 2021-03-09 MED ORDER — TRAZODONE HCL 50 MG PO TABS: 25.0000 mg | ORAL_TABLET | Freq: Every evening | ORAL | Status: DC | PRN

## 2021-03-09 MED ORDER — POTASSIUM CHLORIDE 10 MEQ/100ML IV SOLN
10.0000 meq | INTRAVENOUS | Status: AC
  Administered 2021-03-09 (×2): 10 meq via INTRAVENOUS
  Filled 2021-03-09 (×2): qty 100

## 2021-03-09 MED ORDER — SODIUM BICARBONATE 8.4 % IV SOLN
100.0000 meq | Freq: Once | INTRAVENOUS | Status: AC
  Administered 2021-03-09: 100 meq via INTRAVENOUS

## 2021-03-09 NOTE — ED Notes (Signed)
Per endotool... no change in insulin.

## 2021-03-09 NOTE — ED Notes (Signed)
BGL 370

## 2021-03-09 NOTE — H&P (Addendum)
[place]   PATIENT NAME: Joe Kelly    MR#:  [id number]  DATE OF BIRTH:  [birth year]  DATE OF ADMISSION:  03/09/2021  PRIMARY CARE PHYSICIAN: Support, [place]   Patient is coming from: Home  REQUESTING/REFERRING PHYSICIAN: Merlyn Lot, MD  CHIEF COMPLAINT:   Chief Complaint  Patient presents with   Shortness of Breath    HISTORY OF PRESENT ILLNESS:  Joe Kelly is a 30 y.o. Hispanic male with medical history significant for  diabetes mellitus and dyslipidemia, presented to the emergency room with acute onset of intractable nausea and vomiting with associated crampy abdominal pain with mild epigastric heartburn over the last 3 days with associated dyspnea and generalized fatigue and malaise.  He denies any fever or chills.  No cough or wheezing.  He admits to polyuria and polydipsia.  No chest pain or palpitations.  No dysuria or flank pain.  He has been vaccinated for COVID-19.  ED Course: Upon presentation to the emergency room, blood pressure was 146/89 with a heart rate of 115 with respiratory to 30 approximately 90% on room air.  Labs reviewed venous blood gas with pH 6.91 with HCO3 5.2.  CMP was remarkable for hyponatremia 126 and CO2 less than 7 with hyperglycemia of 398 anion gap of that was incalculable.  Total bili was 1.7.  Lactic acid was 2.9 and CBC showed leukocytosis of 14.6.  Influenza antigens and COVID-19 PCR came back negative.  Urinalysis showed more than 500 glucose.   EKG as reviewed by me : showed sinus tachycardia with rate 114 with PVCs, right axis deviation, T wave inversion inferiorly and early repolarization with prolonged QT interval with QTC of 551 MS. Imaging: Portable chest x-ray showed no acute cardiopulmonary disease.  The patient was given 1 L bolus of IV lactated ringer and 1 L of IV normal saline and was ordered IV insulin drip per Endo tool.  He will be admitted to a stepdown  unit for further evaluation and management. PAST MEDICAL HISTORY:   Past Medical History:  Diagnosis Date   Diabetes mellitus without complication ([place])    Hyperlipidemia   The patient is not sure if it is type I or type 2 diabetes.  PAST SURGICAL HISTORY:   Past Surgical History:  Procedure Laterality Date   NO PAST SURGERIES      SOCIAL HISTORY:   Social History   Tobacco Use   Smoking status: Former    Pack years: 0.00    Types: Cigarettes   Smokeless tobacco: Never   Tobacco comments:    1 cigarette 3 times per week  Substance Use Topics   Alcohol use: Not Currently    FAMILY HISTORY:   Family History  Problem Relation Age of Onset   Diabetes Mother    Healthy Father     DRUG ALLERGIES:   Allergies  Allergen Reactions   Coconut Flavor     Dizziness     REVIEW OF SYSTEMS:   ROS As per history of present illness. All pertinent systems were reviewed above. Constitutional, HEENT, cardiovascular, respiratory, GI, GU, musculoskeletal, neuro, psychiatric, endocrine, integumentary and hematologic systems were reviewed and are otherwise negative/unremarkable except for positive findings mentioned above in the HPI.   MEDICATIONS AT HOME:   Prior to Admission medications   Medication Sig Start Date End Date Taking? Authorizing Provider  atorvastatin (LIPITOR) 80 MG tablet Take 1 tablet (80 mg total) by mouth daily.  03/08/21 04/07/21  Elwyn Reach, PA-C  blood glucose meter kit and supplies KIT Dispense based on patient and insurance preference. Use up to four times daily as directed to monitor blood glucose 01/17/21   Tukov-Yual, Arlyss Gandy, NP  Insulin Glargine (BASAGLAR KWIKPEN) 100 UNIT/ML Inject 13 Units into the skin at bedtime. 01/23/21   Iloabachie, Chioma E, NP  insulin lispro (HUMALOG KWIKPEN) 100 UNIT/ML KwikPen Inject 9 Units into the skin 3 (three) times daily before meals. 01/23/21   Iloabachie, Chioma E, NP  omega-3 acid ethyl esters (LOVAZA) 1 g  capsule Take 2 capsules (2 g total) by mouth 2 (two) times daily. 03/08/21 04/07/21  Elwyn Reach, PA-C      VITAL SIGNS:  Blood pressure (!) 160/76, pulse (!) 111, temperature (!) 97.1 F (36.2 C), temperature source Rectal, resp. rate (!) 39, SpO2 100 %.  PHYSICAL EXAMINATION:  Physical Exam  GENERAL:  30 y.o.-year-old Hispanic male patient lying in the bed with no acute distress.  EYES: Pupils equal, round, reactive to light and accommodation. No scleral icterus. Extraocular muscles intact.  HEENT: Head atraumatic, normocephalic. Oropharynx with dry mucous membrane and tongue and nasopharynx clear.  NECK:  Supple, no jugular venous distention. No thyroid enlargement, no tenderness.  LUNGS: Normal breath sounds bilaterally, no wheezing, rales,rhonchi or crepitation. No use of accessory muscles of respiration.  CARDIOVASCULAR: Regular rate and rhythm, S1, S2 normal. No murmurs, rubs, or gallops.  ABDOMEN: Soft, nondistended, nontender. Bowel sounds present. No organomegaly or mass.  EXTREMITIES: No pedal edema, cyanosis, or clubbing.  NEUROLOGIC: Cranial nerves II through XII are intact. Muscle strength 5/5 in all extremities. Sensation intact. Gait not checked.  PSYCHIATRIC: The patient is alert and oriented x 3.  Normal affect and good eye contact. SKIN: No obvious rash, lesion, or ulcer.   LABORATORY PANEL:   CBC Recent Labs  Lab 03/09/21 1412  WBC 14.6*  HGB 16.4  HCT 50.9  PLT 373   ------------------------------------------------------------------------------------------------------------------  Chemistries  Recent Labs  Lab 03/09/21 1412  NA 126*  K 3.9  CL 100  CO2 <7*  GLUCOSE 398*  BUN 16  CREATININE 1.03  CALCIUM 8.8*  AST 14*  ALT 15  ALKPHOS 99  BILITOT 1.7*   ------------------------------------------------------------------------------------------------------------------  Cardiac Enzymes No results for input(s): TROPONINI in the last 168  hours. ------------------------------------------------------------------------------------------------------------------  RADIOLOGY:  DG Chest Portable 1 View  Result Date: 03/09/2021 CLINICAL DATA:  Shortness of breath. EXAM: PORTABLE CHEST 1 VIEW COMPARISON:  December 05, 2020. FINDINGS: The heart size and mediastinal contours are within normal limits. Both lungs are clear. The visualized skeletal structures are unremarkable. IMPRESSION: No active disease. Electronically Signed   By: Marijo Conception M.D.   On: 03/09/2021 14:53      IMPRESSION AND PLAN:  Active Problems:   DKA (diabetic ketoacidosis) ([place])  1.  DKA with uncontrolled diabetes mellitus. - The patient will be admitted to a stepdown bed. - He will be placed on IV insulin drip per EndoTool. - The patient will be aggressively hydrated with IV normal saline. - Will follow serial BMPs. - Given his persistence Kussmaul's breathing and pH of 6.91 we will give him sodium bicarbonate.  2.  Dyslipidemia. - We will continue statin therapy and Lovenox.  3.  Leukocytosis. - This likely secondary to stress demargination.  DVT prophylaxis: Lovenox. Code Status: full code. Family Communication:  The plan of care was discussed in details with the patient (and family). I answered all questions.  The patient agreed to proceed with the above mentioned plan. Further management will depend upon hospital course. Disposition Plan: Back to previous home environment Consults called: none. All the records are reviewed and case discussed with ED provider.  Status is: Inpatient  Remains inpatient appropriate because:Ongoing diagnostic testing needed not appropriate for outpatient work up, Unsafe d/c plan, IV treatments appropriate due to intensity of illness or inability to take PO, and Inpatient level of care appropriate due to severity of illness  Dispo: The patient is from: Home              Anticipated d/c is to: Home              Patient  currently is not medically stable to d/c.   Difficult to place patient No   TOTAL TIME TAKING CARE OF THIS PATIENT: 55 minutes.    Christel Mormon M.D on 03/09/2021 at 3:24 PM  [facility]   From 7 PM-7 AM, contact night-coverage [email or web address]  CC: Primary care physician; Support, [place]

## 2021-03-09 NOTE — ED Notes (Signed)
BGL 309

## 2021-03-09 NOTE — ED Triage Notes (Signed)
BIB EMS from home.  Pt states started at 1000AM and he cannot breath. BGL 317. CP since tuesday. pain worse during inspiration. Pt denies missing any of his medicines.  HR 130 RR 40 150/86  3 months ago admitted for same thing.

## 2021-03-09 NOTE — ED Notes (Signed)
Report called to ICU 12.

## 2021-03-09 NOTE — ED Provider Notes (Signed)
[facility] Emergency Department Provider Note    Event Date/Time   First MD Initiated Contact with Patient 03/09/21 1406     (approximate)  I have reviewed the triage vital signs and the nursing notes.   HISTORY  Chief Complaint Shortness of Breath    HPI Joe Kelly is a 30 y.o. male history of diabetes presents to the ER for evaluation of nausea vomiting shortness of breath generalized malaise worsening over the past few days.  Does have a history of DKA.  States has been compliant with his medications.  No measured fevers or chills.  No known sick contacts.  Past Medical History:  Diagnosis Date   Diabetes mellitus without complication ([place])    Hyperlipidemia    Family History  Problem Relation Age of Onset   Diabetes Mother    Healthy Father    Past Surgical History:  Procedure Laterality Date   NO PAST SURGERIES     Patient Active Problem List   Diagnosis Date Noted   Type 2 diabetes mellitus with hyperglycemia ([place]) 01/23/2021   Hyperlipidemia associated with type 2 diabetes mellitus ([place]) 01/23/2021   Hypokalemia    DKA (diabetic ketoacidosis) ([place]) 12/05/2020   Hyponatremia 12/05/2020   Dehydration 12/05/2020      Prior to Admission medications   Medication Sig Start Date End Date Taking? Authorizing Provider  atorvastatin (LIPITOR) 80 MG tablet Take 1 tablet (80 mg total) by mouth daily. 03/08/21 04/07/21  Elwyn Reach, PA-C  blood glucose meter kit and supplies KIT Dispense based on patient and insurance preference. Use up to four times daily as directed to monitor blood glucose 01/17/21   Tukov-Yual, Arlyss Gandy, NP  Insulin Glargine (BASAGLAR KWIKPEN) 100 UNIT/ML Inject 13 Units into the skin at bedtime. 01/23/21   Iloabachie, Chioma E, NP  insulin lispro (HUMALOG KWIKPEN) 100 UNIT/ML KwikPen Inject 9 Units into the skin 3 (three) times daily before meals. 01/23/21   Iloabachie, Chioma E, NP  omega-3 acid ethyl  esters (LOVAZA) 1 g capsule Take 2 capsules (2 g total) by mouth 2 (two) times daily. 03/08/21 04/07/21  Elwyn Reach, PA-C    Allergies Coconut flavor    Social History Social History   Tobacco Use   Smoking status: Former    Pack years: 0.00    Types: Cigarettes   Smokeless tobacco: Never   Tobacco comments:    1 cigarette 3 times per week  Vaping Use   Vaping Use: Never used  Substance Use Topics   Alcohol use: Not Currently   Drug use: Not Currently    Review of Systems Patient denies headaches, rhinorrhea, blurry vision, numbness, shortness of breath, chest pain, edema, cough, abdominal pain, nausea, vomiting, diarrhea, dysuria, fevers, rashes or hallucinations unless otherwise stated above in HPI. ____________________________________________   PHYSICAL EXAM:  VITAL SIGNS: Vitals:   03/09/21 1430 03/09/21 1500  BP: (!) 157/75 (!) 160/76  Pulse: (!) 103 (!) 111  Resp: (!) 35 (!) 39  Temp:    SpO2: 100% 100%    Constitutional: Alert and oriented. Critically ill appearing Eyes: Conjunctivae are normal.  Head: Atraumatic. Nose: No congestion/rhinnorhea. Mouth/Throat: Mucous membranes are moist.   Neck: No stridor. Painless ROM.  Cardiovascular: tachycardic rate, regular rhythm. Grossly normal heart sounds.  Good peripheral circulation. Respiratory: tachypnea with kussmaul resp.  . Lungs CTAB. Gastrointestinal: Soft and nontender. No distention. No abdominal bruits. No CVA tenderness. Genitourinary: deferred Musculoskeletal: No lower extremity tenderness nor edema.  No joint effusions. Neurologic:  Normal speech and language. No gross focal neurologic deficits are appreciated. No facial droop Skin:  Skin is warm, dry and intact. No rash noted. Psychiatric: anxious appearing, cooperative  ____________________________________________   LABS (all labs ordered are listed, but only abnormal results are displayed)  Results for orders placed or performed during  the hospital encounter of 03/09/21 (from the past 24 hour(s))  CBG monitoring, ED     Status: Abnormal   Collection Time: 03/09/21  2:07 PM  Result Value Ref Range   Glucose-Capillary 370 (H) 70 - 99 mg/dL   Comment 1 Notify RN    Comment 2 Document in Chart   CBC with Differential     Status: Abnormal   Collection Time: 03/09/21  2:12 PM  Result Value Ref Range   WBC 14.6 (H) 4.0 - 10.5 K/uL   RBC 5.35 4.22 - 5.81 MIL/uL   Hemoglobin 16.4 13.0 - 17.0 g/dL   HCT 50.9 39.0 - 52.0 %   MCV 95.1 80.0 - 100.0 fL   MCH 30.7 26.0 - 34.0 pg   MCHC 32.2 30.0 - 36.0 g/dL   RDW 13.0 11.5 - 15.5 %   Platelets 373 150 - 400 K/uL   nRBC 0.0 0.0 - 0.2 %   Neutrophils Relative % 60 %   Neutro Abs 9.0 (H) 1.7 - 7.7 K/uL   Lymphocytes Relative 28 %   Lymphs Abs 4.0 0.7 - 4.0 K/uL   Monocytes Relative 7 %   Monocytes Absolute 1.0 0.1 - 1.0 K/uL   Eosinophils Relative 0 %   Eosinophils Absolute 0.0 0.0 - 0.5 K/uL   Basophils Relative 1 %   Basophils Absolute 0.1 0.0 - 0.1 K/uL   Immature Granulocytes 4 %   Abs Immature Granulocytes 0.54 (H) 0.00 - 0.07 K/uL  Comprehensive metabolic panel     Status: Abnormal   Collection Time: 03/09/21  2:12 PM  Result Value Ref Range   Sodium 126 (L) 135 - 145 mmol/L   Potassium 3.9 3.5 - 5.1 mmol/L   Chloride 100 98 - 111 mmol/L   CO2 <7 (L) 22 - 32 mmol/L   Glucose, Bld 398 (H) 70 - 99 mg/dL   BUN 16 6 - 20 mg/dL   Creatinine, Ser 1.03 0.61 - 1.24 mg/dL   Calcium 8.8 (L) 8.9 - 10.3 mg/dL   Total Protein 7.4 6.5 - 8.1 g/dL   Albumin 4.3 3.5 - 5.0 g/dL   AST 14 (L) 15 - 41 U/L   ALT 15 0 - 44 U/L   Alkaline Phosphatase 99 38 - 126 U/L   Total Bilirubin 1.7 (H) 0.3 - 1.2 mg/dL   GFR, Estimated >60 >60 mL/min   Anion gap NOT CALCULATED 5 - 15  Lactic acid, plasma     Status: Abnormal   Collection Time: 03/09/21  2:12 PM  Result Value Ref Range   Lactic Acid, Venous 2.9 (HH) 0.5 - 1.9 mmol/L  Blood gas, venous     Status: Abnormal   Collection Time:  03/09/21  2:12 PM  Result Value Ref Range   pH, Ven 6.91 (LL) 7.250 - 7.430   pCO2, Ven 26 (L) 44.0 - 60.0 mmHg   pO2, Ven 43.0 32.0 - 45.0 mmHg   Bicarbonate 5.2 (L) 20.0 - 28.0 mmol/L   Acid-base deficit 26.9 (H) 0.0 - 2.0 mmol/L   O2 Saturation 42.7 %   Patient temperature 37.0    Sample type VENOUS  Urinalysis, Complete w Microscopic     Status: Abnormal   Collection Time: 03/09/21  2:12 PM  Result Value Ref Range   Color, Urine YELLOW (A) YELLOW   APPearance CLEAR (A) CLEAR   Specific Gravity, Urine 1.024 1.005 - 1.030   pH 5.0 5.0 - 8.0   Glucose, UA >=500 (A) NEGATIVE mg/dL   Hgb urine dipstick SMALL (A) NEGATIVE   Bilirubin Urine NEGATIVE NEGATIVE   Ketones, ur 80 (A) NEGATIVE mg/dL   Protein, ur 100 (A) NEGATIVE mg/dL   Nitrite NEGATIVE NEGATIVE   Leukocytes,Ua NEGATIVE NEGATIVE   WBC, UA 0-5 0 - 5 WBC/hpf   Bacteria, UA RARE (A) NONE SEEN   Squamous Epithelial / LPF 0-5 0 - 5   ____________________________________________  EKG My review and personal interpretation at Time: 14:16   Indication: dka  Rate: 105  Rhythm: sinus Axis: normal Other: nonspecific st abn, no stemi, prolonged qt ____________________________________________  RADIOLOGY  I personally reviewed all radiographic images ordered to evaluate for the above acute complaints and reviewed radiology reports and findings.  These findings were personally discussed with the patient.  Please see medical record for radiology report.  ____________________________________________   PROCEDURES  Procedure(s) performed:  .Critical Care  Date/Time: 03/09/2021 2:59 PM Performed by: Merlyn Lot, MD Authorized by: Merlyn Lot, MD   Critical care provider statement:    Critical care time (minutes):  40   Critical care time was exclusive of:  Separately billable procedures and treating other patients   Critical care was necessary to treat or prevent imminent or life-threatening deterioration of the  following conditions:  Endocrine crisis   Critical care was time spent personally by me on the following activities:  Development of treatment plan with patient or surrogate, discussions with consultants, evaluation of patient's response to treatment, examination of patient, obtaining history from patient or surrogate, ordering and performing treatments and interventions, ordering and review of laboratory studies, ordering and review of radiographic studies, pulse oximetry, re-evaluation of patient's condition and review of old charts    Critical Care performed: yes ____________________________________________   INITIAL IMPRESSION / [place] / ED COURSE  Pertinent labs & imaging results that were available during my care of the patient were reviewed by me and considered in my medical decision making (see chart for details).   DDX: dka, hHns, dehydration, sepsis, electrolyte abn,  Joe Kelly is a 30 y.o. who presents to the ED with [place] as described above.  Patient arrives ill-appearing with coo small respirations hyperglycemic and I am highly suspicious for DKA blood work will be sent for above differential.  IV access obtained given IV fluids.  Clinical Course as of 03/09/21 1503  Fri Mar 09, 2021  1431 VBG with pH of 6.9.  Condition concerning for DKA.  Will continue with additional IV hydration duration resuscitation. [PR]  8115 Patient with profound high anion gap metabolic acidosis.  We will continue with IV hydration resuscitation will order insulin infusion.  Will consult hospitalist for admission. [PR]    Clinical Course User Index [PR] Merlyn Lot, MD    The patient was evaluated in Emergency Department today for the symptoms described in the history of present illness. He/she was evaluated in the context of the global COVID-19 pandemic, which necessitated consideration that the patient might be at risk for infection with the SARS-CoV-2 virus that  causes COVID-19. Institutional protocols and algorithms that pertain to the evaluation of patients at risk for COVID-19 are in  a state of rapid change based on information released by regulatory bodies including the CDC and federal and state organizations. These policies and algorithms were followed during the patient's care in the ED.  As part of my medical decision making, I reviewed the following data within the [facility] notes reviewed and incorporated, Labs reviewed, notes from prior ED visits and [place] Controlled Substance Database   ____________________________________________   FINAL CLINICAL IMPRESSION(S) / ED DIAGNOSES  Final diagnoses:  Diabetic ketoacidosis without coma associated with type 2 diabetes mellitus ([place])      NEW MEDICATIONS STARTED DURING THIS VISIT:  New Prescriptions   No medications on file     Note:  This document was prepared using Dragon voice recognition software and may include unintentional dictation errors.    Merlyn Lot, MD 03/09/21 [phone]

## 2021-03-10 DIAGNOSIS — D72829 Elevated white blood cell count, unspecified: Secondary | ICD-10-CM

## 2021-03-10 DIAGNOSIS — E876 Hypokalemia: Secondary | ICD-10-CM

## 2021-03-10 DIAGNOSIS — E1310 Other specified diabetes mellitus with ketoacidosis without coma: Secondary | ICD-10-CM

## 2021-03-10 DIAGNOSIS — E785 Hyperlipidemia, unspecified: Secondary | ICD-10-CM

## 2021-03-10 LAB — BASIC METABOLIC PANEL
Anion gap: 6 (ref 5–15)
Anion gap: 7 (ref 5–15)
Anion gap: 5 (ref 5–15)
Anion gap: 5 (ref 5–15)
Anion gap: 6 (ref 5–15)
Anion gap: 6 (ref 5–15)
BUN: 10 mg/dL (ref 6–20)
BUN: 10 mg/dL (ref 6–20)
BUN: 10 mg/dL (ref 6–20)
BUN: 10 mg/dL (ref 6–20)
BUN: 9 mg/dL (ref 6–20)
BUN: 6 mg/dL (ref 6–20)
CO2: 19 mmol/L — ABNORMAL LOW (ref 22–32)
CO2: 19 mmol/L — ABNORMAL LOW (ref 22–32)
CO2: 21 mmol/L — ABNORMAL LOW (ref 22–32)
CO2: 23 mmol/L (ref 22–32)
CO2: 23 mmol/L (ref 22–32)
CO2: 25 mmol/L (ref 22–32)
Calcium: 7.8 mg/dL — ABNORMAL LOW (ref 8.9–10.3)
Calcium: 7.8 mg/dL — ABNORMAL LOW (ref 8.9–10.3)
Calcium: 7.9 mg/dL — ABNORMAL LOW (ref 8.9–10.3)
Calcium: 8 mg/dL — ABNORMAL LOW (ref 8.9–10.3)
Calcium: 7.9 mg/dL — ABNORMAL LOW (ref 8.9–10.3)
Calcium: 7.5 mg/dL — ABNORMAL LOW (ref 8.9–10.3)
Chloride: 106 mmol/L (ref 98–111)
Chloride: 105 mmol/L (ref 98–111)
Chloride: 105 mmol/L (ref 98–111)
Chloride: 105 mmol/L (ref 98–111)
Chloride: 103 mmol/L (ref 98–111)
Chloride: 98 mmol/L (ref 98–111)
Creatinine, Ser: 0.52 mg/dL — ABNORMAL LOW (ref 0.61–1.24)
Creatinine, Ser: 0.52 mg/dL — ABNORMAL LOW (ref 0.61–1.24)
Creatinine, Ser: 0.49 mg/dL — ABNORMAL LOW (ref 0.61–1.24)
Creatinine, Ser: 0.46 mg/dL — ABNORMAL LOW (ref 0.61–1.24)
Creatinine, Ser: 0.42 mg/dL — ABNORMAL LOW (ref 0.61–1.24)
Creatinine, Ser: 0.47 mg/dL — ABNORMAL LOW (ref 0.61–1.24)
GFR, Estimated: >60 mL/min (ref >60)
GFR, Estimated: >60 mL/min (ref >60)
GFR, Estimated: >60 mL/min (ref >60)
GFR, Estimated: >60 mL/min (ref >60)
GFR, Estimated: >60 mL/min (ref >60)
GFR, Estimated: >60 mL/min (ref >60)
Glucose, Bld: 216 mg/dL — ABNORMAL HIGH (ref 70–99)
Glucose, Bld: 203 mg/dL — ABNORMAL HIGH (ref 70–99)
Glucose, Bld: 195 mg/dL — ABNORMAL HIGH (ref 70–99)
Glucose, Bld: 162 mg/dL — ABNORMAL HIGH (ref 70–99)
Glucose, Bld: 136 mg/dL — ABNORMAL HIGH (ref 70–99)
Glucose, Bld: 281 mg/dL — ABNORMAL HIGH (ref 70–99)
Potassium: 2.3 mmol/L — CL (ref 3.5–5.1)
Potassium: 2.1 mmol/L — CL (ref 3.5–5.1)
Potassium: 2.3 mmol/L — CL (ref 3.5–5.1)
Potassium: 2.3 mmol/L — CL (ref 3.5–5.1)
Potassium: 2.7 mmol/L — CL (ref 3.5–5.1)
Potassium: 2.9 mmol/L — ABNORMAL LOW (ref 3.5–5.1)
Sodium: 131 mmol/L — ABNORMAL LOW (ref 135–145)
Sodium: 131 mmol/L — ABNORMAL LOW (ref 135–145)
Sodium: 131 mmol/L — ABNORMAL LOW (ref 135–145)
Sodium: 133 mmol/L — ABNORMAL LOW (ref 135–145)
Sodium: 132 mmol/L — ABNORMAL LOW (ref 135–145)
Sodium: 129 mmol/L — ABNORMAL LOW (ref 135–145)

## 2021-03-10 LAB — GLUCOSE, CAPILLARY
Glucose-Capillary: 192 mg/dL — ABNORMAL HIGH (ref 70–99)
Glucose-Capillary: 174 mg/dL — ABNORMAL HIGH (ref 70–99)
Glucose-Capillary: 175 mg/dL — ABNORMAL HIGH (ref 70–99)
Glucose-Capillary: 183 mg/dL — ABNORMAL HIGH (ref 70–99)
Glucose-Capillary: 132 mg/dL — ABNORMAL HIGH (ref 70–99)
Glucose-Capillary: 148 mg/dL — ABNORMAL HIGH (ref 70–99)
Glucose-Capillary: 143 mg/dL — ABNORMAL HIGH (ref 70–99)
Glucose-Capillary: 125 mg/dL — ABNORMAL HIGH (ref 70–99)
Glucose-Capillary: 97 mg/dL (ref 70–99)
Glucose-Capillary: 287 mg/dL — ABNORMAL HIGH (ref 70–99)

## 2021-03-10 LAB — BETA-HYDROXYBUTYRIC ACID
Beta-Hydroxybutyric Acid: 1.53 mmol/L — ABNORMAL HIGH (ref 0.05–0.27)
Beta-Hydroxybutyric Acid: 0.9 mmol/L — ABNORMAL HIGH (ref 0.05–0.27)
Beta-Hydroxybutyric Acid: 1.34 mmol/L — ABNORMAL HIGH (ref 0.05–0.27)

## 2021-03-10 LAB — MAGNESIUM: Magnesium: 1.5 mg/dL — ABNORMAL LOW (ref 1.7–2.4)

## 2021-03-10 MED ORDER — INSULIN ASPART 100 UNIT/ML IJ SOLN
0.0000 [IU] | Freq: Every day | INTRAMUSCULAR | Status: DC
  Administered 2021-03-10: 3 [IU] via SUBCUTANEOUS
  Filled 2021-03-10: qty 1

## 2021-03-10 MED ORDER — POTASSIUM CHLORIDE 10 MEQ/100ML IV SOLN
10.0000 meq | INTRAVENOUS | Status: AC
  Administered 2021-03-10 (×3): 10 meq via INTRAVENOUS
  Filled 2021-03-10 (×3): qty 100

## 2021-03-10 MED ORDER — MAGNESIUM SULFATE 4 GM/100ML IV SOLN
4.0000 g | Freq: Once | INTRAVENOUS | Status: AC
  Administered 2021-03-10: 4 g via INTRAVENOUS
  Filled 2021-03-10: qty 100

## 2021-03-10 MED ORDER — INSULIN GLARGINE 100 UNIT/ML ~~LOC~~ SOLN
10.0000 [IU] | SUBCUTANEOUS | Status: DC
  Administered 2021-03-10 – 2021-03-11 (×2): 10 [IU] via SUBCUTANEOUS
  Filled 2021-03-10 (×3): qty 0.1

## 2021-03-10 MED ORDER — POTASSIUM CHLORIDE CRYS ER 20 MEQ PO TBCR
40.0000 meq | EXTENDED_RELEASE_TABLET | Freq: Three times a day (TID) | ORAL | Status: AC
  Administered 2021-03-10 (×3): 40 meq via ORAL
  Filled 2021-03-10 (×3): qty 2

## 2021-03-10 MED ORDER — INSULIN ASPART 100 UNIT/ML IJ SOLN
0.0000 [IU] | Freq: Three times a day (TID) | INTRAMUSCULAR | Status: DC
  Administered 2021-03-10 – 2021-03-11 (×3): 1 [IU] via SUBCUTANEOUS
  Administered 2021-03-11 (×2): 2 [IU] via SUBCUTANEOUS
  Filled 2021-03-10 (×5): qty 1

## 2021-03-10 MED ORDER — POTASSIUM CHLORIDE 10 MEQ/100ML IV SOLN
10.0000 meq | INTRAVENOUS | Status: AC
  Administered 2021-03-10 (×2): 10 meq via INTRAVENOUS
  Filled 2021-03-10 (×2): qty 100

## 2021-03-10 MED ORDER — INSULIN ASPART 100 UNIT/ML IJ SOLN
5.0000 [IU] | Freq: Three times a day (TID) | INTRAMUSCULAR | Status: DC
  Administered 2021-03-10 – 2021-03-11 (×4): 5 [IU] via SUBCUTANEOUS
  Filled 2021-03-10 (×4): qty 1

## 2021-03-10 NOTE — Progress Notes (Signed)
2137: 10 MEQ Potasium IVPB hung  2145: Provider Dr. Para March notified of pt 2.7 Potassium, via secure chat. "Hi, Pt here admitted with DKA today on insulin drip .  Repeat labs resulted at approx 2000 Potasium 2.7,  Sodium 131 , calcium 7.9. Pt received total of 10 MEQ IVPB potasium given second 10 meq hung approx 15 mins ago. " Provider states to notify pharmacy,   2151 pharmacy contacted via phone per provider order. Spoke with Jason,RPH.   2154: pharmacy notified via secure chat they are not consulted on pt and that provider should manage.   2159: Provider acknowledges potasium is being treated with built in order set.   0125: Contacted by lab for critical potasium of 2.1. Provider notified via secure chat   [phone]: Provider placed potassium replacement orders for patient.   Pt remains asymptomatic to hypokalemia,   0645: Provider notified the following "Pt potassium 2.3 post 10 mew IVPB x 3, reported from lab @ [phone]" via secure chat  Pt coninues to be asymptomatic, running insulin drip , no current transition orders to end drip.   Pt with uneventful night overall. VSS. Remains on room air. Denies pain. Adequate UOP.

## 2021-03-10 NOTE — Progress Notes (Signed)
Inpatient Diabetes Program Recommendations  AACE/ADA: New Consensus Statement on Inpatient Glycemic Control (2015)  Target Ranges:  Prepandial:   less than 140 mg/dL      Peak postprandial:   less than 180 mg/dL (1-2 hours)      Critically ill patients:  140 - 180 mg/dL   Lab Results  Component Value Date   GLUCAP 143 (H) 03/10/2021   HGBA1C 13.7 (A) 03/08/2021     Diabetes history: DM 2 C-peptide on 12/06/20 low at 0.6 does not make enough insulin may also be transitioning into type 1 Outpatient Diabetes medications: Basaglar 13 units qhs, Humalog 9 units tid Current orders for Inpatient glycemic control:  Lantus 10 units Novolog 0-9 units tid + hs  A1c 11.7 on 4/5 A1c 13.7 on 7/7  Inpatient Diabetes Program Recommendations:    Watch with transition orders Pt newly diagnosed earlier this year. DM coordinator spoke with pt and family at that time (4/6) see notes if needed. Based on A1c level. I am not sure if pt is compliant with medications as reported.   Thanks,  Christena Deem RN, MSN, BC-ADM Inpatient Diabetes Coordinator Team Pager [phone] (8a-5p)

## 2021-03-10 NOTE — Plan of Care (Signed)
Continuing with plan of care. 

## 2021-03-10 NOTE — Progress Notes (Signed)
Patient ID: Joe Kelly, male   DOB: November 25, 1990, 30 y.o.   MRN: [id number] [facility] PROGRESS NOTE  Kiernan Atkerson Arciniega OZD:[id number] DOB: [birth year] DOA: 03/09/2021 PCP: Support, [facility]  HPI/Subjective: Patient came in with trouble breathing and high sugars and found to be in DKA.  Today he feels weak.  Potassium very low this morning.  Objective: Vitals:   03/10/21 1200 03/10/21 1300  BP: 114/70 120/74  Pulse: (!) 101 93  Resp: (!) 21 13  Temp: 98.4 F (36.9 C)   SpO2: 99% 100%    Intake/Output Summary (Last 24 hours) at 03/10/2021 1322 Last data filed at 03/10/2021 0002 Gross per 24 hour  Intake 5053.41 ml  Output 1650 ml  Net 3403.41 ml   Filed Weights   03/09/21 1726  Weight: 47 kg    ROS: Review of Systems  Respiratory:  Negative for shortness of breath.   Cardiovascular:  Negative for chest pain.  Gastrointestinal:  Negative for abdominal pain, nausea and vomiting.  Exam: Physical Exam HENT:     Head: Normocephalic.     Mouth/Throat:     Pharynx: No oropharyngeal exudate.  Eyes:     General: Lids are normal.     Conjunctiva/sclera: Conjunctivae normal.     Pupils: Pupils are equal, round, and reactive to light.  Cardiovascular:     Rate and Rhythm: Normal rate and regular rhythm.     Heart sounds: Normal heart sounds, S1 normal and S2 normal.  Pulmonary:     Breath sounds: No decreased breath sounds, wheezing, rhonchi or rales.  Abdominal:     Palpations: Abdomen is soft.     Tenderness: There is no abdominal tenderness.  Musculoskeletal:     Right ankle: No swelling.     Left ankle: No swelling.  Skin:    General: Skin is warm.     Findings: No rash.  Neurological:     Mental Status: He is alert and oriented to person, place, and time.     Data Reviewed: Basic Metabolic Panel: Recent Labs  Lab 03/09/21 2325 03/10/21 0105 03/10/21 0257 03/10/21 0528 03/10/21 1041  NA 131* 131* 131*  133* 132*  K 2.3* 2.1* 2.3* 2.3* 2.7*  CL 106 105 105 105 103  CO2 19* 19* 21* 23 23  GLUCOSE 216* 203* 195* 162* 136*  BUN 10 10 10 10 9   CREATININE 0.52* 0.52* 0.49* 0.46* 0.42*  CALCIUM 7.8* 7.8* 7.9* 8.0* 7.9*  MG  --   --   --   --  1.5*   Liver Function Tests: Recent Labs  Lab 03/09/21 1412  AST 14*  ALT 15  ALKPHOS 99  BILITOT 1.7*  PROT 7.4  ALBUMIN 4.3   CBC: Recent Labs  Lab 03/09/21 1412  WBC 14.6*  NEUTROABS 9.0*  HGB 16.4  HCT 50.9  MCV 95.1  PLT 373    CBG: Recent Labs  Lab 03/10/21 0406 03/10/21 0637 03/10/21 0704 03/10/21 0900 03/10/21 1054  GLUCAP 183* 148* 132* 143* 125*    Recent Results (from the past 240 hour(s))  Resp Panel by RT-PCR (Flu A&B, Covid) Nasopharyngeal Swab     Status: None   Collection Time: 03/09/21  2:12 PM   Specimen: Nasopharyngeal Swab; Nasopharyngeal(NP) swabs in vial transport medium  Result Value Ref Range Status   SARS Coronavirus 2 by RT PCR NEGATIVE NEGATIVE Final    Comment: (NOTE) SARS-CoV-2 target nucleic acids are NOT DETECTED.  The SARS-CoV-2  RNA is generally detectable in upper respiratory specimens during the acute phase of infection. The lowest concentration of SARS-CoV-2 viral copies this assay can detect is 138 copies/mL. A negative result does not preclude SARS-Cov-2 infection and should not be used as the sole basis for treatment or other patient management decisions. A negative result may occur with  improper specimen collection/handling, submission of specimen other than nasopharyngeal swab, presence of viral mutation(s) within the areas targeted by this assay, and inadequate number of viral copies(<138 copies/mL). A negative result must be combined with clinical observations, patient history, and epidemiological information. The expected result is Negative.  Fact Sheet for Patients:  BloggerCourse.com  Fact Sheet for Healthcare Providers:   SeriousBroker.it  This test is no t yet approved or cleared by the [place] FDA and  has been authorized for detection and/or diagnosis of SARS-CoV-2 by FDA under an Emergency Use Authorization (EUA). This EUA will remain  in effect (meaning this test can be used) for the duration of the COVID-19 declaration under Section 564(b)(1) of the Act, 21 U.S.C.section 360bbb-3(b)(1), unless the authorization is terminated  or revoked sooner.       Influenza A by PCR NEGATIVE NEGATIVE Final   Influenza B by PCR NEGATIVE NEGATIVE Final    Comment: (NOTE) The Xpert Xpress SARS-CoV-2/FLU/RSV plus assay is intended as an aid in the diagnosis of influenza from Nasopharyngeal swab specimens and should not be used as a sole basis for treatment. Nasal washings and aspirates are unacceptable for Xpert Xpress SARS-CoV-2/FLU/RSV testing.  Fact Sheet for Patients: BloggerCourse.com  Fact Sheet for Healthcare Providers: SeriousBroker.it  This test is not yet approved or cleared by the [place] FDA and has been authorized for detection and/or diagnosis of SARS-CoV-2 by FDA under an Emergency Use Authorization (EUA). This EUA will remain in effect (meaning this test can be used) for the duration of the COVID-19 declaration under Section 564(b)(1) of the Act, 21 U.S.C. section 360bbb-3(b)(1), unless the authorization is terminated or revoked.  Performed at [facility], [address] [place], [place] [address]      Studies: DG Chest Portable 1 View  Result Date: 03/09/2021 CLINICAL DATA:  Shortness of breath. EXAM: PORTABLE CHEST 1 VIEW COMPARISON:  December 05, 2020. FINDINGS: The heart size and mediastinal contours are within normal limits. Both lungs are clear. The visualized skeletal structures are unremarkable. IMPRESSION: No active disease. Electronically Signed   By: Lupita Raider M.D.   On:  03/09/2021 14:53    Scheduled Meds:  atorvastatin  80 mg Oral Daily   enoxaparin (LOVENOX) injection  40 mg Subcutaneous Q24H   insulin aspart  0-5 Units Subcutaneous QHS   insulin aspart  0-9 Units Subcutaneous TID WC   insulin aspart  5 Units Subcutaneous TID WC   insulin glargine  10 Units Subcutaneous Q24H   omega-3 acid ethyl esters  2 g Oral BID   potassium chloride  40 mEq Oral TID   Continuous Infusions:  lactated ringers Stopped (03/09/21 1700)   magnesium sulfate bolus IVPB 4 g (03/10/21 1233)    Assessment/Plan:  Diabetic ketoacidosis initially thought to be type 2 diabetes mellitus but may end up being type I.  Switched off insulin drip this morning and is on Lantus insulin 10 units.  We will add short acting insulin prior to meals plus sliding scale. Severe hypomagnesemia we will give 4 g of IV magnesium Severe hypokalemia will replace potassium IV and orally Hyperlipidemia unspecified continue atorvastatin  and omega-3 Leukocytosis likely secondary to DKA. Ambulate today     Code Status:     Code Status Orders  (From admission, onward)           Start     Ordered   03/09/21 1521  Full code  Continuous        03/09/21 1523           Code Status History     Date Active Date Inactive Code Status Order ID Comments User Context   12/05/2020 2118 12/07/2020 2238 Full Code [id number]  Rometta Emery, MD ED      Family Communication: Spoke with family at the bedside Disposition Plan: Status is: Inpatient  Dispo: The patient is from: Home              Anticipated d/c is to: Likely home tomorrow              Patient currently electrolytes to abnormal in order to go home today.   Difficult to place patient.  No.  Time spent: 28 minutes  Yuka Lall[employer]

## 2021-03-11 ENCOUNTER — Inpatient Hospital Stay: Payer: Self-pay

## 2021-03-11 DIAGNOSIS — E1110 Type 2 diabetes mellitus with ketoacidosis without coma: Principal | ICD-10-CM

## 2021-03-11 DIAGNOSIS — E871 Hypo-osmolality and hyponatremia: Secondary | ICD-10-CM

## 2021-03-11 DIAGNOSIS — R0602 Shortness of breath: Secondary | ICD-10-CM

## 2021-03-11 LAB — BASIC METABOLIC PANEL
Anion gap: 7 (ref 5–15)
Anion gap: 7 (ref 5–15)
BUN: 6 mg/dL (ref 6–20)
BUN: 7 mg/dL (ref 6–20)
CO2: 26 mmol/L (ref 22–32)
CO2: 28 mmol/L (ref 22–32)
Calcium: 8.2 mg/dL — ABNORMAL LOW (ref 8.9–10.3)
Calcium: 8.6 mg/dL — ABNORMAL LOW (ref 8.9–10.3)
Chloride: 101 mmol/L (ref 98–111)
Chloride: 95 mmol/L — ABNORMAL LOW (ref 98–111)
Creatinine, Ser: 0.37 mg/dL — ABNORMAL LOW (ref 0.61–1.24)
Creatinine, Ser: 0.5 mg/dL — ABNORMAL LOW (ref 0.61–1.24)
GFR, Estimated: >60 mL/min (ref >60)
GFR, Estimated: >60 mL/min (ref >60)
Glucose, Bld: 158 mg/dL — ABNORMAL HIGH (ref 70–99)
Glucose, Bld: 185 mg/dL — ABNORMAL HIGH (ref 70–99)
Potassium: 2.7 mmol/L — CL (ref 3.5–5.1)
Potassium: 3.1 mmol/L — ABNORMAL LOW (ref 3.5–5.1)
Sodium: 134 mmol/L — ABNORMAL LOW (ref 135–145)
Sodium: 130 mmol/L — ABNORMAL LOW (ref 135–145)

## 2021-03-11 LAB — MAGNESIUM: Magnesium: 2.2 mg/dL (ref 1.7–2.4)

## 2021-03-11 LAB — GLUCOSE, CAPILLARY
Glucose-Capillary: 190 mg/dL — ABNORMAL HIGH (ref 70–99)
Glucose-Capillary: 180 mg/dL — ABNORMAL HIGH (ref 70–99)
Glucose-Capillary: 139 mg/dL — ABNORMAL HIGH (ref 70–99)

## 2021-03-11 LAB — PHOSPHORUS: Phosphorus: 2.5 mg/dL (ref 2.5–4.6)

## 2021-03-11 MED ORDER — POTASSIUM CHLORIDE CRYS ER 20 MEQ PO TBCR
40.0000 meq | EXTENDED_RELEASE_TABLET | Freq: Three times a day (TID) | ORAL | Status: DC
  Administered 2021-03-11: 40 meq via ORAL
  Filled 2021-03-11: qty 2

## 2021-03-11 MED ORDER — INSULIN PEN NEEDLE 34G X 3.5 MM MISC
1.0000 | Freq: Three times a day (TID) | 0 refills | Status: DC
Start: 2021-03-11 — End: 2021-03-14

## 2021-03-11 MED ORDER — POTASSIUM CHLORIDE CRYS ER 20 MEQ PO TBCR
40.0000 meq | EXTENDED_RELEASE_TABLET | Freq: Once | ORAL | Status: AC
  Administered 2021-03-11: 40 meq via ORAL
  Filled 2021-03-11: qty 2

## 2021-03-11 MED ORDER — POTASSIUM CHLORIDE CRYS ER 20 MEQ PO TBCR: 20.0000 meq | EXTENDED_RELEASE_TABLET | Freq: Every day | ORAL | 0 refills | Status: DC

## 2021-03-11 MED ORDER — POTASSIUM CHLORIDE 10 MEQ/100ML IV SOLN
10.0000 meq | INTRAVENOUS | Status: AC
  Administered 2021-03-11 (×4): 10 meq via INTRAVENOUS
  Filled 2021-03-11: qty 100

## 2021-03-11 MED ORDER — IOHEXOL 350 MG/ML SOLN
75.0000 mL | Freq: Once | INTRAVENOUS | Status: AC | PRN
  Administered 2021-03-11: 75 mL via INTRAVENOUS

## 2021-03-11 MED ORDER — INSULIN LISPRO (1 UNIT DIAL) 100 UNIT/ML (KWIKPEN): 7.0000 [IU] | PEN_INJECTOR | Freq: Three times a day (TID) | SUBCUTANEOUS | 0 refills | Status: DC

## 2021-03-11 MED ORDER — ALBUTEROL SULFATE HFA 108 (90 BASE) MCG/ACT IN AERS: 2.0000 | INHALATION_SPRAY | Freq: Four times a day (QID) | RESPIRATORY_TRACT | 0 refills | Status: AC | PRN

## 2021-03-11 MED ORDER — BASAGLAR KWIKPEN 100 UNIT/ML ~~LOC~~ SOPN: 13.0000 [IU] | PEN_INJECTOR | Freq: Every day | SUBCUTANEOUS | 0 refills | Status: DC

## 2021-03-11 NOTE — Progress Notes (Signed)
Notified Dr. Para March of patient's potassium level of 2.7.  Awaiting orders.  Will continue to monitor.  Joe Kelly 03/11/2021  6:10 AM

## 2021-03-11 NOTE — Plan of Care (Signed)
Discharge teaching completed with patient who is in stable condition. 

## 2021-03-11 NOTE — Progress Notes (Signed)
Inpatient Diabetes Program Recommendations  AACE/ADA: New Consensus Statement on Inpatient Glycemic Control (2015)  Target Ranges:  Prepandial:   less than 140 mg/dL      Peak postprandial:   less than 180 mg/dL (1-2 hours)      Critically ill patients:  140 - 180 mg/dL   Lab Results  Component Value Date   GLUCAP 190 (H) 03/11/2021   HGBA1C 13.7 (A) 03/08/2021   Results for [facility] HAMILTON, MARINELLO (MRN [id number]) as of 03/11/2021 09:12  Ref. Range 03/10/2021 09:00 03/10/2021 10:54 03/10/2021 16:36 03/10/2021 20:45 03/11/2021 07:55  Glucose-Capillary Latest Ref Range: 70 - 99 mg/dL 826 (H) 415 (H) 97 830 (H) 190 (H)    Diabetes history: DM 2 C-peptide on 12/06/20 low at 0.6 does not make enough insulin may also be transitioning into type 1 Outpatient Diabetes medications: Basaglar 13 units qhs, Humalog 9 units tid Current orders for Inpatient glycemic control:  Lantus 10 units Novolog 0-9 units tid + hs Novolog 5 units tid  A1c 11.7 on 4/5 A1c 13.7 on 7/7  Inpatient Diabetes Program Recommendations:    MD - Note pt did not get meal coverage when glucose was 97, glucose increased to 287.  RN - please give insulin based on meal coverage parameters. If glucose is above 809 give meal coverage if pt eats at least 50% of meal.  Thanks,  Christena Deem RN, MSN, BC-ADM Inpatient Diabetes Coordinator Team Pager [phone] (8a-5p)

## 2021-03-11 NOTE — Discharge Summary (Signed)
[place] [facility] [place] NAME: Joe Kelly    MR#:  [id number]  DATE OF BIRTH:  19-Oct-1990  DATE OF ADMISSION:  03/09/2021 ADMITTING PHYSICIAN: Christel Mormon, MD  DATE OF DISCHARGE: 03/11/2021  PRIMARY CARE PHYSICIAN: [facility]   ADMISSION DIAGNOSIS:  DKA (diabetic ketoacidosis) ([place]) [E11.10] Diabetic ketoacidosis without coma associated with type 2 diabetes mellitus ([place]) [E11.10]  DISCHARGE DIAGNOSIS:  Active Problems:   DKA (diabetic ketoacidosis) ([place])   Hypomagnesemia   Hyperlipidemia   Leukocytosis   SECONDARY DIAGNOSIS:   Past Medical History:  Diagnosis Date   Diabetes mellitus without complication ([place])    Hyperlipidemia     HOSPITAL COURSE:   Diabetic ketoacidosis.  Initially thought to be type 2 diabetes but may end up being type I.  Patient was switched off insulin drip and is on Lantus insulin.  The patient takes Basaglar insulin at home and short acting insulin prior to meals.  I prescribed both Basaglar insulin and short acting insulin into [place] along with pen needles.  Compliance with medications is needed in order to prevent diabetic ketoacidosis.  He ran out of his insulin. Hypomagnesemia.  Patient read received IV magnesium during the hospital course and replaced now into the normal range Hypokalemia.  We replaced potassium IV and orally.  Since potassium is 3.1 upon discharge I will give potassium for another 5 days upon disposition.  Recommend rechecking a BMP and follow-up appointment. Hyperlipidemia unspecified.  Continue atorvastatin and omega-3 fatty acids Leukocytosis secondary to DKA Shortness of breath.  CT scan of the chest negative for pulmonary embolism or lung pathology.  We will give as needed albuterol inhaler. Hyponatremia secondary to diabetic ketoacidosis  DISCHARGE CONDITIONS:   Satisfactory  CONSULTS OBTAINED:   None  DRUG ALLERGIES:   Allergies  Allergen  Reactions   Coconut Flavor     Dizziness     DISCHARGE MEDICATIONS:   Allergies as of 03/11/2021       Reactions   Coconut Flavor    Dizziness         Medication List     TAKE these medications    albuterol 108 (90 Base) MCG/ACT inhaler Commonly known as: VENTOLIN HFA Inhale 2 puffs into the lungs every 6 (six) hours as needed for wheezing or shortness of breath.   atorvastatin 80 MG tablet Commonly known as: LIPITOR Tome 1 tableta (80 mg en total) por va oral diariamente. (Take 1 tablet (80 mg total) by mouth daily.)   Basaglar KwikPen 100 UNIT/ML Inject 13 Units into the skin daily. What changed: when to take this   blood glucose meter kit and supplies Kit Dispense based on patient and insurance preference. Use up to four times daily as directed to monitor blood glucose   insulin lispro 100 UNIT/ML KwikPen Commonly known as: HumaLOG KwikPen Inject 7 Units into the skin 3 (three) times daily before meals. What changed: how much to take   Insulin Pen Needle 34G X 3.5 MM Misc 1 Dose by Does not apply route 3 (three) times daily before meals.   omega-3 acid ethyl esters 1 g capsule Commonly known as: LOVAZA Tomar 2 cpsulas (2 g en total) por va oral 2 (dos) veces al da. (Take 2 capsules (2 g total) by mouth 2 (two) times daily.)   potassium chloride SA 20 MEQ tablet Commonly known as: KLOR-CON Take 1 tablet (20 mEq total) by mouth daily for 5 days. Start taking  on: March 12, 2021         DISCHARGE INSTRUCTIONS:  Follow-up PMD 1 week  If you experience worsening of your admission symptoms, develop shortness of breath, life threatening emergency, suicidal or homicidal thoughts you must seek medical attention immediately by calling 911 or calling your MD immediately  if symptoms less severe.  You Must read complete instructions/literature along with all the possible adverse reactions/side effects for all the Medicines you take and that have been  prescribed to you. Take any new Medicines after you have completely understood and accept all the possible adverse reactions/side effects.   Please note  You were cared for by a hospitalist during your hospital stay. If you have any questions about your discharge medications or the care you received while you were in the hospital after you are discharged, you can call the unit and asked to speak with the hospitalist on call if the hospitalist that took care of you is not available. Once you are discharged, your primary care physician will handle any further medical issues. Please note that NO REFILLS for any discharge medications will be authorized once you are discharged, as it is imperative that you return to your primary care physician (or establish a relationship with a primary care physician if you do not have one) for your aftercare needs so that they can reassess your need for medications and monitor your lab values.    Today   CHIEF COMPLAINT:   Chief Complaint  Patient presents with   Shortness of Breath    HISTORY OF PRESENT ILLNESS:  Joe Kelly  is a 30 y.o. male coming in with shortness of breath and found to be in diabetic ketoacidosis   VITAL SIGNS:  Blood pressure 102/66, pulse 82, temperature 98 F (36.7 C), temperature source Oral, resp. rate 16, height 5' 2.01" (1.575 m), weight 47 kg, SpO2 100 %.  I/O:   Intake/Output Summary (Last 24 hours) at 03/11/2021 1522 Last data filed at 03/11/2021 1402 Gross per 24 hour  Intake 477.54 ml  Output --  Net 477.54 ml    PHYSICAL EXAMINATION:  GENERAL:  30 y.o.-year-old patient lying in the bed with no acute distress.  EYES: Pupils equal, round, reactive to light and accommodation. No scleral icterus.  HEENT: Head atraumatic, normocephalic. Oropharynx and nasopharynx clear.   LUNGS: Normal breath sounds bilaterally, no wheezing, rales,rhonchi or crepitation. No use of accessory muscles of respiration.   CARDIOVASCULAR: S1, S2 normal. No murmurs, rubs, or gallops.  ABDOMEN: Soft, non-tender, non-distended.  EXTREMITIES: No pedal edema.  NEUROLOGIC: Cranial nerves II through XII are intact. Muscle strength 5/5 in all extremities. Sensation intact. Gait not checked.  PSYCHIATRIC: The patient is alert and oriented x 3.  SKIN: No obvious rash, lesion, or ulcer.   DATA REVIEW:   CBC Recent Labs  Lab 03/09/21 1412  WBC 14.6*  HGB 16.4  HCT 50.9  PLT 373    Chemistries  Recent Labs  Lab 03/09/21 1412 03/09/21 1643 03/11/21 0451 03/11/21 1430  NA 126*   < > 134* 130*  K 3.9   < > 2.7* 3.1*  CL 100   < > 101 95*  CO2 <7*   < > 26 28  GLUCOSE 398*   < > 158* 185*  BUN 16   < > 6 7  CREATININE 1.03   < > 0.37* 0.50*  CALCIUM 8.8*   < > 8.2* 8.6*  MG  --    < >  2.2  --   AST 14*  --   --   --   ALT 15  --   --   --   ALKPHOS 99  --   --   --   BILITOT 1.7*  --   --   --    < > = values in this interval not displayed.     Microbiology Results  Results for orders placed or performed during the hospital encounter of 03/09/21  Resp Panel by RT-PCR (Flu A&B, Covid) Nasopharyngeal Swab     Status: None   Collection Time: 03/09/21  2:12 PM   Specimen: Nasopharyngeal Swab; Nasopharyngeal(NP) swabs in vial transport medium  Result Value Ref Range Status   SARS Coronavirus 2 by RT PCR NEGATIVE NEGATIVE Final    Comment: (NOTE) SARS-CoV-2 target nucleic acids are NOT DETECTED.  The SARS-CoV-2 RNA is generally detectable in upper respiratory specimens during the acute phase of infection. The lowest concentration of SARS-CoV-2 viral copies this assay can detect is 138 copies/mL. A negative result does not preclude SARS-Cov-2 infection and should not be used as the sole basis for treatment or other patient management decisions. A negative result may occur with  improper specimen collection/handling, submission of specimen other than nasopharyngeal swab, presence of viral  mutation(s) within the areas targeted by this assay, and inadequate number of viral copies(<138 copies/mL). A negative result must be combined with clinical observations, patient history, and epidemiological information. The expected result is Negative.  Fact Sheet for Patients:  EntrepreneurPulse.com.au  Fact Sheet for Healthcare Providers:  IncredibleEmployment.be  This test is no t yet approved or cleared by the [place] FDA and  has been authorized for detection and/or diagnosis of SARS-CoV-2 by FDA under an Emergency Use Authorization (EUA). This EUA will remain  in effect (meaning this test can be used) for the duration of the COVID-19 declaration under Section 564(b)(1) of the Act, 21 U.S.C.section 360bbb-3(b)(1), unless the authorization is terminated  or revoked sooner.       Influenza A by PCR NEGATIVE NEGATIVE Final   Influenza B by PCR NEGATIVE NEGATIVE Final    Comment: (NOTE) The Xpert Xpress SARS-CoV-2/FLU/RSV plus assay is intended as an aid in the diagnosis of influenza from Nasopharyngeal swab specimens and should not be used as a sole basis for treatment. Nasal washings and aspirates are unacceptable for Xpert Xpress SARS-CoV-2/FLU/RSV testing.  Fact Sheet for Patients: EntrepreneurPulse.com.au  Fact Sheet for Healthcare Providers: IncredibleEmployment.be  This test is not yet approved or cleared by the [place] FDA and has been authorized for detection and/or diagnosis of SARS-CoV-2 by FDA under an Emergency Use Authorization (EUA). This EUA will remain in effect (meaning this test can be used) for the duration of the COVID-19 declaration under Section 564(b)(1) of the Act, 21 U.S.C. section 360bbb-3(b)(1), unless the authorization is terminated or revoked.  Performed at [facility], [address] [place], [place] [address]     RADIOLOGY:  CT Angio Chest  Pulmonary Embolism (PE) W or WO Contrast  Result Date: 03/11/2021 CLINICAL DATA:  Chest pain and shortness of breath. Nausea and vomiting. EXAM: CT ANGIOGRAPHY CHEST WITH CONTRAST TECHNIQUE: Multidetector CT imaging of the chest was performed using the standard protocol during bolus administration of intravenous contrast. Multiplanar CT image reconstructions and MIPs were obtained to evaluate the vascular anatomy. CONTRAST:  72m OMNIPAQUE IOHEXOL 350 MG/ML SOLN COMPARISON:  None. FINDINGS: Cardiovascular: Heart is normal size. Thoracic aorta is normal in caliber.  Pulmonary arterial system is well opacified without evidence of emboli. Remaining vascular structures are unremarkable. Mediastinum/Nodes: No mediastinal or hilar adenopathy. Remaining mediastinal structures are unremarkable. Lungs/Pleura: Lungs are adequately inflated and otherwise clear. Airways are normal. Upper Abdomen: No acute findings. Musculoskeletal: No focal abnormality. Review of the MIP images confirms the above findings. IMPRESSION: No acute cardiopulmonary disease and no evidence of pulmonary embolism. Electronically Signed   By: Marin Olp M.D.   On: 03/11/2021 12:36     Management plans discussed with the patient, family and they are in agreement.  CODE STATUS:     Code Status Orders  (From admission, onward)           Start     Ordered   03/09/21 1521  Full code  Continuous        03/09/21 1523           Code Status History     Date Active Date Inactive Code Status Order ID Comments User Context   12/05/2020 2118 12/07/2020 2238 Full Code [id number]  Elwyn Reach, MD ED       TOTAL TIME TAKING CARE OF THIS PATIENT: 35 minutes.    Loletha Grayer M.D on 03/11/2021 at 3:22 PM  Between 7am to 6pm - Pager - [phone]  After 6pm go to [email or web address] - password [email or web address]  [facility]  CC: Primary care physician; [facility]

## 2021-03-11 NOTE — Progress Notes (Signed)
PHARMACY CONSULT NOTE  Pharmacy Consult for Electrolyte Monitoring and Replacement   Recent Labs: Potassium (mmol/L)  Date Value  03/11/2021 2.7 (LL)   Magnesium (mg/dL)  Date Value  84/69/6295 2.2   Calcium (mg/dL)  Date Value  28/41/3244 8.2 (L)   Albumin (g/dL)  Date Value  09/04/7251 4.3  01/17/2021 4.2   Phosphorus (mg/dL)  Date Value  66/44/0347 2.5   Sodium (mmol/L)  Date Value  03/11/2021 134 (L)  02/07/2021 132 (L)    Assessment: 30 y.o. Hispanic male with medical history significant for  diabetes mellitus and dyslipidemia, presented to the emergency room with acute onset of intractable nausea and vomiting. Pharmacy is asked to follow and replace electrolytes.  Goal of Therapy:  Electrolytes WNL  Plan:  10 mEq IV KCl x 4 40 mEq Oral KCl TID x 3 Recheck potassium at 1400  Lowella Bandy ,PharmD Clinical Pharmacist 03/11/2021 6:25 AM

## 2021-03-11 NOTE — Plan of Care (Signed)
Continuing with plan of care. 

## 2021-03-12 ENCOUNTER — Other Ambulatory Visit: Payer: Self-pay

## 2021-03-13 ENCOUNTER — Other Ambulatory Visit: Payer: Self-pay

## 2021-03-13 MED ORDER — BASAGLAR KWIKPEN 100 UNIT/ML ~~LOC~~ SOPN
PEN_INJECTOR | SUBCUTANEOUS | 0 refills | Status: DC
  Filled 2021-03-13: qty 15, 115d supply, fill #0

## 2021-03-13 MED ORDER — INSULIN LISPRO (1 UNIT DIAL) 100 UNIT/ML (KWIKPEN)
PEN_INJECTOR | SUBCUTANEOUS | 0 refills | Status: DC
  Filled 2021-03-13: qty 15, 72d supply, fill #0

## 2021-03-13 MED ORDER — INSULIN PEN NEEDLE 32G X 4 MM MISC
0 refills | Status: DC
  Filled 2021-03-13: qty 100, 25d supply, fill #0

## 2021-03-15 ENCOUNTER — Other Ambulatory Visit: Payer: Self-pay

## 2021-03-15 ENCOUNTER — Ambulatory Visit: Payer: Self-pay | Admitting: Gerontology

## 2021-03-15 VITALS — BP 97/61 | HR 93 | Temp 99.0°F | Resp 18 | Ht 62.0 in | Wt 108.6 lb

## 2021-03-15 DIAGNOSIS — E1165 Type 2 diabetes mellitus with hyperglycemia: Secondary | ICD-10-CM

## 2021-03-15 DIAGNOSIS — E781 Pure hyperglyceridemia: Secondary | ICD-10-CM

## 2021-03-15 DIAGNOSIS — Z794 Long term (current) use of insulin: Secondary | ICD-10-CM

## 2021-03-15 LAB — GLUCOSE, POCT (MANUAL RESULT ENTRY): POC Glucose: 496 mg/dl — AB (ref 70–99)

## 2021-03-15 MED ORDER — BASAGLAR KWIKPEN 100 UNIT/ML ~~LOC~~ SOPN
PEN_INJECTOR | SUBCUTANEOUS | 5 refills | Status: DC
  Filled 2021-03-15: qty 15, fill #0

## 2021-03-15 MED ORDER — ATORVASTATIN CALCIUM 80 MG PO TABS
80.0000 mg | ORAL_TABLET | Freq: Every day | ORAL | 3 refills | Status: DC
  Filled 2021-03-15: qty 30, 30d supply, fill #0

## 2021-03-15 MED ORDER — INSULIN LISPRO (1 UNIT DIAL) 100 UNIT/ML (KWIKPEN)
PEN_INJECTOR | SUBCUTANEOUS | 5 refills | Status: DC
  Filled 2021-03-15: qty 15, fill #0

## 2021-03-15 MED ORDER — INSULIN PEN NEEDLE 32G X 4 MM MISC
2 refills | Status: DC
  Filled 2021-03-15: qty 200, fill #0
  Filled 2021-10-15: qty 100, 25d supply, fill #0

## 2021-03-15 NOTE — Progress Notes (Signed)
Established Patient Office Visit  Subjective:  Patient ID: Joe Kelly, male    DOB: 07/04/1991  Age: 30 y.o. MRN: [id number]  CC:  Chief Complaint  Patient presents with   Follow-up    Pt ran out of insulin, seen at [facility] ED 03/09/21 for DKA, pt has insulin now.   Diabetes    HPI Joe Kelly is a 30 y/o male who has history of T2DM presents for follow up of uncontrolled T2DM. He was discharged from [facility] on 03/11/21 after he was treated for DKA and Hypokalemia. Ms Joe Kelly [id number] with Haivana Nakya, interpreted during the visit. His HgbA1c done on 03/08/21 increased from 11.7% to 13.7% He states that he checks his blood glucose tid and his fasting reading today was 194 mg/dl, though he didn't bring his log. His blood glucose checked during visit was 496 mg/dl, he states that he ate around 11 am and self administered 13 units of Basaglar and 7 units of Lispro at 10:30 am. He denies hypo glycemic symptoms, polyuria, polyphagia and polydipsia. Overall, he states that he's doing well and offers no further complaint.   Past Medical History:  Diagnosis Date   Diabetes mellitus without complication ([place])    Hyperlipidemia     Past Surgical History:  Procedure Laterality Date   NO PAST SURGERIES      Family History  Problem Relation Age of Onset   Diabetes Mother    Healthy Father     Social History   Socioeconomic History   Marital status: Single    Spouse name: Not on file   Number of children: Not on file   Years of education: Not on file   Highest education level: Not on file  Occupational History   Not on file  Tobacco Use   Smoking status: Former    Types: Cigarettes   Smokeless tobacco: Never   Tobacco comments:    1 cigarette 3 times per week  Vaping Use   Vaping Use: Never used  Substance and Sexual Activity   Alcohol use: Not Currently   Drug use: Not Currently   Sexual activity: Not on file  Other Topics Concern   Not on file   Social History Narrative   Not on file   Social Determinants of Health   Financial Resource Strain: Not on file  Food Insecurity: No Food Insecurity   Worried About Running Out of Food in the Last Year: Never true   [place] in the Last Year: Never true  Transportation Needs: No Transportation Needs   Lack of Transportation (Medical): No   Lack of Transportation (Non-Medical): No  Physical Activity: Not on file  Stress: Not on file  Social Connections: Not on file  Intimate Partner Violence: Not on file    Outpatient Medications Prior to Visit  Medication Sig Dispense Refill   albuterol (VENTOLIN HFA) 108 (90 Base) MCG/ACT inhaler Inhale 2 puffs into the lungs every 6 (six) hours as needed for wheezing or shortness of breath. 8 g 0   blood glucose meter kit and supplies KIT Dispense based on patient and insurance preference. Use up to four times daily as directed to monitor blood glucose 1 each 0   omega-3 acid ethyl esters (LOVAZA) 1 g capsule Take 2 capsules (2 g total) by mouth 2 (two) times daily. 120 capsule 0   potassium chloride SA (KLOR-CON) 20 MEQ tablet Take 1 tablet (20 mEq total) by mouth daily for 5 days.  5 tablet 0   atorvastatin (LIPITOR) 80 MG tablet Take 1 tablet (80 mg total) by mouth daily. 30 tablet 0   Insulin Glargine (BASAGLAR KWIKPEN) 100 UNIT/ML Inject 13 units under the skin once daily. 15 mL 0   insulin lispro (HUMALOG KWIKPEN) 100 UNIT/ML KwikPen Inject 7 units under the skin three times daily before meals. 15 mL 0   Insulin Pen Needle 32G X 4 MM MISC Use as directed with insulin pens. 200 each 0   Insulin Glargine (BASAGLAR KWIKPEN) 100 UNIT/ML Inject 13 Units into the skin daily. 15 mL 0   insulin lispro (HUMALOG KWIKPEN) 100 UNIT/ML KwikPen Inject 7 Units into the skin 3 (three) times daily before meals. 15 mL 0   Insulin Pen Needle 34G X 3.5 MM MISC 1 Dose by Does not apply route 3 (three) times daily before meals. 200 each 0   No  facility-administered medications prior to visit.    Allergies  Allergen Reactions   Coconut Flavor     Dizziness     ROS Review of Systems  Constitutional: Negative.   Eyes: Negative.   Respiratory: Negative.    Cardiovascular: Negative.   Endocrine: Negative.   Skin: Negative.   Neurological: Negative.      Objective:    Physical Exam HENT:     Head: Normocephalic and atraumatic.     Mouth/Throat:     Mouth: Mucous membranes are moist.  Eyes:     Extraocular Movements: Extraocular movements intact.     Conjunctiva/sclera: Conjunctivae normal.     Pupils: Pupils are equal, round, and reactive to light.  Cardiovascular:     Rate and Rhythm: Normal rate and regular rhythm.     Pulses: Normal pulses.     Heart sounds: Normal heart sounds.  Pulmonary:     Effort: Pulmonary effort is normal.     Breath sounds: Normal breath sounds.  Skin:    General: Skin is warm.  Neurological:     General: No focal deficit present.     Mental Status: He is alert and oriented to person, place, and time. Mental status is at baseline.  Psychiatric:        Mood and Affect: Mood normal.        Behavior: Behavior normal.        Thought Content: Thought content normal.        Judgment: Judgment normal.    BP 97/61 (BP Location: Left Arm, Patient Position: Sitting, Cuff Size: Normal)   Pulse 93   Temp 99 F (37.2 C)   Resp 18   Ht 5' 2"  (1.575 m)   Wt 108 lb 9.6 oz (49.3 kg)   SpO2 97%   BMI 19.86 kg/m  Wt Readings from Last 3 Encounters:  03/15/21 108 lb 9.6 oz (49.3 kg)  03/09/21 103 lb 9.9 oz (47 kg)  03/08/21 104 lb (47.2 kg)     Health Maintenance Due  Topic Date Due   PNEUMOCOCCAL POLYSACCHARIDE VACCINE AGE 56-64 HIGH RISK  Never done   COVID-19 Vaccine (1) Never done   Pneumococcal Vaccine 49-70 Years old (1 - PCV) Never done   FOOT EXAM  Never done   OPHTHALMOLOGY EXAM  Never done   Hepatitis C Screening  Never done   TETANUS/TDAP  Never done    There are no  preventive care reminders to display for this patient.  Lab Results  Component Value Date   TSH 1.934 12/05/2020   Lab Results  Component Value Date   WBC 14.6 (H) 03/09/2021   HGB 16.4 03/09/2021   HCT 50.9 03/09/2021   MCV 95.1 03/09/2021   PLT 373 03/09/2021   Lab Results  Component Value Date   NA 130 (L) 03/11/2021   K 3.1 (L) 03/11/2021   CO2 28 03/11/2021   GLUCOSE 185 (H) 03/11/2021   BUN 7 03/11/2021   CREATININE 0.50 (L) 03/11/2021   BILITOT 1.7 (H) 03/09/2021   ALKPHOS 99 03/09/2021   AST 14 (L) 03/09/2021   ALT 15 03/09/2021   PROT 7.4 03/09/2021   ALBUMIN 4.3 03/09/2021   CALCIUM 8.6 (L) 03/11/2021   ANIONGAP 7 03/11/2021   EGFR 144 02/07/2021   Lab Results  Component Value Date   CHOL 422 (H) 01/17/2021   Lab Results  Component Value Date   HDL 24 (L) 01/17/2021   Lab Results  Component Value Date   LDLCALC Comment (A) 01/17/2021   Lab Results  Component Value Date   TRIG 2,123 ([place]) 01/17/2021   Lab Results  Component Value Date   CHOLHDL 17.6 (H) 01/17/2021   Lab Results  Component Value Date   HGBA1C 13.7 (A) 03/08/2021      Assessment & Plan:    1. Type 2 diabetes mellitus with hyperglycemia, with long-term current use of insulin ([place]) -He will continue on current medication, was advised to check his blood glucose 3 times daily, record and bring the log to follow-up visit next week.  He was also advised that his fasting blood glucose readings should be between 80 to 130 mg per DL.  He was encouraged to continue on low-carb/no concentrated sweet diet. - POCT Glucose (CBG) - Insulin Glargine (BASAGLAR KWIKPEN) 100 UNIT/ML; Inject 13 units under the skin once daily.  Dispense: 15 mL; Refill: 5 - Insulin Pen Needle 32G X 4 MM MISC; Use as directed with insulin pens.  Dispense: 200 each; Refill: 2 - insulin lispro (HUMALOG KWIKPEN) 100 UNIT/ML KwikPen; Inject 7 units under the skin three times daily before meals.  Dispense: 15 mL; Refill:  5  2. High triglycerides -He will continue on current medication, low-fat/cholesterol diet. - atorvastatin (LIPITOR) 80 MG tablet; Take 1 tablet (80 mg total) by mouth daily.  Dispense: 30 tablet; Refill: 3     Follow-up: Return in about 1 week (around 03/22/2021), or if symptoms worsen or fail to improve.    Tenzin Pavon Jerold Coombe, NP

## 2021-03-15 NOTE — Progress Notes (Signed)
Pt triaged with the help of interpreter services. Byrd Hesselbach #[id number]

## 2021-03-15 NOTE — Patient Instructions (Signed)
https://www.diabeteseducator.org/docs/default-source/living-with-diabetes/conquering-the-grocery-store-v1.pdf?sfvrsn=4">  Recuento de carbohidratos para la diabetes mellitus en los adultos Carbohydrate Counting for Diabetes Mellitus, Adult El recuento de carbohidratos es un mtodo para llevar un registro de la cantidad de carbohidratos que se ingieren. La ingesta natural de carbohidratos aumenta la cantidad de azcar (glucosa) en la sangre. El recuento de la cantidad de carbohidratos que se ingierenmejora el control del nivel de [name], lo que ayuda a [name] la diabetes. Es importante saber la cantidad de carbohidratos que se pueden ingerir en cada comida sin correr [name], minerals. Esto es [name]. Un nutricionista puede ayudarlo a crear un plan de alimentacin y a calcular lacantidad de carbohidratos que debe ingerir en cada comida y colacin. Qu alimentos contienen carbohidratos? Los siguientes alimentos incluyen carbohidratos: Granos, como panes y cereales. Frijoles secos y productos con soja. Verduras con almidn, como papas, guisantes y maz. Joe Kelly y jugos de frutas. Leche y Dentist. Dulces y colaciones, como pasteles, galletas, caramelos, papas fritas de bolsa y refrescos. Cmo se calculan los carbohidratos de los alimentos? Hay dos maneras de calcular los carbohidratos de los alimentos. Puede leer las etiquetas de los alimentos o aprender cules son los tamaos de las porciones estndar de los alimentos. Puede usar cualquiera de 1 Joe Kelly o Joe Kelly. Usar la [name] La lista de informacin nutricional est incluida en las etiquetas de casi todas las bebidas y los alimentos envasados de los [name]. Incluye lo siguiente: El tamao de la porcin. Informacin sobre los nutrientes de cada porcin, incluidos los gramos (g) de carbohidratos por porcin. Para usar la informacin nutricional: Decida cuntas porciones va  a comer. Multiplique la cantidad de porciones por el nmero de carbohidratos por porcin. El resultado es la cantidad total de carbohidratos que comer. Conocer los tamaos de las porciones estndar de los alimentos Cuando coma alimentos que contengan carbohidratos y que no estn envasados o no incluyan la informacin nutricional en la etiqueta, debe medir las porciones para poder calcular la cantidad de carbohidratos. Mida los alimentos que comer con una balanza de alimentos o una taza medidora, si es necesario. Decida cuntas porciones de [name], systems. Multiplique el nmero de porciones por 15. En los alimentos que contienen carbohidratos, una porcin [name] a 15 g de carbohidratos. Por ejemplo, si come 2 tazas o 10 onzas (300 g) de fresas, habr comido 2 porciones y 30 g de carbohidratos (2 porciones x 15 g = 30 g). En el caso de las comidas que contienen mezclas de ms de un alimento, como las sopas y los guisos, debe calcular los carbohidratos de cada alimento que se incluye. La siguiente lista contiene los tamaos de porciones estndar de los alimentos ricos en carbohidratos ms comunes. Cada una de estas porciones tiene aproximadamente 15 g de carbohidratos: 1 rebanada de pan. 1 tortilla de seis pulgadas (15 cm). ? de taza o 2 onzas (53 g) de arroz o pasta cocidos.  taza o 3 onzas (85 g) de lentejas o frijoles cocidos o enlatados, escurridos y enjuagados.  taza o 3 onzas (85 g) de verduras con almidn, como guisantes, maz o zapallo.  taza o 4 onzas (120 g) de cereal caliente.  taza o 3 onzas (85 g) de papas hervidas o en pur, o  o 3 onzas (85 g) de una papa grande al horno.  taza o 4 onzas fluidas (118 ml) de jugo de frutas. 1 taza u 8 onzas fluidas (237 ml) de leche. 1 unidad pequea o 4  onzas (106 g) de manzana.  unidad o 2 onzas (63 g) de una banana mediana. 1 taza o 5 oz (150 g) de fresas. 3 tazas o 1 oz (24 g) de palomitas de maz. Cul sera un ejemplo de  recuento de carbohidratos? Para calcular el nmero de carbohidratos de este ejemplo de comida, siga lospasos que se describen a continuacin. Ejemplo de comida 3 onzas (85 g) de pechugas de pollo. ? de taza o 4 onzas (106 g) de arroz integral.  taza o 3 onzas (85 g) de maz. 1 taza u 8 onzas fluidas (237 ml) de leche. 1 taza o 5 onzas (150 g) de fresas con crema batida sin azcar. Clculo de carbohidratos Identifique los alimentos que contienen carbohidratos: Arroz. Maz. Leche. Joe Kelly. Calcule cuntas porciones come de cada alimento: 2 porciones de arroz. 1 porcin de maz. 1 porcin de leche. 1 porcin de fresas. Multiplique cada nmero de porciones por 15 g: 2 porciones de arroz x 15 g = 30 g. 1 porcin de maz x 15 g = 15 g. 1 porcin de leche x 15 g = 15 g. 1 porcin de fresas x 15 g = 15 g. Sume todas las cantidades para conocer el total de gramos de carbohidratos consumidos: 30 g + 15 g + 15 g + 15 g = 75 g de carbohidratos en total. Consejos para seguir este plan Al ir de compras Elabore un plan de comidas y luego haga una lista de compras. Compre verduras frescas y congeladas, frutas frescas y congeladas, productos lcteos, huevos, frijoles, lentejas y cereales integrales. Fjese en las etiquetas de los alimentos. Elija los alimentos que tengan ms fibra y Joe Kelly. Evite los alimentos procesados y los alimentos con [name], clinical. Planificacin de las comidas Trate de consumir la misma cantidad de carbohidratos en cada comida y en cada colacin. Planifique tomar comidas y colaciones regulares y equilibradas. Dnde buscar ms informacin [name]: www.diabetes.org [name]: [name] Resumen El recuento de carbohidratos es un mtodo para llevar un registro de la cantidad de carbohidratos que se  ingieren. La ingesta natural de carbohidratos aumenta la cantidad de azcar (glucosa) en la sangre. El recuento de la cantidad de carbohidratos que se ingieren mejora el control del nivel de [name], lo que ayuda a [name] la diabetes. Un nutricionista puede ayudarlo a crear un plan de alimentacin y a calcular la cantidad de carbohidratos que debe ingerir en cada comida y colacin. Esta informacin no tiene Theme park manager el consejo del mdico. Asegresede hacerle al mdico cualquier pregunta que tenga. Document Revised: 09/27/2019 Document Reviewed: 09/27/2019 Elsevier Patient Education  2021 [employer].

## 2021-03-22 ENCOUNTER — Other Ambulatory Visit: Payer: Self-pay

## 2021-03-22 ENCOUNTER — Encounter: Payer: Self-pay | Admitting: Gerontology

## 2021-03-22 ENCOUNTER — Ambulatory Visit: Payer: Self-pay | Admitting: Gerontology

## 2021-03-22 VITALS — BP 109/69 | HR 94 | Temp 99.2°F | Resp 18 | Ht 62.0 in | Wt 112.7 lb

## 2021-03-22 DIAGNOSIS — E1165 Type 2 diabetes mellitus with hyperglycemia: Secondary | ICD-10-CM

## 2021-03-22 DIAGNOSIS — Z794 Long term (current) use of insulin: Secondary | ICD-10-CM

## 2021-03-22 MED ORDER — BASAGLAR KWIKPEN 100 UNIT/ML ~~LOC~~ SOPN
16.0000 [IU] | PEN_INJECTOR | Freq: Every day | SUBCUTANEOUS | 5 refills | Status: DC
  Filled 2021-03-22: qty 15, 93d supply, fill #0

## 2021-03-22 MED ORDER — INSULIN LISPRO (1 UNIT DIAL) 100 UNIT/ML (KWIKPEN)
9.0000 [IU] | PEN_INJECTOR | Freq: Three times a day (TID) | SUBCUTANEOUS | 5 refills | Status: DC
  Filled 2021-03-22: qty 15, 56d supply, fill #0

## 2021-03-22 NOTE — Patient Instructions (Signed)
https://www.diabeteseducator.org/docs/default-source/living-with-diabetes/conquering-the-grocery-store-v1.pdf?sfvrsn=4">  Recuento de carbohidratos para la diabetes mellitus en los adultos Carbohydrate Counting for Diabetes Mellitus, Adult El recuento de carbohidratos es un mtodo para llevar un registro de la cantidad de carbohidratos que se ingieren. La ingesta natural de carbohidratos aumenta la cantidad de azcar (glucosa) en la sangre. El recuento de la cantidad de carbohidratos que se ingierenmejora el control del nivel de glucemia, lo que ayuda a manejar la diabetes. Es importante saber la cantidad de carbohidratos que se pueden ingerir en cada comida sin correr ningn riesgo. Esto es diferente en cada persona. Un nutricionista puede ayudarlo a crear un plan de alimentacin y a calcular lacantidad de carbohidratos que debe ingerir en cada comida y colacin. Qu alimentos contienen carbohidratos? Los siguientes alimentos incluyen carbohidratos: Granos, como panes y cereales. Frijoles secos y productos con soja. Verduras con almidn, como papas, guisantes y maz. Frutas y jugos de frutas. Leche y yogur. Dulces y colaciones, como pasteles, galletas, caramelos, papas fritas de bolsa y refrescos. Cmo se calculan los carbohidratos de los alimentos? Hay dos maneras de calcular los carbohidratos de los alimentos. Puede leer las etiquetas de los alimentos o aprender cules son los tamaos de las porciones estndar de los alimentos. Puede usar cualquiera de los dos mtodos o unacombinacin de ambos. Usar la etiqueta de informacin nutricional La lista de informacin nutricional est incluida en las etiquetas de casi todas las bebidas y los alimentos envasados de los [place]. Incluye lo siguiente: El tamao de la porcin. Informacin sobre los nutrientes de cada porcin, incluidos los gramos (g) de carbohidratos por porcin. Para usar la informacin nutricional: Decida cuntas porciones va  a comer. Multiplique la cantidad de porciones por el nmero de carbohidratos por porcin. El resultado es la cantidad total de carbohidratos que comer. Conocer los tamaos de las porciones estndar de los alimentos Cuando coma alimentos que contengan carbohidratos y que no estn envasados o no incluyan la informacin nutricional en la etiqueta, debe medir las porciones para poder calcular la cantidad de carbohidratos. Mida los alimentos que comer con una balanza de alimentos o una taza medidora, si es necesario. Decida cuntas porciones de tamao estndar comer. Multiplique el nmero de porciones por 15. En los alimentos que contienen carbohidratos, una porcin equivale a 15 g de carbohidratos. Por ejemplo, si come 2 tazas o 10 onzas (300 g) de fresas, habr comido 2 porciones y 30 g de carbohidratos (2 porciones x 15 g = 30 g). En el caso de las comidas que contienen mezclas de ms de un alimento, como las sopas y los guisos, debe calcular los carbohidratos de cada alimento que se incluye. La siguiente lista contiene los tamaos de porciones estndar de los alimentos ricos en carbohidratos ms comunes. Cada una de estas porciones tiene aproximadamente 15 g de carbohidratos: 1 rebanada de pan. 1 tortilla de seis pulgadas (15 cm). ? de taza o 2 onzas (53 g) de arroz o pasta cocidos.  taza o 3 onzas (85 g) de lentejas o frijoles cocidos o enlatados, escurridos y enjuagados.  taza o 3 onzas (85 g) de verduras con almidn, como guisantes, maz o zapallo.  taza o 4 onzas (120 g) de cereal caliente.  taza o 3 onzas (85 g) de papas hervidas o en pur, o  o 3 onzas (85 g) de una papa grande al horno.  taza o 4 onzas fluidas (118 ml) de jugo de frutas. 1 taza u 8 onzas fluidas (237 ml) de leche. 1 unidad pequea o 4   onzas (106 g) de manzana.  unidad o 2 onzas (63 g) de una banana mediana. 1 taza o 5 oz (150 g) de fresas. 3 tazas o 1 oz (24 g) de palomitas de maz. Cul sera un ejemplo de  recuento de carbohidratos? Para calcular el nmero de carbohidratos de este ejemplo de comida, siga lospasos que se describen a continuacin. Ejemplo de comida 3 onzas (85 g) de pechugas de pollo. ? de taza o 4 onzas (106 g) de arroz integral.  taza o 3 onzas (85 g) de maz. 1 taza u 8 onzas fluidas (237 ml) de leche. 1 taza o 5 onzas (150 g) de fresas con crema batida sin azcar. Clculo de carbohidratos Identifique los alimentos que contienen carbohidratos: Arroz. Maz. Leche. Jinny Sanders. Calcule cuntas porciones come de cada alimento: 2 porciones de arroz. 1 porcin de maz. 1 porcin de leche. 1 porcin de fresas. Multiplique cada nmero de porciones por 15 g: 2 porciones de arroz x 15 g = 30 g. 1 porcin de maz x 15 g = 15 g. 1 porcin de leche x 15 g = 15 g. 1 porcin de fresas x 15 g = 15 g. Sume todas las cantidades para conocer el total de gramos de carbohidratos consumidos: 30 g + 15 g + 15 g + 15 g = 75 g de carbohidratos en total. Consejos para seguir este plan Al ir de compras El[employer]ore un plan de comidas y luego haga una lista de compras. Compre verduras frescas y congeladas, frutas frescas y congeladas, productos lcteos, huevos, frijoles, lentejas y cereales integrales. Fjese en las etiquetas de los alimentos. Elija los alimentos que tengan ms fibra y Neurosurgeon. Evite los alimentos procesados y los alimentos con [name], clinical. Planificacin de las comidas Trate de consumir la misma cantidad de carbohidratos en cada comida y en cada colacin. Planifique tomar comidas y colaciones regulares y equilibradas. Dnde buscar ms informacin [name]: www.diabetes.org [name]: [name] Resumen El recuento de carbohidratos es un mtodo para llevar un registro de la cantidad de carbohidratos que se  ingieren. La ingesta natural de carbohidratos aumenta la cantidad de azcar (glucosa) en la sangre. El recuento de la cantidad de carbohidratos que se ingieren mejora el control del nivel de [name], lo que ayuda a [name] la diabetes. Un nutricionista puede ayudarlo a crear un plan de alimentacin y a calcular la cantidad de carbohidratos que debe ingerir en cada comida y colacin. Esta informacin no tiene Theme park manager el consejo del mdico. Asegresede hacerle al mdico cualquier pregunta que tenga. Document Revised: 09/27/2019 Document Reviewed: 09/27/2019 Elsevier Patient Education  2021 [employer].

## 2021-03-22 NOTE — Progress Notes (Signed)
Established Patient Office Visit  Subjective:  Patient ID: Joe Kelly, male    DOB: April 14, 1991  Age: 30 y.o. MRN: [id number]  CC:  Chief Complaint  Patient presents with   Follow-up    Interpreter Elio Forget #[id number]   Diabetes    Follow-up for diabetes, last visit 03/15/2021, Blood sugars have been running 220-295 at home, patient brought a log    HPI Joe Kelly is a 30 year old who has history of diabetes with hyperglycemia, hyperlipidemia presents for routine follow-up.  He states that he is compliant with his medication and continues to work on his diet.  His HgbA1c done on 03/08/21 was 13.7%,he reports checking his blood glucose 3 times a day and brought his log.  His fasting blood glucose reading ranges between 240 mg per DL to 285 mg per DL.  His fasting reading today was 295 mg per DL.  His prelunch ranges between 220 mg per DL to 285 mg per DL and his predinner reading ranges between 245 mg per DL to 350 mg per DL.  He denies hypoglycemic symptoms, peripheral neuropathy, admits to experiencing polyphagia and performs daily foot checks.  Overall he states that he is doing well and offers no further complaints.  Past Medical History:  Diagnosis Date   Diabetes mellitus without complication ([place])    Hyperlipidemia     Past Surgical History:  Procedure Laterality Date   NO PAST SURGERIES      Family History  Problem Relation Age of Onset   Diabetes Mother    Healthy Father     Social History   Socioeconomic History   Marital status: Single    Spouse name: Not on file   Number of children: Not on file   Years of education: Not on file   Highest education level: Not on file  Occupational History   Not on file  Tobacco Use   Smoking status: Former    Types: Cigarettes    Quit date: 12/2020    Years since quitting: 0.3   Smokeless tobacco: Never   Tobacco comments:    1 cigarette 3 times per week  Vaping Use   Vaping Use: Never used   Substance and Sexual Activity   Alcohol use: Not Currently    Comment: last use 12 years ago   Drug use: Not Currently    Types: Marijuana    Comment: quit 2 years ago   Sexual activity: Not on file  Other Topics Concern   Not on file  Social History Narrative   Not on file   Social Determinants of Health   Financial Resource Strain: Not on file  Food Insecurity: No Food Insecurity   Worried About Running Out of Food in the Last Year: Never true   [place] in the Last Year: Never true  Transportation Needs: No Transportation Needs   Lack of Transportation (Medical): No   Lack of Transportation (Non-Medical): No  Physical Activity: Not on file  Stress: Not on file  Social Connections: Not on file  Intimate Partner Violence: Not on file    Outpatient Medications Prior to Visit  Medication Sig Dispense Refill   atorvastatin (LIPITOR) 80 MG tablet Take 1 tablet (80 mg total) by mouth daily. 30 tablet 3   blood glucose meter kit and supplies KIT Dispense based on patient and insurance preference. Use up to four times daily as directed to monitor blood glucose 1 each 0   Insulin Pen  Needle 32G X 4 MM MISC Use as directed with insulin pens. 200 each 2   omega-3 acid ethyl esters (LOVAZA) 1 g capsule Take 2 capsules (2 g total) by mouth 2 (two) times daily. 120 capsule 0   Insulin Glargine (BASAGLAR KWIKPEN) 100 UNIT/ML Inject 13 units under the skin once daily. 15 mL 5   insulin lispro (HUMALOG KWIKPEN) 100 UNIT/ML KwikPen Inject 7 units under the skin three times daily before meals. 15 mL 5   albuterol (VENTOLIN HFA) 108 (90 Base) MCG/ACT inhaler Inhale 2 puffs into the lungs every 6 (six) hours as needed for wheezing or shortness of breath. (Patient not taking: Reported on 03/22/2021) 8 g 0   potassium chloride SA (KLOR-CON) 20 MEQ tablet Take 1 tablet (20 mEq total) by mouth daily for 5 days. 5 tablet 0   No facility-administered medications prior to visit.    Allergies   Allergen Reactions   Coconut Flavor     Dizziness     ROS Review of Systems  Constitutional: Negative.   HENT: Negative.    Respiratory: Negative.    Cardiovascular: Negative.   Endocrine: Positive for polyphagia.  Skin: Negative.   Neurological: Negative.      Objective:    Physical Exam HENT:     Head: Normocephalic and atraumatic.     Mouth/Throat:     Mouth: Mucous membranes are moist.  Eyes:     Extraocular Movements: Extraocular movements intact.     Conjunctiva/sclera: Conjunctivae normal.     Pupils: Pupils are equal, round, and reactive to light.  Cardiovascular:     Rate and Rhythm: Normal rate and regular rhythm.     Pulses: Normal pulses.     Heart sounds: Normal heart sounds.  Pulmonary:     Effort: Pulmonary effort is normal.     Breath sounds: Normal breath sounds.  Skin:    General: Skin is warm.  Neurological:     General: No focal deficit present.     Mental Status: He is alert and oriented to person, place, and time. Mental status is at baseline.    BP 109/69 (BP Location: Right Arm, Patient Position: Sitting, Cuff Size: Small)   Pulse 94   Temp 99.2 F (37.3 C)   Resp 18   Ht $R'5\' 2"'FL$  (1.575 m)   Wt 112 lb 11.2 oz (51.1 kg)   SpO2 95%   BMI 20.61 kg/m  Wt Readings from Last 3 Encounters:  03/22/21 112 lb 11.2 oz (51.1 kg)  03/15/21 108 lb 9.6 oz (49.3 kg)  03/09/21 103 lb 9.9 oz (47 kg)     Health Maintenance Due  Topic Date Due   PNEUMOCOCCAL POLYSACCHARIDE VACCINE AGE 20-64 HIGH RISK  Never done   COVID-19 Vaccine (1) Never done   Pneumococcal Vaccine 36-69 Years old (1 - PCV) Never done   FOOT EXAM  Never done   OPHTHALMOLOGY EXAM  Never done   Hepatitis C Screening  Never done   TETANUS/TDAP  Never done    There are no preventive care reminders to display for this patient.  Lab Results  Component Value Date   TSH 1.934 12/05/2020   Lab Results  Component Value Date   WBC 14.6 (H) 03/09/2021   HGB 16.4 03/09/2021   HCT  50.9 03/09/2021   MCV 95.1 03/09/2021   PLT 373 03/09/2021   Lab Results  Component Value Date   NA 130 (L) 03/11/2021   K 3.1 (L) 03/11/2021  CO2 28 03/11/2021   GLUCOSE 185 (H) 03/11/2021   BUN 7 03/11/2021   CREATININE 0.50 (L) 03/11/2021   BILITOT 1.7 (H) 03/09/2021   ALKPHOS 99 03/09/2021   AST 14 (L) 03/09/2021   ALT 15 03/09/2021   PROT 7.4 03/09/2021   ALBUMIN 4.3 03/09/2021   CALCIUM 8.6 (L) 03/11/2021   ANIONGAP 7 03/11/2021   EGFR 144 02/07/2021   Lab Results  Component Value Date   CHOL 422 (H) 01/17/2021   Lab Results  Component Value Date   HDL 24 (L) 01/17/2021   Lab Results  Component Value Date   LDLCALC Comment (A) 01/17/2021   Lab Results  Component Value Date   TRIG 2,123 ([place]) 01/17/2021   Lab Results  Component Value Date   CHOLHDL 17.6 (H) 01/17/2021   Lab Results  Component Value Date   HGBA1C 13.7 (A) 03/08/2021      Assessment & Plan:    1. Type 2 diabetes mellitus with hyperglycemia, with long-term current use of insulin ([facility]) -His HgbA1c was 13.7% and his goal should be less than 7%, His blood glucose is trending down, but still in the mid to upper 200's. His Basaglar was increased to 16 units and Humalog to 9 units tid with meals. He was encouraged to check his blood glucose, record and bring log to follow up visit. He was advised that his fasting blood glucose goal should be between 80-130 mg/dl, to continue on low carb/non concentrated sweet diet. - Insulin Glargine (BASAGLAR KWIKPEN) 100 UNIT/ML; Inject 16 Units into the skin at bedtime.  Dispense: 15 mL; Refill: 5 - insulin lispro (HUMALOG KWIKPEN) 100 UNIT/ML KwikPen; Inject 9 Units into the skin 3 (three) times daily.  Dispense: 15 mL; Refill: 5 - He requested B12 injection, states that he has vials from [place]. Will check B12 and Folate Panel; Future     Follow-up: Return in about 2 weeks (around 04/05/2021), or if symptoms worsen or fail to improve.    Debbie Bellucci Jerold Coombe, NP

## 2021-03-28 ENCOUNTER — Other Ambulatory Visit: Payer: Self-pay | Admitting: Gerontology

## 2021-03-28 ENCOUNTER — Other Ambulatory Visit: Payer: Self-pay

## 2021-03-28 DIAGNOSIS — Z794 Long term (current) use of insulin: Secondary | ICD-10-CM

## 2021-03-28 DIAGNOSIS — E1165 Type 2 diabetes mellitus with hyperglycemia: Secondary | ICD-10-CM

## 2021-03-29 ENCOUNTER — Ambulatory Visit: Payer: Self-pay | Admitting: Gerontology

## 2021-03-29 VITALS — BP 113/77 | HR 68 | Temp 97.5°F | Resp 16 | Ht 61.2 in | Wt 114.4 lb

## 2021-03-29 DIAGNOSIS — E1165 Type 2 diabetes mellitus with hyperglycemia: Secondary | ICD-10-CM

## 2021-03-29 DIAGNOSIS — Z794 Long term (current) use of insulin: Secondary | ICD-10-CM

## 2021-03-29 LAB — GLUCOSE, POCT (MANUAL RESULT ENTRY): POC Glucose: 486 mg/dL — AB (ref 70–99)

## 2021-03-29 MED ORDER — INSULIN LISPRO (1 UNIT DIAL) 100 UNIT/ML (KWIKPEN)
9.0000 [IU] | PEN_INJECTOR | Freq: Three times a day (TID) | SUBCUTANEOUS | 5 refills | Status: DC
  Filled 2021-03-29: qty 15, 56d supply, fill #0

## 2021-03-29 MED ORDER — BASAGLAR KWIKPEN 100 UNIT/ML ~~LOC~~ SOPN
18.0000 [IU] | PEN_INJECTOR | Freq: Every day | SUBCUTANEOUS | 5 refills | Status: DC
Start: 2021-03-29 — End: 2021-10-11
  Filled 2021-03-29: qty 15, 83d supply, fill #0

## 2021-03-29 NOTE — Patient Instructions (Signed)
Insulin Aspart Injection Qu es este medicamento? La INSULINA ASPART es una insulina de origen [place]. Este medicamento disminuye la cantidad de Banker. Este medicamento es una insulina de accin rpida que comienza a actuar ms pronto que la insulina regular. La duracin esms corta que la insulina regular. Este medicamento puede ser utilizado para otros usos; si tiene Designer, industrial/product preguntaconsulte con su proveedor de atencin mdica o con su farmacutico. MARCAS COMUNES: Fiasp, Fiasp AT&T, Northwest Airlines, NovoLog, NovoLogFlexpen, NovoLog PenFill Qu le debo informar a mi profesional de la salud antes de tomar estemedicamento? Necesitan saber si usted presenta alguno de los siguientes problemas osituaciones: episodios de bajo nivel de azcar en la sangre enfermedad ocular, problemas de la visin enfermedad renal enfermedad heptica una reaccin alrgica o inusual a la insulina, al metacresol, a otros medicamentos, alimentos, colorantes o conservantes si est embarazada o buscando quedar embarazada si estamamantando a un beb Cmo debo [employer]? Este medicamento se administra mediante inyeccin por va subcutnea. selo exactamente como se le indique. Es importante seguir las instrucciones de su profesional de la salud o su mdico. Si est usando Novolog, debe comenzar su comida dentro de los 5 a 10 minutos despus de la inyeccin. Si est [employer], debe comenzar su comida al momento de la inyeccin o dentro de los 20 minutos despus de la inyeccin. Tenga comida lista antes de la inyeccin. No se demore en empezar a comer. Le ensearn a usar PPL Corporation y a Dispensing optician dosis para actividades y [place]. No use ms insulina de la recetada.No use con mayor o menor frecuencia de la recetada. Siempre revise la apariencia de la insulina antes de usarla. Este medicamento debe ser transparente y sin color, como el [place]. No lo use si est turbio,espeso, con color o si  tiene partculas slidas. Si [place] un inyector, asegrese de Scientist, clinical (histocompatibility and immunogenetics) exterior de la aguja antesde usar la dosis. Es importante que deseche las agujas y las jeringas usadas en un recipiente resistente a los pinchazos. No las deseche en la basura. Si no tiene un recipiente resistente a los pinchazos, llame a su farmacutico o proveedor deatencin de la salud para obtenerlo. Este frmaco viene con INSTRUCCIONES DE USO. Pdale a su farmacutico que le indique cmo usar este frmaco. Lea la informacin cuidadosamente. Hable con sufarmacutico o su proveedor de atencin mdica si tiene alguna pregunta. Hable con su pediatra para informarse acerca del uso de este medicamento en nios. Aunque este medicamento se puede recetar a nios tan pequeos como de 2aos de edad con ciertas afecciones, existen precauciones que deben tomarse. Sobredosis: Pngase en contacto inmediatamente con un centro toxicolgico o unasala de urgencia si usted cree que haya tomado demasiado medicamento. ATENCIN: [employer] es solo para usted. No comparta este medicamento connadie. Qu sucede si me olvido de una dosis? Es importante de no olvidar una dosis. Su profesional de la salud o su mdico debe hablar con usted acerca de algn plan para dosis olvidadas. Si olvida unadosis, siga su plan. No use dosis dobles. Qu puede interactuar con este medicamento? otros medicamentos para la diabetes Muchos medicamentos pueden aumentar o disminuir el nivel de Aeronautical engineer. Estos incluyen: bebidas con alcohol medicamentos antivirales para el VIH o SIDA aspirina y medicamentos tipo aspirina ciertos medicamentos para la depresin, ansiedad o trastornos psicticos cromo diurticos hormonas femeninas, como estrgenos o progestinas, y pldoras anticonceptivas medicamentos para el corazn isoniazida IMAO, tales como [place], [place], [place], [place] y [place] hormonas  masculinas o esteroides anablicos medicamentos para bajar de  peso medicamentos para alergias, asma, resfros o tos niacina AINE, medicamentos para el dolor y la inflamacin, como ibuprofeno o naproxeno octreotida pentamidina fenitona probenecida antibiticos del grupo de las quinolonas, tales como ciprofloxacino, levofloxacino y ofloxacino algunos suplementos dietticos a base de hierbas medicamentos esteroideos, como la prednisona o la cortisonasulfametoxasol; trimetoprima medicamento para la tiroides Algunos medicamentos pueden ocultar los sntomas de advertencia de niveles bajos de Banker. Es posible que deba monitorear ms atentamente su nivel de azcar en la sangre si est tomando uno de estos medicamentos. Estosmedicamentos incluyen: betabloqueadores, tales como atenolol, metoprolol, propranolol clonidinaguanetidina reserpina Puede ser que esta lista no menciona todas las posibles interacciones. Informe a su profesional de [employer] de [employer] productos a base de hierbas, medicamentos de [place] o suplementos nutritivos que est tomando. Si usted fuma, consume bebidas alcohlicas o si utiliza drogas ilegales, indqueselo tambin a su profesional de [employer]. Algunas sustancias pueden interactuar consu medicamento. A qu debo estar atento al usar PPL Corporation? Visite a su profesional de [employer] o a su mdico para que revise Location manager. Se monitorizar una prueba llamada Hemoglobina A1C (A1C). Es un anlisis de sangre sencillo. Mide su control del nivel de azcar en la sangre durante losltimos 2 a 3 meses. Se le realizar esta prueba cada 3 a 6 meses. Aprenda a revisar su nivel de azcar en la sangre. Conozca los sntomas delnivel bajo y elevado de azcar en la sangre y cmo controlarlos. Siempre lleve con usted una fuente rpida de azcar por si tiene sntomas de nivel bajo de azcar [employer]. Algunos ejemplos incluyen caramelos duros de azcar o tabletas de glucosa. Asegrese de que otras personas sepan que usted se  puede ahogar si come o bebe cuando presenta sntomas graves de [place] bajo de azcar en la sangre, como convulsiones o prdida del conocimiento. Debenobtener ayuda mdica de inmediato. Informe a su mdico o profesional de la salud si tiene niveles elevados de Banker. Es posible que deba cambiar la dosis de su medicamento. Si est enfermo o hace ms ejercicio que lo habitual, es posible que necesitecambiar la dosis de su medicamento. No saltee comidas. Pregunte a su mdico o profesional de la salud si debe evitar el alcohol. Muchos productos de venta libre para la tos y el resfro contienen azcar o alcohol. Estos pueden afectar los niveles de Aeronautical engineer. Asegrese de [employer] tipo correcto de [place] para el tipo de insulina que [place]. Intente no [place] y tipo de [place] o [place], a menos que se lo indique su profesional de la salud o mdico. Multimedia programmer de [place] o tipo de insulina puede causar niveles peligrosamente elevados o bajos de Banker. Siempre tenga a mano un suministro extra de insulina, jeringas y Novice. Use la jeringa solamente una vez. Deseche la [place] Rudd y la aguja en unrecipiente cerrado para evitar que alguien se pinche accidentalmente. Los inyectores y cartuchos de insulina nunca deben compartirse. Incluso si se cambia la aguja, al compartir se pueden contagiar virus como la hepatitis o elVIH. Cada vez que reciba una nueva caja de agujas de [place], verifique que sean del mismo tipo de las que est entrenado para Water engineer. Si no es as, pdale a Producer, television/film/video de la salud que le muestre cmo usar correctamente este nuevo tipode agujas. Use un brazalete o una cadena de identificacin mdica, y lleve con Nicholes Rough  tarjeta que describa su enfermedad, detalles de su medicamento y horario dedosis. Qu efectos secundarios puedo tener al Boston Scientificutilizar este medicamento? Efectos secundarios que debe informar a su mdico o a su profesional de lasalud tan pronto como  sea posible: Therapist, artreacciones alrgicas, como erupcin cutnea, picazn o urticarias, e hinchazn de la cara, los labios o la lengua problemas respiratorios signos y sntomas de niveles elevados de azcar en la sangre, tales como Colemanmareo, boca seca, piel seca, aliento frutal, nuseas, dolor de Bogalusaestmago, aumento del apetito o la sed, aumento de la frecuencia urinaria signos y sntomas de niveles bajos de International aid/development workerazcar en la sangre, tales como ansiedad, confusin, Omegamareos, aumento del apetito, debilidad o cansancio inusuales, sudoracin, temblores, fro, irritabilidad, dolor de cabeza, visin borrosa, ritmo cardiaco rpido y prdida delconocimiento Efectos secundarios que generalmente no requieren atencin mdica (infrmelos asu mdico o a su profesional de la salud si persisten o si son molestos): aumento o disminucin del tejido adiposo debajo de la piel debido al uso excesivo de un lugar de inyeccin en particular picazn, ardor, hinchazn oerupcin en el lugar de la inyeccin Puede ser que esta lista no menciona todos los posibles efectos secundarios. Comunquese a su mdico por asesoramiento mdico Hewlett-Packardsobre los efectos secundarios. Usted puede informar los efectos secundarios a la FDA por telfono al1-800-FDA-1088. Dnde debo guardar mi medicina? Mantenga fuera del alcance de los nios. Frascos sin abrir: Loney HeringFrascos de Novolog: Guarde en el refrigerador a una temperatura de [place]entre 2 y 8 grados Celsius (36 y 46 grados Hospital doctorahrenheit) o a Publishing rights managertemperatura ambiente de menos de 30 grados Celsius (86 grados Fahrenheit). No congele o utilice si la insulina ha [place]estado congelada. Proteja de la luz y del Market researchercalor excesivo. Si se guarda a Publishing rights managertemperatura ambiente, el frasco debe desecharse despus de [address] das. Deseche todo el medicamento que no haya usado ni abierto que haya sido almacenado en elrefrigerador despus de la fecha de vencimiento. Frascos de Fiasp: Guarde en el refrigerador a una temperatura de [place]entre 2 y 8 grados Celsius (36 y 46 grados  Hospital doctorahrenheit) o a Publishing rights managertemperatura ambiente de menos de 30 grados Celsius (86 grados Fahrenheit). No congele o utilice si la insulina ha [place]estado congelada. Proteja de la luz y del Market researchercalor excesivo. Si se guarda a Publishing rights managertemperatura ambiente, el frasco debe desecharse despus de [address] das. Deseche todo el medicamento que no haya usado ni abierto que haya sido almacenado en elrefrigerador despus de la fecha de vencimiento. Inyectores y cartuchos sin abrir: Novolog Flexpens y cartuchos: Guarde en el refrigerador a una temperatura de [place]entre 2 y 8 grados Celsius (36 y 46 grados Hospital doctorahrenheit) o a Publishing rights managertemperatura ambiente de menos de 30 grados Celsius (86 grados Fahrenheit). No congele o utilice si la insulina ha [place]estado congelada. Proteja de la luz y del Market researchercalor excesivo. Si se guarda a Publishing rights managertemperatura ambiente, Probation officerel inyector o el cartucho debe desecharse despus de [address] das. Deseche todo el medicamento que no haya usado ni abiertoque haya sido almacenado en el refrigerador despus de la fecha de vencimiento. Inyectores Fiasp FlexTouch: Guarde en el refrigerador a una temperatura de [place]entre 2 y 8 grados Celsius (36 y 46 grados Hospital doctorahrenheit) o a Publishing rights managertemperatura ambiente de menos de 30 grados Celsius (86 grados Fahrenheit). No congele o utilice si la insulina ha [place]estado congelada. Proteja de la luz y del Market researchercalor excesivo. Si se guarda a Publishing rights managertemperatura ambiente, Probation officerel inyector debe desecharse despus de [address] das. Deseche todo el medicamento que no haya usado ni abierto que haya sidoalmacenado en el refrigerador despus  de la fecha de vencimiento. Cartuchos Fiasp FlexTouch: Guarde a Publishing rights manager inferior a 30 grados Celsius (86 grados Fahrenheit). No la mantenga refrigerada ni la congele. Mantenga alejado del calor y la luz. Deseche el cartucho despus de 28 das,incluso si todava le queda insulina. Frascos que est usando: Loney Hering de Novolog: Guarde en el refrigerador o a temperatura ambiente de menos de 30 grados Celsius (86 grados Fahrenheit). No congele.  Mantenga alejado delcalor y Statistician. Deseche el frasco abierto despus de [address] Frascos de Fiasp: Guarde en el refrigerador o a temperatura ambiente de menos de 30 grados Celsius (86 grados Fahrenheit). No congele. Mantenga alejado delcalor y Statistician. Deseche el frasco abierto despus de [address] Inyectores y cartuchos que est usando: Novolog Flexpens y cartuchos: Guarde a Marketing executive ambiente de menos de 30 grados Celsius (86 grados Fahrenheit). No la mantenga refrigerada ni la congele. Mantenga alejado del calor y la luz. Deseche el inyector o el cartuchodespus de [address], incluso si todava le queda insulina. Inyectores Fiasp FlexTouch: Set designer o a Marketing executive ambiente de menos de 30 grados Celsius (86 grados Fahrenheit). No congele. Mantenga alejado del calor y la luz. Deseche el inyector despus de [address] incluso sitodava le queda insulina. Cartuchos Fiasp FlexTouch: Guarde a Publishing rights manager inferior a 30 grados Celsius (86 grados Fahrenheit). No la mantenga refrigerada ni la congele. Mantenga alejado del calor y la luz. Deseche el cartucho despus de 28 das,incluso si todava le queda insulina. ATENCIN: Este folleto es un resumen. Puede ser que no cubra toda la posible informacin. Si usted tiene preguntas acerca de esta medicina, consulte con sumdico, su farmacutico o su profesional de Radiographer, therapeutic.  2022 Elsevier/Gold Standard (2020-02-22 00:00:00) Plan de accin para la diabetes mellitus Diabetes Mellitus Action Plan Seguir un plan de accin para la diabetes es una forma de controlar sus sntomas de diabetes (diabetes mellitus). El plan se codifica con colores para ayudarlo a comprender qu acciones necesita tomar en funcin de los sntomas que est teniendo. Si tiene sntomas pertenecientes a la zona roja, necesita buscar atencin mdica inmediatamente. Si tiene sntomas pertenecientes a la zona amarilla, est teniendo problemas. Si tiene sntomas pertenecientes a  la zona verde, significa que se Materials engineer. Aprender y comprender la diabetes puede Designer, fashion/clothing. Siga el plan que elabor con el mdico. Conozca el rango deseado para su nivel de azcar en la sangre (glucosa), y revise su plan de tratamiento con su mdico en cada visita. El rango deseado para mi nivel de azcar en la sangre es__________________________ mg/dl. Zona roja Obtenga ayuda de inmediato si observa cualquiera de estos sntomas: Un resultado de azcar en la sangre que est por debajo de 54 mg/dl (3 mmol/l). Un nivel de azcar en la sangre mayor o igual que 240 mg/dl (40,9 mmol/l) durante 2 das seguidos. Confusin o dificultad para pensar con claridad. Dificultad para respirar. Malestar o fiebre durante 2 o ms [employer] no mejora. Niveles moderados o altos de cetonas en la [place]. Sentirse cansado o sin energa. Si tiene cualquiera de los sntomas pertenecientes a la zona roja, no espere para ver si desaparecen. Solicite atencin mdica de inmediato. Comunquese con el servicio de emergencias de su localidad (911 en los [place]). No conduzca por sus propios medios [employer]. Si tiene un nivel de azcar en la sangre muy bajo (hipoglucemia grave) y no puede ingerir ningn alimento ni bebida, tal vez necesite glucagn. Asegrese de que un familiar o amigo  sepa controlarle el nivel de azcar en la sangre y aplicarle glucagn. Puede necesitar tratamiento en un hospital paraesta afeccin. Zona amarilla Si tiene alguno de los siguientes sntomas, su diabetes no est controlada y usted Economist algunos cambios: Un nivel de azcar en la sangre mayor o igual que 240 mg/dl (11,9 mmol/l) durante 2 das seguidos. Un resultado de azcar en la sangre que est por debajo de 70 mg/dl (3,9 mmol/l). Otros sntomas de hipoglucemia, como: Temblores o sensacin de desvanecimiento. Confusin o irritabilidad. Sensacin de [place]. Latidos cardacos acelerados. Si tiene algn sntoma  de la zona amarilla: Trate la hipoglucemia comiendo o bebiendo 15 gramos de hidratos de carbono de accin rpida. Siga las regla 15:15: Consuma 15 gramos de hidratos de carbono de accin rpida, como: 1 pomo de glucosa en gel. 4 comprimidos de glucosa. 4 onzas (120 ml) de jugo de frutas. 4 onzas (120 ml) de refresco comn (no diettico). Controle su nivel de azcar en la sangre 15 minutos despus de ingerir el hidrato de carbono. Si este nuevo nivel de azcar en la sangre todava es igual o menor que 70 mg/dl (3,9 mmol/l), ingiera nuevamente 15 gramos de un hidrato de carbono. Si el nivel de azcar en la sangre no supera los 70 mg/dl (3,9 mmol/l) despus de 3 intentos, solicite ayuda mdica de inmediato. Ingiera una comida o un refrigerio en el transcurso de 1 hora despus de que el nivel de azcar en la sangre se haya normalizado. Siga tomando los medicamentos diarios como se lo haya indicado el mdico. Controle su nivel de azcar en la sangre con ms frecuencia que lo hara normalmente. Google. Llame al mdico si tiene problemas para Futures trader de azcar en la sangre dentro del rango deseado.  Zona verde Estos signos significan que se encuentra bien y puede continuar haciendo lo que est haciendo para Chief Operating Officer la diabetes: Su nivel de azcar en la sangre est en el rango deseado. En la [employer], el nivel de International aid/development worker en la sangre antes de una comida (preprandial) debera ser de 80 a 130 mg/dl (de 4.4 a 7.2 mmol/l). Se siente bien, y pueda volver a [employer] diarias. Si se encuentra en la zona verde, contine controlando su diabetes como se lo haya indicado el mdico. Para hacer esto: Siga una dieta saludable. Haga ejercicio regularmente. Controle su nivel de azcar en la sangre como se lo haya indicado el mdico. Tome los medicamentos como se lo haya indicado el mdico.  Dnde buscar ms informacin American Diabetes Association (ADA)  (Asociacin Estadounidense de la Diabetes): diabetes.org Association of Diabetes Care & Education Specialists (ADCES) (Asociacin de Especialistas en Atencin y Educacin sobre la Diabetes): diabeteseducator.org Resumen Seguir un plan de accin para la diabetes, es una forma de controlar sus sntomas de diabetes. El plan se codifica con colores para ayudarlo a comprender qu acciones necesita tomar en funcin de los sntomas que est teniendo. Siga el plan que elabor con el mdico. Asegrese de conocer su nivel deseado de azcar en la sangre. Revise el plan de tratamiento con el mdico en todas las consultas. Esta informacin no tiene Theme park manager el consejo del mdico. Asegresede hacerle al mdico cualquier pregunta que tenga. Document Revised: 03/30/2020 Document Reviewed: 03/30/2020 Elsevier Patient Education  2022 [employer].

## 2021-03-29 NOTE — Progress Notes (Signed)
[facility]   Progress Note: General Provider: Wolfgang Phoenix, NP  SUBJECTIVE:   Joe Kelly is a 30 y.o. male who  has a past medical history of Diabetes mellitus without complication ([place]) and Hyperlipidemia.. The patient presents today for diabetes follow-up.   He states that he was diagnosed with diabetes 3 months ago. Spanish interpreter was used during this visit. He reports checking blood glucose three times a day; however, he didn't bring a glucose log. He endorses his average fasting blood glucose is between 215 mg/dl to 246m/dl and this am was 220 mg/dl His average pre-lunch ranges  between 230 mg/dl to 260 mg/dl, and his average bedtime blood glucose is between 218 mg/dl and 260 mg/dl He states that he takes Glargine 16 units at bedtime and Novolog 9 units TID with meals. He skips lunch and Novolog dose on Thursday and Saturday due to long work hours. His blood glucose at the clinic was 486 mg/dl; he stated that he hadn't taken his Novolog since 11 am today and had chips and flavored water about one hour ago. He denies peripheral neuropathy, hypo or hyperglycemia symptoms. He reports that he follows low carb/a low concentrated sweets diet and he is compliant with his medications. He denies the use of alcohol or tobacco products. He has no other complaints. Overall, he states that he's doing well and offers no further complaint.  Review of Systems  Constitutional:  Positive for weight loss (states he lost about 30 lbs in the last 3 months).  HENT: Negative.    Eyes: Negative.   Respiratory: Negative.    Cardiovascular: Negative.   Gastrointestinal: Negative.   Genitourinary: Negative.   Musculoskeletal: Negative.   Skin: Negative.   Neurological: Negative.   Endo/Heme/Allergies: Negative.   Psychiatric/Behavioral: Negative.      OBJECTIVE: BP 113/77 (BP Location: Left Arm)   Pulse 68   Temp (!) 97.5 F (36.4 C) (Oral)   Resp 16   Ht 5'  1.2" (1.554 m)   Wt 114 lb 6.4 oz (51.9 kg)   SpO2 96%   BMI 21.47 kg/m   Wt Readings from Last 3 Encounters:  03/29/21 114 lb 6.4 oz (51.9 kg)  03/28/21 112 lb 1.6 oz (50.8 kg)  03/22/21 112 lb 11.2 oz (51.1 kg)     Physical Exam Constitutional:      Appearance: Normal appearance.  HENT:     Head: Normocephalic.     Mouth/Throat:     Mouth: Mucous membranes are moist.  Cardiovascular:     Rate and Rhythm: Normal rate and regular rhythm.     Pulses: Normal pulses.  Pulmonary:     Effort: Pulmonary effort is normal.     Breath sounds: Normal breath sounds.  Abdominal:     General: Abdomen is flat. Bowel sounds are normal.     Palpations: Abdomen is soft.  Musculoskeletal:        General: Normal range of motion.     Cervical back: Normal range of motion.  Skin:    General: Skin is warm and dry.  Neurological:     General: No focal deficit present.     Mental Status: He is alert and oriented to person, place, and time.  Psychiatric:        Mood and Affect: Mood normal.        Behavior: Behavior normal.        Thought Content: Thought content normal.   Negative monofilament test  ASSESSMENT/PLAN:  1. Type 2 diabetes mellitus with hyperglycemia, without long-term current use of insulin ([place]) Your HgbA1c was 13.7% on 03/08/21, and your goal should be less than 7% -Discussed in length the importance of taking insulin. - Insulin Glargine (BASAGLAR KWIKPEN) 100 UNIT/ML; Inject 18 Units into the skin at bedtime.  Dispense: 15 mL; Refill: 5 - insulin lispro (HUMALOG KWIKPEN) 100 UNIT/ML KwikPen; Inject 9 Units into the skin 3 (three) times daily. Sliding Scale Insulin Take extra 1 unit if blood glucose is between 200-250 Take extra 2 units if blood glucose is 251-300 Take extra 3 units if blood glucose is 301-350 Take extra 4 units if blood glucose is 351-400 If blood glucose is over 401 please contact the clinic or go to the nearest emergency room.  Dispense: 15 mL; Refill:  5 -Insulin Glargine increased to 18 units at bedtime. Advise to check blood glucose fasting and before administering insulin and to bring blood glucose log to the next appointment. Fasting blood glucose should be between 80 mg/dl to 143m/dl -Awaiting on GAD antibodies result to determine if you are type I diabetes. -He is advised to continue on low carb/a low concentrated sweets diet and exercise as tolerated.  He was advise to do daily foot checks. - Comp Met (CMET) - Ambulatory referral to diabetic education -Ambulatory referral to Endocrinology  2. Hypocalcemia - Vitamin D (25 hydroxy) - PTH, Intact and Calcium - Albumin   Return in about 2 weeks (around 04/12/2021).    The patient was given clear instructions to go to ER or return to medical center if symptoms do not improve, worsen or new problems develop. The patient verbalized understanding and agreed with plan of care.  Eligio Angert, [place]

## 2021-03-30 ENCOUNTER — Other Ambulatory Visit: Payer: Self-pay

## 2021-03-30 LAB — COMPREHENSIVE METABOLIC PANEL
ALT: 23 IU/L (ref 0–44)
AST: 19 IU/L (ref 0–40)
Albumin/Globulin Ratio: 1.9 (ref 1.2–2.2)
Albumin: 4.3 g/dL (ref 4.0–5.0)
Alkaline Phosphatase: 191 IU/L — ABNORMAL HIGH (ref 44–121)
BUN/Creatinine Ratio: 22 — ABNORMAL HIGH (ref 9–20)
BUN: 14 mg/dL (ref 6–20)
Bilirubin Total: 0.5 mg/dL (ref 0.0–1.2)
CO2: 27 mmol/L (ref 20–29)
Calcium: 9.5 mg/dL (ref 8.7–10.2)
Chloride: 92 mmol/L — ABNORMAL LOW (ref 96–106)
Creatinine, Ser: 0.64 mg/dL — ABNORMAL LOW (ref 0.76–1.27)
Globulin, Total: 2.3 g/dL (ref 1.5–4.5)
Glucose: 450 mg/dL — ABNORMAL HIGH (ref 65–99)
Potassium: 4.4 mmol/L (ref 3.5–5.2)
Sodium: 131 mmol/L — ABNORMAL LOW (ref 134–144)
Total Protein: 6.6 g/dL (ref 6.0–8.5)
eGFR: 130 mL/min/{1.73_m2} (ref >59)

## 2021-03-30 LAB — B12 AND FOLATE PANEL
Folate: 16.9 ng/mL (ref >3.0)
Vitamin B-12: 798 pg/mL (ref 232–1245)

## 2021-03-30 LAB — GLUTAMIC ACID DECARBOXYLASE AUTO ABS: Glutamic Acid Decarb Ab: <5 U/mL (ref 0.0–5.0)

## 2021-03-30 LAB — PTH, INTACT AND CALCIUM: PTH: 16 pg/mL (ref 15–65)

## 2021-03-30 LAB — VITAMIN D 25 HYDROXY (VIT D DEFICIENCY, FRACTURES): Vit D, 25-Hydroxy: 19.9 ng/mL — ABNORMAL LOW (ref 30.0–100.0)

## 2021-04-05 ENCOUNTER — Ambulatory Visit: Payer: Self-pay | Admitting: Gerontology

## 2021-04-12 ENCOUNTER — Ambulatory Visit: Payer: Self-pay

## 2021-04-19 ENCOUNTER — Ambulatory Visit: Payer: Self-pay | Admitting: Gerontology

## 2021-04-19 ENCOUNTER — Other Ambulatory Visit: Payer: Self-pay

## 2021-04-19 VITALS — BP 114/75 | HR 77 | Temp 97.5°F | Resp 16 | Ht 61.5 in | Wt 114.0 lb

## 2021-04-19 DIAGNOSIS — E1165 Type 2 diabetes mellitus with hyperglycemia: Secondary | ICD-10-CM

## 2021-05-02 ENCOUNTER — Telehealth: Payer: Self-pay | Admitting: Pharmacy Technician

## 2021-05-02 NOTE — Telephone Encounter (Signed)
Patient indicated that it is only he and his son living in his household.  Adjusted gross income on taxes exceeds [facility] eligibility criteria.  Pt notified.  Sherilyn Dacosta Care Manager [facility]  [address] [place], [place]  [address]   May 02, 2021    Jarrod Hernandez-Arciniega [address] [place], [place]  [address]  Alonza Smoker,  La presente es para informarle de que ya no es elegible para recibir ayuda con los medicamentos de la [facility].  La(s) razn(es) son las siguientes:    __X__Sus ingresos mensuales totales en bruto del hogar son superiores al 300% del nivel federal de pobreza.   _____Los [employer] tangibles (ahorros, cuentas corrientes, acciones/bonos, pensin, jubilacin, etc.) son superiores             a Hydrographic surveyor.  _____Usted es elegible para recibir beneficios de Medicaid, del [facility] o del Programa de             Asistencia de Medicamentos para el VIH. _____Usted es eligible para recibir beneficios del plan de Medicare Parte "D" _____Tiene un seguro que le cubre las Industrial/product designer.  _____Usted no es residente del [place]. ____  No ha proporcionado toda la documentacin que se le ha pedido (informacin de prueba de ingresos del              30 y/o la solicitud de admisin del paciente, certificado del DOH,  contrato y formulario del              representante del paciente).  La ayuda con los medicamentos se reanudar una vez que toda la informacin financiera que se le ha solicitado haya sido entregada a Technical sales engineer.  Si tiene preguntas, por favor pngase en contacto con nuestra clnica llamando al [phone].    Karl Pock,  [facility]

## 2021-05-10 ENCOUNTER — Ambulatory Visit: Payer: Self-pay | Admitting: Family Medicine

## 2021-05-10 ENCOUNTER — Other Ambulatory Visit: Payer: Self-pay

## 2021-05-10 VITALS — BP 115/77 | HR 99 | Ht 61.6 in | Wt 112.8 lb

## 2021-05-10 DIAGNOSIS — E781 Pure hyperglyceridemia: Secondary | ICD-10-CM

## 2021-05-10 DIAGNOSIS — E1169 Type 2 diabetes mellitus with other specified complication: Secondary | ICD-10-CM

## 2021-05-10 DIAGNOSIS — E1165 Type 2 diabetes mellitus with hyperglycemia: Secondary | ICD-10-CM

## 2021-05-10 LAB — POCT GLYCOSYLATED HEMOGLOBIN (HGB A1C): Hemoglobin A1C: 14 % — AB (ref 4.0–5.6)

## 2021-05-10 LAB — GLUCOSE, POCT (MANUAL RESULT ENTRY): POC Glucose: 439 mg/dl — AB (ref 70–99)

## 2021-05-10 MED ORDER — ATORVASTATIN CALCIUM 80 MG PO TABS
80.0000 mg | ORAL_TABLET | Freq: Every day | ORAL | 5 refills | Status: DC
  Filled 2021-05-10: qty 30, 30d supply, fill #0

## 2021-05-10 MED ORDER — OMEGA-3-ACID ETHYL ESTERS 1 G PO CAPS
2.0000 g | ORAL_CAPSULE | Freq: Two times a day (BID) | ORAL | 5 refills | Status: DC
  Filled 2021-05-10: qty 120, 30d supply, fill #0

## 2021-05-10 NOTE — Progress Notes (Signed)
Established Patient Office Visit  Subjective:  Patient ID: Joe Kelly, male    DOB: [birth year]  Age: 30 y.o. MRN: [id number]  CC:  Chief Complaint  Patient presents with   Follow-up    HPI Joe Kelly presents for f/u DM/hyperlipidemi/hypocalcemia. No c/o except for hair loss. Stopped Vascepa and lipitor --unsure why.  Past Medical History:  Diagnosis Date   Diabetes mellitus without complication ([place])    Hyperlipidemia     Past Surgical History:  Procedure Laterality Date   NO PAST SURGERIES      Family History  Problem Relation Age of Onset   Diabetes Mother    Healthy Father     Social History   Socioeconomic History   Marital status: Single    Spouse name: Not on file   Number of children: Not on file   Years of education: Not on file   Highest education level: Not on file  Occupational History   Not on file  Tobacco Use   Smoking status: Former    Types: Cigarettes    Quit date: 12/2020    Years since quitting: 0.4   Smokeless tobacco: Never  Vaping Use   Vaping Use: Never used  Substance and Sexual Activity   Alcohol use: Not Currently    Comment: last use 12 years ago   Drug use: Not Currently    Types: Marijuana    Comment: quit 2 years ago   Sexual activity: Not on file  Other Topics Concern   Not on file  Social History Narrative   Not on file   Social Determinants of Health   Financial Resource Strain: Not on file  Food Insecurity: No Food Insecurity   Worried About Running Out of Food in the Last Year: Never true   [place] in the Last Year: Never true  Transportation Needs: No Transportation Needs   Lack of Transportation (Medical): No   Lack of Transportation (Non-Medical): No  Physical Activity: Not on file  Stress: Not on file  Social Connections: Not on file  Intimate Partner Violence: Not on file    Outpatient Medications Prior to Visit  Medication Sig Dispense Refill   albuterol  (VENTOLIN HFA) 108 (90 Base) MCG/ACT inhaler Inhale 2 puffs into the lungs every 6 (six) hours as needed for wheezing or shortness of breath. 8 g 0   atorvastatin (LIPITOR) 80 MG tablet Take 1 tablet (80 mg total) by mouth daily. (Patient not taking: Reported on 04/19/2021) 30 tablet 3   blood glucose meter kit and supplies KIT Dispense based on patient and insurance preference. Use up to four times daily as directed to monitor blood glucose 1 each 0   Insulin Glargine (BASAGLAR KWIKPEN) 100 UNIT/ML Inject 18 Units into the skin at bedtime. 15 mL 5   insulin lispro (HUMALOG KWIKPEN) 100 UNIT/ML KwikPen Inject 9 Units into the skin 3 (three) times daily and follow Sliding Scale Insulin Directions: Take extra 1 unit if blood glucose is 200-250 Take extra 2 units if blood glucose is 251-300 Take extra 3 units if blood glucose is 301-350 Take extra 4 units if blood glucose is 351-400 If blood glucose over 401 please contact the clinic or go to the nearest emergency room. 15 mL 5   Insulin Pen Needle 32G X 4 MM MISC Use as directed with insulin pens. 200 each 2   omega-3 acid ethyl esters (LOVAZA) 1 g capsule Take 2 capsules (2 g total)  by mouth 2 (two) times daily. (Patient not taking: Reported on 04/19/2021) 120 capsule 0   No facility-administered medications prior to visit.    Allergies  Allergen Reactions   Coconut Flavor     Dizziness     ROS Review of Systems    Objective:    Physical Exam Vitals reviewed.  HENT:     Head: Normocephalic and atraumatic.     Right Ear: External ear normal.     Left Ear: External ear normal.  Cardiovascular:     Rate and Rhythm: Normal rate and regular rhythm.     Heart sounds: Normal heart sounds.  Skin:    General: Skin is warm and dry.  Neurological:     General: No focal deficit present.     Mental Status: He is alert and oriented to person, place, and time.  Psychiatric:        Mood and Affect: Mood normal.        Behavior: Behavior  normal.    BP 115/77   Pulse 99   Ht 5' 1.6" (1.565 m)   Wt 112 lb 12.8 oz (51.2 kg)   SpO2 95%   BMI 20.90 kg/m  Wt Readings from Last 3 Encounters:  05/10/21 112 lb 12.8 oz (51.2 kg)  04/19/21 114 lb (51.7 kg)  03/29/21 114 lb 6.4 oz (51.9 kg)     Health Maintenance Due  Topic Date Due   COVID-19 Vaccine (1) Never done   PNEUMOCOCCAL POLYSACCHARIDE VACCINE AGE 14-64 HIGH RISK  Never done   Pneumococcal Vaccine 62-67 Years old (1 - PCV) Never done   FOOT EXAM  Never done   OPHTHALMOLOGY EXAM  Never done   Hepatitis C Screening  Never done   TETANUS/TDAP  Never done   INFLUENZA VACCINE  Never done    There are no preventive care reminders to display for this patient.  Lab Results  Component Value Date   TSH 1.934 12/05/2020   Lab Results  Component Value Date   WBC 14.6 (H) 03/09/2021   HGB 16.4 03/09/2021   HCT 50.9 03/09/2021   MCV 95.1 03/09/2021   PLT 373 03/09/2021   Lab Results  Component Value Date   NA 131 (L) 03/29/2021   K 4.4 03/29/2021   CO2 27 03/29/2021   GLUCOSE 450 (H) 03/29/2021   BUN 14 03/29/2021   CREATININE 0.64 (L) 03/29/2021   BILITOT 0.5 03/29/2021   ALKPHOS 191 (H) 03/29/2021   AST 19 03/29/2021   ALT 23 03/29/2021   PROT 6.6 03/29/2021   ALBUMIN 4.3 03/29/2021   CALCIUM 9.5 03/29/2021   ANIONGAP 7 03/11/2021   EGFR 130 03/29/2021   Lab Results  Component Value Date   CHOL 422 (H) 01/17/2021   Lab Results  Component Value Date   HDL 24 (L) 01/17/2021   Lab Results  Component Value Date   LDLCALC Comment (A) 01/17/2021   Lab Results  Component Value Date   TRIG 2,123 ([place]) 01/17/2021   Lab Results  Component Value Date   CHOLHDL 17.6 (H) 01/17/2021   Lab Results  Component Value Date   HGBA1C 13.7 (A) 03/08/2021      Assessment & Plan:   Problem List Items Addressed This Visit   None 1. Type 2 diabetes mellitus with hyperglycemia, without long-term current use of insulin (Hcc0 A1C worse at >14 Increase  Basaglar 13 u to 16 units_0 Increase hamalog 9 to 12 units. - POCT Glucose (CBG); Future -  POCT HgB A1C; Future - POCT HgB A1C - POCT Glucose (CBG)  2. High triglycerides Restart Vascepa RTC and labs 1 month - atorvastatin (LIPITOR) 80 MG tablet; Take 1 tablet (80 mg total) by mouth daily.  Dispense: 30 tablet; Refill: 5  3. Type 2 diabetes mellitus with hyperglycemia, with long-term current use of insulin ([place]  4. Hyperlipidemia associated with type 2 diabetes mellitus (HCC) Restat Lipitor--labs 1 month 5. Hypocalcemia Resolved.   No orders of the defined types were placed in this encounter.   Follow-up: No follow-ups on file.    Wilhemena Durie, MD

## 2021-05-10 NOTE — Patient Instructions (Addendum)
Increase Basaglar fro 13 units to 16 units.  Increase Humalog 9 to 12 units. Take Vit D 1 daily and Biotin 1 daily.

## 2021-05-11 ENCOUNTER — Other Ambulatory Visit: Payer: Self-pay

## 2021-06-13 ENCOUNTER — Ambulatory Visit: Payer: Self-pay | Admitting: Gerontology

## 2021-06-13 ENCOUNTER — Encounter: Payer: Self-pay | Admitting: Gerontology

## 2021-06-13 ENCOUNTER — Other Ambulatory Visit: Payer: Self-pay

## 2021-06-13 VITALS — BP 102/65 | HR 97 | Temp 97.6°F | Resp 16 | Ht 61.0 in | Wt 105.0 lb

## 2021-06-13 DIAGNOSIS — E1165 Type 2 diabetes mellitus with hyperglycemia: Secondary | ICD-10-CM

## 2021-06-13 LAB — GLUCOSE, POCT (MANUAL RESULT ENTRY): POC Glucose: 423 mg/dl — AB (ref 70–99)

## 2021-06-13 NOTE — Progress Notes (Signed)
Established Patient Office Visit  Subjective:  Patient ID: Joe Kelly, male    DOB: Jul 01, 1991  Age: 30 y.o. MRN: [id number]  CC:  Chief Complaint  Patient presents with   Follow-up   Diabetes    Patient states he has been checking his blood sugars at home and are running 215 fasting.    Joe Kelly   is a 30 y.o. male who  has a past medical history of Diabetes mellitus without complication ([place]) and Hyperlipidemiapresents for lab review.[place] interpreter Elmyra Ricks [phone]. His HgbA1c  done on 05/10/21 was 14%. He checks his blood glucose 2-3 times daily, his fasting blood glucose was 215 mg/dl.  His blood glucose was 423 mg per DL when checked during visit.  He states that he is compliant with his medications and continues to make healthy lifestyle changes.  He denies peripheral neuropathy, hypoglycemia, admits to polydipsia and polyuria and performs daily foot check.  Overall, he states that he is doing well and offers no further complaints.   Past Medical History:  Diagnosis Date   Diabetes mellitus without complication ([place])    Hyperlipidemia     Past Surgical History:  Procedure Laterality Date   NO PAST SURGERIES      Family History  Problem Relation Age of Onset   Diabetes Mother    Healthy Father     Social History   Socioeconomic History   Marital status: Single    Spouse name: Not on file   Number of children: Not on file   Years of education: Not on file   Highest education level: Not on file  Occupational History   Not on file  Tobacco Use   Smoking status: Former    Types: Cigarettes    Quit date: 12/2020    Years since quitting: 0.5   Smokeless tobacco: Never  Vaping Use   Vaping Use: Never used  Substance and Sexual Activity   Alcohol use: Not Currently    Comment: last use 12 years ago   Drug use: Not Currently    Types: Marijuana    Comment: quit 2 years ago   Sexual activity: Not on file  Other Topics  Concern   Not on file  Social History Narrative   Not on file   Social Determinants of Health   Financial Resource Strain: Not on file  Food Insecurity: No Food Insecurity   Worried About Running Out of Food in the Last Year: Never true   [place] in the Last Year: Never true  Transportation Needs: No Transportation Needs   Lack of Transportation (Medical): No   Lack of Transportation (Non-Medical): No  Physical Activity: Not on file  Stress: Not on file  Social Connections: Not on file  Intimate Partner Violence: Not on file    Outpatient Medications Prior to Visit  Medication Sig Dispense Refill   atorvastatin (LIPITOR) 80 MG tablet Take 1 tablet (80 mg total) by mouth daily. 30 tablet 5   blood glucose meter kit and supplies KIT Dispense based on patient and insurance preference. Use up to four times daily as directed to monitor blood glucose 1 each 0   Insulin Glargine (BASAGLAR KWIKPEN) 100 UNIT/ML Inject 18 Units into the skin at bedtime. 15 mL 5   insulin lispro (HUMALOG KWIKPEN) 100 UNIT/ML KwikPen Inject 9 Units into the skin 3 (three) times daily and follow Sliding Scale Insulin Directions: Take extra 1 unit if blood glucose is 200-250  Take extra 2 units if blood glucose is 251-300 Take extra 3 units if blood glucose is 301-350 Take extra 4 units if blood glucose is 351-400 If blood glucose over 401 please contact the clinic or go to the nearest emergency room. 15 mL 5   Insulin Pen Needle 32G X 4 MM MISC Use as directed with insulin pens. 200 each 2   albuterol (VENTOLIN HFA) 108 (90 Base) MCG/ACT inhaler Inhale 2 puffs into the lungs every 6 (six) hours as needed for wheezing or shortness of breath. (Patient not taking: Reported on 05/10/2021) 8 g 0   omega-3 acid ethyl esters (LOVAZA) 1 g capsule Take 2 capsules (2 g total) by mouth 2 (two) times daily. (Patient not taking: Reported on 06/13/2021) 120 capsule 5   No facility-administered medications prior to  visit.    Allergies  Allergen Reactions   Coconut Flavor     Dizziness     ROS Review of Systems  Constitutional: Negative.   HENT: Negative.    Eyes: Negative.   Respiratory: Negative.    Cardiovascular: Negative.   Neurological: Negative.      Objective:    Physical Exam HENT:     Head: Normocephalic and atraumatic.     Mouth/Throat:     Mouth: Mucous membranes are moist.  Eyes:     Extraocular Movements: Extraocular movements intact.     Conjunctiva/sclera: Conjunctivae normal.     Pupils: Pupils are equal, round, and reactive to light.  Cardiovascular:     Rate and Rhythm: Normal rate and regular rhythm.     Pulses: Normal pulses.     Heart sounds: Normal heart sounds.  Pulmonary:     Effort: Pulmonary effort is normal.     Breath sounds: Normal breath sounds.  Skin:    General: Skin is warm.  Neurological:     General: No focal deficit present.     Mental Status: He is alert and oriented to person, place, and time. Mental status is at baseline.    BP 102/65 (BP Location: Left Arm, Patient Position: Sitting, Cuff Size: Normal)   Pulse 97   Temp 97.6 F (36.4 C)   Resp 16   Ht 5' 1"  (1.549 m)   Wt 105 lb (47.6 kg)   SpO2 97%   BMI 19.84 kg/m  Wt Readings from Last 3 Encounters:  06/13/21 105 lb (47.6 kg)  05/10/21 112 lb 12.8 oz (51.2 kg)  04/19/21 114 lb (51.7 kg)   He lost 7 pounds in 5 weeks.  Health Maintenance Due  Topic Date Due   COVID-19 Vaccine (1) Never done   FOOT EXAM  Never done   OPHTHALMOLOGY EXAM  Never done   Hepatitis C Screening  Never done   TETANUS/TDAP  Never done   INFLUENZA VACCINE  Never done    There are no preventive care reminders to display for this patient.  Lab Results  Component Value Date   TSH 1.934 12/05/2020   Lab Results  Component Value Date   WBC 14.6 (H) 03/09/2021   HGB 16.4 03/09/2021   HCT 50.9 03/09/2021   MCV 95.1 03/09/2021   PLT 373 03/09/2021   Lab Results  Component Value Date    NA 131 (L) 03/29/2021   K 4.4 03/29/2021   CO2 27 03/29/2021   GLUCOSE 450 (H) 03/29/2021   BUN 14 03/29/2021   CREATININE 0.64 (L) 03/29/2021   BILITOT 0.5 03/29/2021   ALKPHOS 191 (H) 03/29/2021  AST 19 03/29/2021   ALT 23 03/29/2021   PROT 6.6 03/29/2021   ALBUMIN 4.3 03/29/2021   CALCIUM 9.5 03/29/2021   ANIONGAP 7 03/11/2021   EGFR 130 03/29/2021   Lab Results  Component Value Date   CHOL 422 (H) 01/17/2021   Lab Results  Component Value Date   HDL 24 (L) 01/17/2021   Lab Results  Component Value Date   LDLCALC Comment (A) 01/17/2021   Lab Results  Component Value Date   TRIG 2,123 ([place]) 01/17/2021   Lab Results  Component Value Date   CHOLHDL 17.6 (H) 01/17/2021   Lab Results  Component Value Date   HGBA1C 14.0 (A) 05/10/2021      Assessment & Plan:     1. Type 2 diabetes mellitus with hyperglycemia, without long-term current use of insulin (HCC) -His hemoglobin A1c was 14% his goal should be less than 7%.  His diabetes is not under control due to noncompliance with his care.  He will continue on current medication, advised to check his blood glucose 3 times daily, record and bring the log to follow-up appointment.  He was advised that his fasting blood glucose readings should be between 80 and 130 mg per DL.  He was educated on the complications of Uncontrolled diabetes, and he verbalized understanding.  He was encouraged to continue on low carbohydrate/no concentrated sweet diet. - POCT Glucose (CBG)     Follow-up: Return in about 3 weeks (around 07/04/2021), or if symptoms worsen or fail to improve.    Brevyn Ring Jerold Coombe, NP

## 2021-06-13 NOTE — Patient Instructions (Signed)
Recuento de carbohidratos para la diabetes mellitus en los adultos Carbohydrate Counting for Diabetes Mellitus, Adult El recuento de carbohidratos es un mtodo para llevar un registro de la cantidad de carbohidratos que se ingieren. La ingesta natural de carbohidratos aumenta la cantidad de azcar (glucosa) en la sangre. El recuento de la cantidad de carbohidratos que se ingieren mejora el control del nivel de glucemia, lo que ayuda a manejar la diabetes. Es importante saber la cantidad de carbohidratos que se pueden ingerir en cada comida sin correr ningn riesgo. Esto es diferente en cada persona. Un nutricionista puede ayudarlo a crear un plan de alimentacin y a calcular la cantidad de carbohidratos que debe ingerir en cada comida y colacin. Qu alimentos contienen carbohidratos? Los siguientes alimentos incluyen carbohidratos: Granos, como panes y cereales. Frijoles secos y productos con soja. Verduras con almidn, como papas, guisantes y maz. Frutas y jugos de frutas. Leche y yogur. Dulces y colaciones, como pasteles, galletas, caramelos, papas fritas de bolsa y refrescos. Cmo se calculan los carbohidratos de los alimentos? Hay dos maneras de calcular los carbohidratos de los alimentos. Puede leer las etiquetas de los alimentos o aprender cules son los tamaos de las porciones estndar de los alimentos. Puede usar cualquiera de los dos mtodos o una combinacin de ambos. Usar la etiqueta de informacin nutricional La lista de informacin nutricional est incluida en las etiquetas de casi todas las bebidas y los alimentos envasados de los [place]. Incluye lo siguiente: El tamao de la porcin. Informacin sobre los nutrientes de cada porcin, incluidos los gramos (g) de carbohidratos por porcin. Para usar la informacin nutricional: Decida cuntas porciones va a comer. Multiplique la cantidad de porciones por el nmero de carbohidratos por porcin. El resultado es la cantidad  total de carbohidratos que comer. Conocer los tamaos de las porciones estndar de los alimentos Cuando coma alimentos que contengan carbohidratos y que no estn envasados o no incluyan la informacin nutricional en la etiqueta, debe medir las porciones para poder calcular la cantidad de carbohidratos. Mida los alimentos que comer con una balanza de alimentos o una taza medidora, si es necesario. Decida cuntas porciones de tamao estndar comer. Multiplique el nmero de porciones por 15. En los alimentos que contienen carbohidratos, una porcin equivale a 15 g de carbohidratos. Por ejemplo, si come 2 tazas o 10 onzas (300 g) de fresas, habr comido 2 porciones y 30 g de carbohidratos (2 porciones x 15 g = 30 g). En el caso de las comidas que contienen mezclas de ms de un alimento, como las sopas y los guisos, debe calcular los carbohidratos de cada alimento que se incluye. La siguiente lista contiene los tamaos de porciones estndar de los alimentos ricos en carbohidratos ms comunes. Cada una de estas porciones tiene aproximadamente 15 g de carbohidratos: 1 rebanada de pan. 1 tortilla de seis pulgadas (15 cm). ? de taza o 2 onzas (53 g) de arroz o pasta cocidos.  taza o 3 onzas (85 g) de lentejas o frijoles cocidos o enlatados, escurridos y enjuagados.  taza o 3 onzas (85 g) de verduras con almidn, como guisantes, maz o zapallo.  taza o 4 onzas (120 g) de cereal caliente.  taza o 3 onzas (85 g) de papas hervidas o en pur, o  o 3 onzas (85 g) de una papa grande al horno.  taza o 4 onzas fluidas (118 ml) de jugo de frutas. 1 taza u 8 onzas fluidas (237 ml) de leche. 1 unidad pequea o   4 onzas (106 g) de manzana.  unidad o 2 onzas (63 g) de una banana mediana. 1 taza o 5 oz (150 g) de fresas. 3 tazas o 1 oz (24 g) de palomitas de maz. Cul sera un ejemplo de recuento de carbohidratos? Para calcular el nmero de carbohidratos de este ejemplo de comida, siga los pasos que se  describen a continuacin. Ejemplo de comida 3 onzas (85 g) de pechugas de pollo. ? de taza o 4 onzas (106 g) de arroz integral.  taza o 3 onzas (85 g) de maz. 1 taza u 8 onzas fluidas (237 ml) de leche. 1 taza o 5 onzas (150 g) de fresas con crema batida sin azcar. Clculo de carbohidratos Identifique los alimentos que contienen carbohidratos: Arroz. Maz. Leche. Fresas. Calcule cuntas porciones come de cada alimento: 2 porciones de arroz. 1 porcin de maz. 1 porcin de leche. 1 porcin de fresas. Multiplique cada nmero de porciones por 15 g: 2 porciones de arroz x 15 g = 30 g. 1 porcin de maz x 15 g = 15 g. 1 porcin de leche x 15 g = 15 g. 1 porcin de fresas x 15 g = 15 g. Sume todas las cantidades para conocer el total de gramos de carbohidratos consumidos: 30 g + 15 g + 15 g + 15 g = 75 g de carbohidratos en total. Consejos para seguir este plan Al ir de compras [place] un plan de comidas y luego haga una lista de compras. Compre verduras frescas y congeladas, frutas frescas y congeladas, productos lcteos, huevos, frijoles, lentejas y cereales integrales. Fjese en las etiquetas de los alimentos. Elija los alimentos que tengan ms fibra y menos azcar. Evite los alimentos procesados y los alimentos con azcares agregados. Planificacin de las comidas Trate de consumir la misma cantidad de carbohidratos en cada comida y en cada colacin. Planifique tomar comidas y colaciones regulares y equilibradas. Dnde buscar ms informacin [name]: www.diabetes.org Centers for Disease Control and Prevention (Centros para el Control y la Prevencin de Enfermedades): www.cdc.gov Resumen El recuento de carbohidratos es un mtodo para llevar un registro de la cantidad de carbohidratos que se ingieren. La ingesta natural de carbohidratos aumenta la cantidad de azcar (glucosa) en la sangre. El recuento de la  cantidad de carbohidratos que se ingieren mejora el control del nivel de glucemia, lo que ayuda a manejar la diabetes. Un nutricionista puede ayudarlo a crear un plan de alimentacin y a calcular la cantidad de carbohidratos que debe ingerir en cada comida y colacin. Esta informacin no tiene como fin reemplazar el consejo del mdico. Asegrese de hacerle al mdico cualquier pregunta que tenga. Document Revised: 09/27/2019 Document Reviewed: 09/27/2019 Elsevier Patient Education  2021 [employer] Inc.  

## 2021-07-03 ENCOUNTER — Ambulatory Visit: Payer: Self-pay | Admitting: Gerontology

## 2021-09-15 IMAGING — CR DG CHEST 2V
1 series · 2 of 2 positions shown · non-contrast
Comparison: None.

CLINICAL DATA: Shortness of breath

EXAM:
CHEST - 2 VIEW

[Series 1: dg chest 2 view · 0.14mm/px · 2 of 2 slices shown]
[im 1/2]
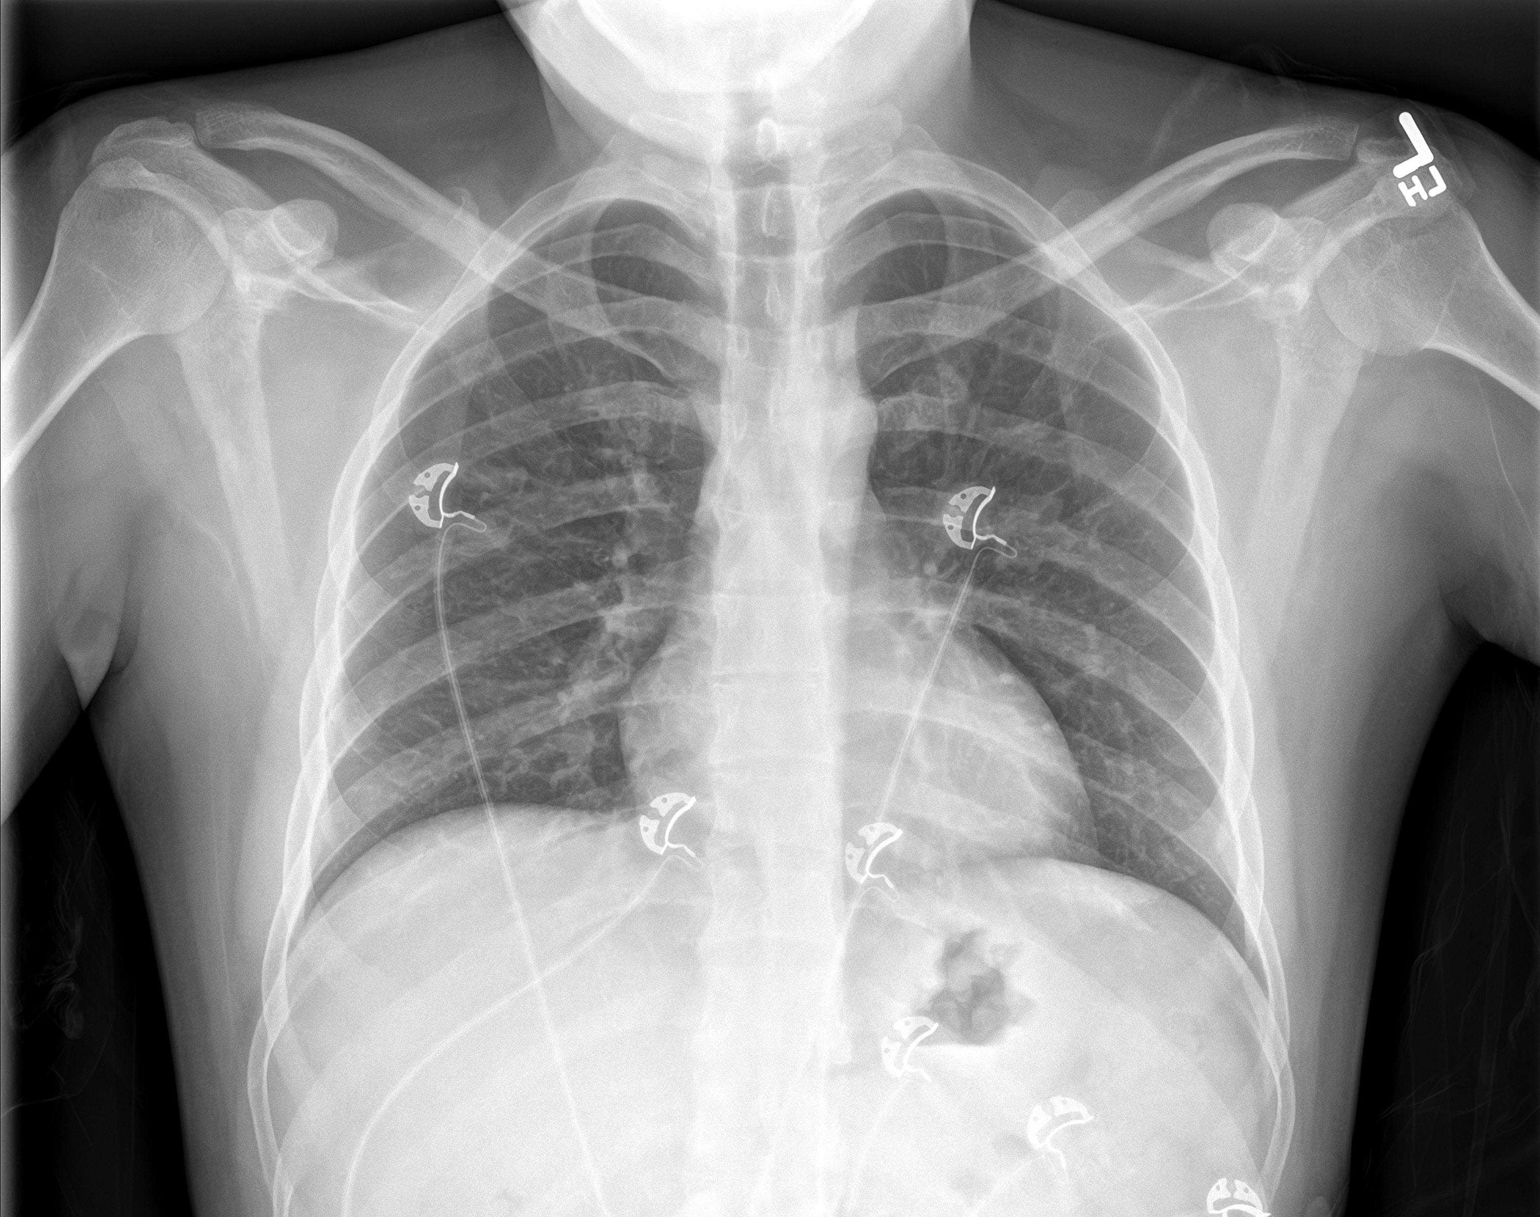
[im 2/2]
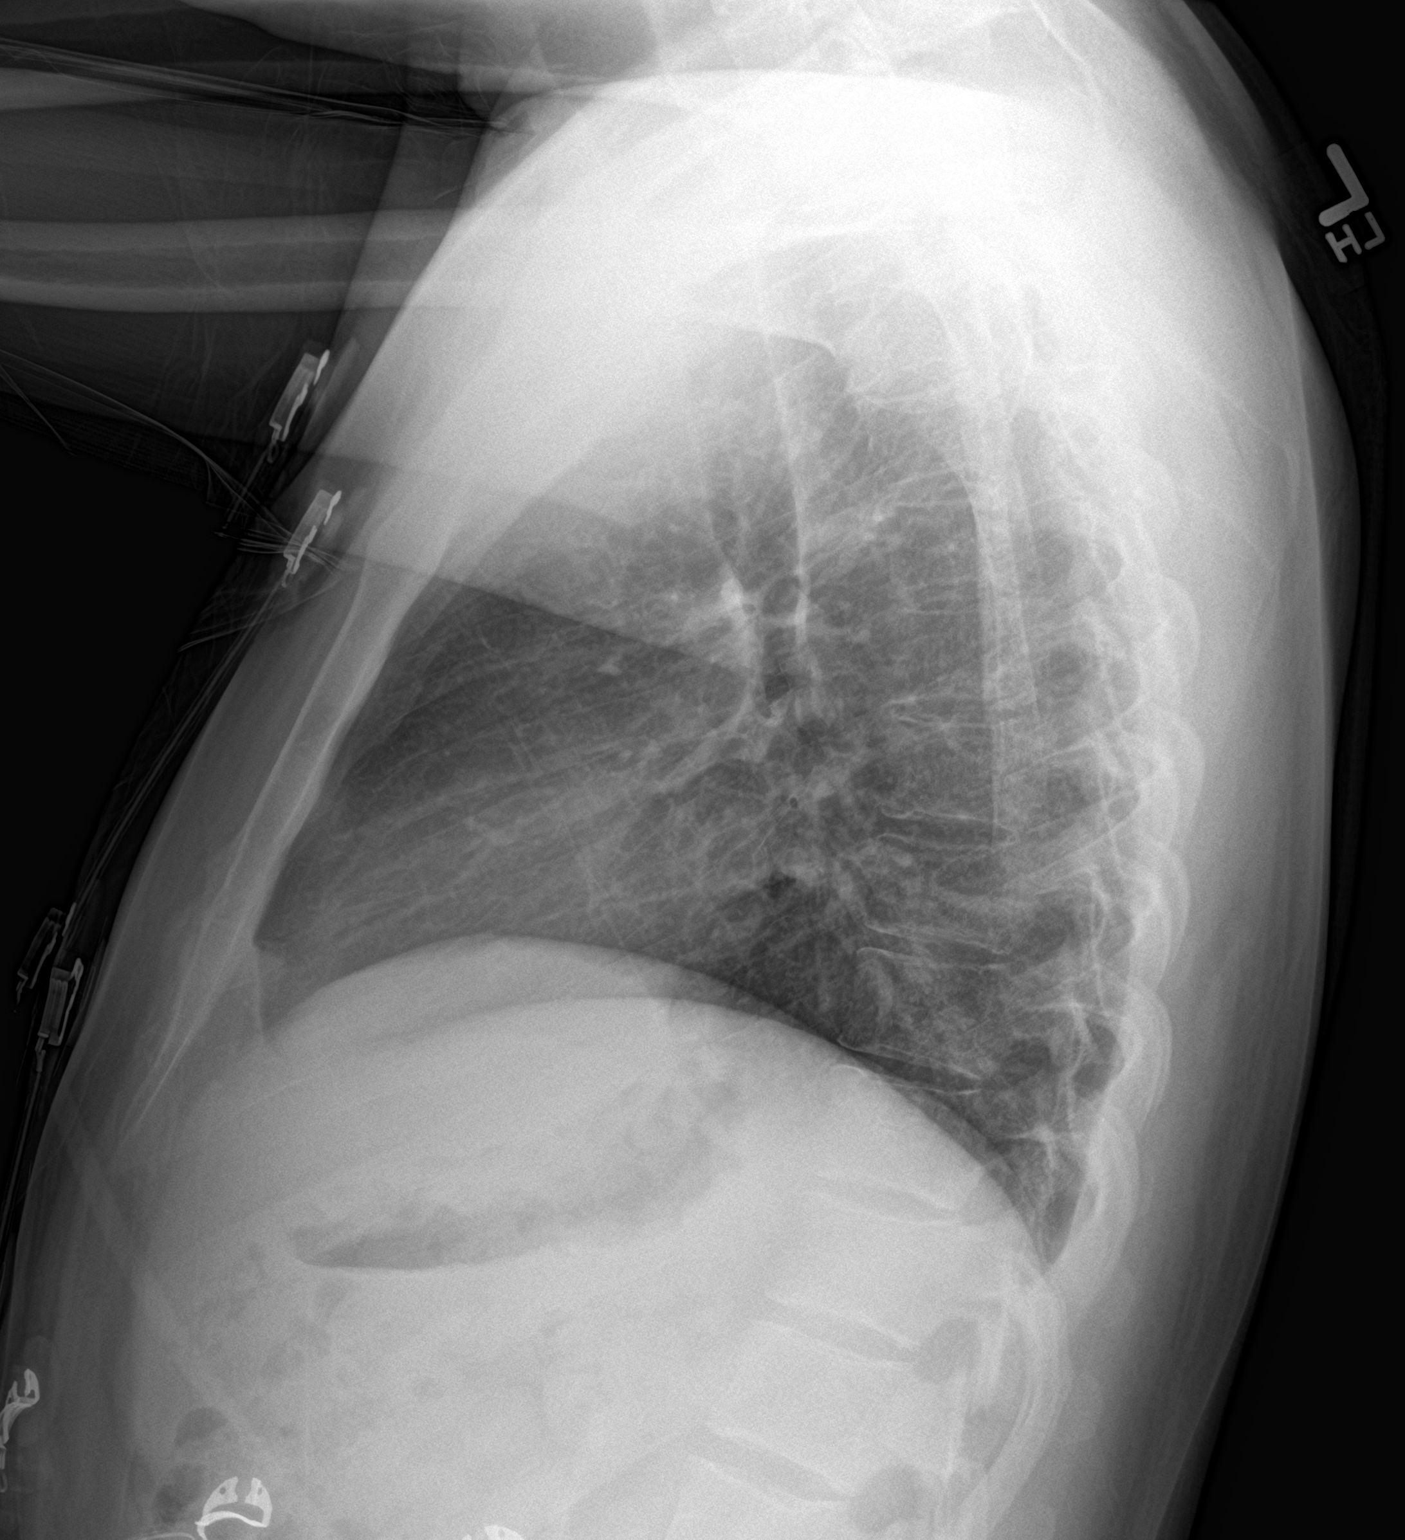

[2 of 2 positions shown; findings below may reference images not displayed]

FINDINGS: The heart size and mediastinal contours are within normal limits.
Both lungs are clear. The visualized skeletal structures are
unremarkable.
IMPRESSION: Negative.

## 2021-10-11 ENCOUNTER — Other Ambulatory Visit: Payer: Self-pay

## 2021-10-11 ENCOUNTER — Ambulatory Visit: Payer: Self-pay | Admitting: Gerontology

## 2021-10-11 VITALS — BP 105/71 | HR 98 | Temp 98.4°F | Resp 16 | Ht 59.0 in | Wt 101.2 lb

## 2021-10-11 DIAGNOSIS — E781 Pure hyperglyceridemia: Secondary | ICD-10-CM

## 2021-10-11 DIAGNOSIS — Z794 Long term (current) use of insulin: Secondary | ICD-10-CM

## 2021-10-11 DIAGNOSIS — E1165 Type 2 diabetes mellitus with hyperglycemia: Secondary | ICD-10-CM

## 2021-10-11 LAB — GLUCOSE, POCT (MANUAL RESULT ENTRY): POC Glucose: 411 mg/dl — AB (ref 70–99)

## 2021-10-11 LAB — POCT GLYCOSYLATED HEMOGLOBIN (HGB A1C): Hemoglobin A1C: 13.1 % — AB (ref 4.0–5.6)

## 2021-10-11 MED ORDER — ATORVASTATIN CALCIUM 80 MG PO TABS
80.0000 mg | ORAL_TABLET | Freq: Every day | ORAL | 5 refills | Status: DC
  Filled 2021-10-11 – 2021-10-15 (×2): qty 30, 30d supply, fill #0

## 2021-10-11 MED ORDER — BASAGLAR KWIKPEN 100 UNIT/ML ~~LOC~~ SOPN
18.0000 [IU] | PEN_INJECTOR | Freq: Every day | SUBCUTANEOUS | 5 refills | Status: DC
  Filled 2021-10-11: qty 15, 83d supply, fill #0
  Filled 2021-10-15: qty 15, 84d supply, fill #0

## 2021-10-11 MED ORDER — INSULIN LISPRO (1 UNIT DIAL) 100 UNIT/ML (KWIKPEN)
9.0000 [IU] | PEN_INJECTOR | Freq: Three times a day (TID) | SUBCUTANEOUS | 5 refills | Status: DC
  Filled 2021-10-11: qty 15, 56d supply, fill #0
  Filled 2021-10-15 – 2022-03-29 (×2): qty 15, 39d supply, fill #0

## 2021-10-11 MED ORDER — OMEGA-3-ACID ETHYL ESTERS 1 G PO CAPS
2.0000 g | ORAL_CAPSULE | Freq: Two times a day (BID) | ORAL | 5 refills | Status: DC
  Filled 2021-10-11 – 2021-10-15 (×2): qty 120, 30d supply, fill #0

## 2021-10-11 NOTE — Progress Notes (Signed)
Patient triaged with the help of [employer] language services, id# [phone]

## 2021-10-11 NOTE — Patient Instructions (Signed)
Recuento de carbohidratos para la diabetes mellitus en los adultos ?Carbohydrate Counting for Diabetes Mellitus, Adult ?El recuento de carbohidratos es un m?todo para llevar un registro de la cantidad de carbohidratos que se ingieren. La ingesta de carbohidratos aumenta la cantidad de az?car (glucosa) en la sangre. El recuento de la cantidad de carbohidratos que ingiere mejora el control de su glucemia. Esto, a su vez, le ayuda a controlar su diabetes. ?Los carbohidratos se miden en gramos (g) por porci?n. Es importante saber la cantidad de carbohidratos (en gramos o por tama?o de porci?n) que se puede ingerir en cada comida. Esto es diferente en cada persona. Un nutricionista puede ayudarlo a crear un plan de alimentaci?n y a calcular la cantidad de carbohidratos que debe ingerir en cada comida y colaci?n. ??Qu? alimentos contienen carbohidratos? ?Los siguientes alimentos incluyen carbohidratos: ?Granos, como panes y cereales. ?Frijoles secos y productos con soja. ?Verduras con almid?n, como papas, guisantes y ma?z. ?Frutas y jugos de frutas. ?Leche y yogur. ?Dulces y colaciones, como pasteles, galletas, caramelos, papas fritas de bolsa y refrescos. ??C?mo se calculan los carbohidratos de los alimentos? ?Hay dos maneras de calcular los carbohidratos de los alimentos. Puede leer las etiquetas de los alimentos o aprender cu?les son los tama?os de las porciones est?ndar de los alimentos. Puede usar cualquiera de estos m?todos o una combinaci?n de ambos. ?Usar la etiqueta de informaci?n nutricional ?La lista de Informaci?n nutricional est? incluida en las etiquetas de casi todas las bebidas y los alimentos envasados de los [place]. Esto incluye lo siguiente: ?El tama?o de la porci?n. ?Informaci?n sobre los nutrientes de cada porci?n, incluidos los gramos de carbohidratos por porci?n. ?Para usar la Informaci?n nutricional, decida cu?ntas porciones tomar?. Luego, multiplique el n?mero de porciones por la cantidad  de carbohidratos por porci?n. El n?mero resultante es la cantidad total de carbohidratos que comer?. ?Conocer los tama?os de las porciones est?ndar de los alimentos ?Cuando coma alimentos que contengan carbohidratos y que no est?n envasados o no incluyan la informaci?n nutricional en la etiqueta, debe medir las porciones para poder calcular los gramos de carbohidratos. ?Mida los alimentos que comer? con una balanza de alimentos o una taza medidora, si es necesario. ?Decida cu?ntas porciones de tama?o est?ndar comer?. ?Multiplique el n?mero de porciones por 15. En los alimentos que contienen carbohidratos, una porci?n equivale a 15 g de carbohidratos. ?Por ejemplo, si come 2 tazas o 10 onzas (300 g) de fresas, habr? comido 2 porciones y 30 g de carbohidratos (2 porciones x 15 g = 30 g). ?En el caso de las comidas que contienen mezclas de m?s de un alimento, como las sopas y los guisos, debe calcular los carbohidratos de cada alimento que se incluye. ?La siguiente lista contiene los tama?os de porciones est?ndar de los alimentos ricos en carbohidratos m?s comunes. Cada una de estas porciones tiene aproximadamente 15 g de carbohidratos: ?1 rebanada de pan. ?1 tortilla de seis pulgadas (15 cm). ?? de taza o 2 onzas (53 g) de arroz o pasta cocidos. ?? taza o 3 onzas (85 g) de lentejas o frijoles cocidos o enlatados, escurridos y enjuagados. ?? taza o 3 onzas (85 g) de verduras con almid?n, como guisantes, ma?z o zapallo. ?? taza o 4 onzas (120 g) de cereal caliente. ?? taza o 3 onzas (85 g) de papas hervidas o en pur?, o ? o 3 onzas (85 g) de una papa grande al horno. ?? taza o 4 onzas fluidas (118 ml) de jugo de frutas. ?1 taza u   8 onzas fluidas (237 ml) de leche. ?1 unidad peque?a o 4 onzas (106 g) de manzana. ?? unidad o 2 onzas (63 g) de una banana mediana. ?1 taza o 5 oz (150 g) de fresas. ?3 tazas o 1 oz (28.3 g) de palomitas de ma?z. ??Cu?l ser?a un ejemplo de recuento de carbohidratos? ?Para calcular los gramos  de carbohidratos de este ejemplo de comida, siga los pasos que se describen a continuaci?n. ?Ejemplo de comida ?3 onzas (85 g) de pechugas de pollo. ?? de taza o 4 onzas (106 g) de arroz integral. ?? taza o 3 onzas (85 g) de ma?z. ?1 taza u 8 onzas fluidas (237 ml) de leche. ?1 taza o 5 onzas (150 g) de fresas con crema batida sin az?car. ?C?lculo de carbohidratos ?Identifique los alimentos que contienen carbohidratos: ?Arroz. ?Ma?z. ?Leche. ?Fresas. ?Calcule cu?ntas porciones come de cada alimento: ?2 porciones de arroz. ?1 porci?n de ma?z. ?1 porci?n de leche. ?1 porci?n de fresas. ?Multiplique cada n?mero de porciones por 15 g: ?2 porciones de arroz x 15 g = 30 g. ?1 porci?n de ma?z x 15 g = 15 g. ?1 porci?n de leche x 15 g = 15 g. ?1 porci?n de fresas x 15 g = 15 g. ?Sume todas las cantidades para conocer el total de gramos de carbohidratos consumidos: ?30 g + 15 g + 15 g + 15 g = 75 g de carbohidratos en total. ?Consejos para seguir este plan ?Al ir de compras ?[place]ore un plan de comidas y luego haga una lista de compras. ?Compre verduras frescas y congeladas, frutas frescas y congeladas, productos l?cteos, huevos, frijoles, lentejas y cereales integrales. ?F?jese en las etiquetas de los alimentos. Elija los alimentos que tengan m?s fibra y menos az?car. ?Evite los alimentos procesados y los alimentos con az?cares agregados. ?Planificaci?n de las comidas ?Trate de consumir la misma cantidad de gramos de carbohidratos en cada comida y en cada colaci?n. ?Planifique tomar comidas y colaciones regulares y equilibradas. ?D?nde buscar m?s informaci?n ?American Diabetes Association (Asociaci?n Estad[place]ense de la Diabetes): diabetes.org ?Centers for Disease Control and Prevention (Centros para el Control y la Prevenci?n de Enfermedades): cdc.gov ?Academy of Nutrition and Dietetics (Academia de Nutrici?n y Diet?tica): eatright.org ?Association of Diabetes Care & Education Specialists (Asociaci?n de Especialistas en  Atenci?n y Educaci?n sobre la Diabetes): diabeteseducator.org ?Resumen ?El recuento de carbohidratos es un m?todo para llevar un registro de la cantidad de carbohidratos que se ingieren. ?La ingesta de carbohidratos aumenta la cantidad de az?car (glucosa) en la sangre. ?El recuento de la cantidad de carbohidratos que ingiere mejora el control de su glucemia. Esto le ayuda a controlar su diabetes. ?Un nutricionista puede ayudarlo a crear un plan de alimentaci?n y a calcular la cantidad de carbohidratos que debe ingerir en cada comida y colaci?n. ?Esta informaci?n no tiene como fin reemplazar el consejo del m?dico. Aseg?rese de hacerle al m?dico cualquier pregunta que tenga. ?Document Revised: 05/09/2020 Document Reviewed: 05/09/2020 ?Elsevier Patient Education ? 2022 Elsevier Inc. ? ?

## 2021-10-11 NOTE — Progress Notes (Signed)
Established Patient Office Visit  Subjective:  Patient ID: Joe Kelly, male    DOB: Sep 14, 1990  Age: 31 y.o. MRN: [id number]  CC: No chief complaint on file.   HPI Joe Kelly is a 31 year old male who has history of diabetes, hyperlipidemia, presents for routine follow-up visit.  Video Spanish interpreter was used during visit.  He is noncompliant with his appointments. His HgbA1c done during visit decreased from 14% to 13.1%, checks blood glucose bid, it was 320 mg/dl prior to visit and it was 411 mg per DL.  He states that he was out of his long-acting insulin glargine for 2 days, and takes his Humalog to times with meals instead of 3.  He denies hypoglycemia, peripheral neuropathy, but endorses polyuria polyphagia polydipsia.  He never picked up his Lipitor for his Omega 3, for his triglyceride 2123 mg per DL.  He denies nausea, vomiting no abdominal pain.  Overall, he states that he is doing well and offers no further complaint.  Past Medical History:  Diagnosis Date   Diabetes mellitus without complication ([place])    Hyperlipidemia     Past Surgical History:  Procedure Laterality Date   NO PAST SURGERIES      Family History  Problem Relation Age of Onset   Diabetes Mother    Healthy Father     Social History   Socioeconomic History   Marital status: Single    Spouse name: Not on file   Number of children: Not on file   Years of education: Not on file   Highest education level: Not on file  Occupational History   Not on file  Tobacco Use   Smoking status: Former    Types: Cigarettes    Quit date: 12/2020    Years since quitting: 0.8   Smokeless tobacco: Never  Vaping Use   Vaping Use: Never used  Substance and Sexual Activity   Alcohol use: Not Currently    Comment: last use 12 years ago   Drug use: Not Currently    Types: Marijuana    Comment: quit 2 years ago   Sexual activity: Not on file  Other Topics Concern   Not on file   Social History Narrative   Not on file   Social Determinants of Health   Financial Resource Strain: Not on file  Food Insecurity: No Food Insecurity   Worried About Running Out of Food in the Last Year: Never true   [place] in the Last Year: Never true  Transportation Needs: No Transportation Needs   Lack of Transportation (Medical): No   Lack of Transportation (Non-Medical): No  Physical Activity: Not on file  Stress: Not on file  Social Connections: Not on file  Intimate Partner Violence: Not on file    Outpatient Medications Prior to Visit  Medication Sig Dispense Refill   blood glucose meter kit and supplies KIT Dispense based on patient and insurance preference. Use up to four times daily as directed to monitor blood glucose 1 each 0   Insulin Pen Needle 32G X 4 MM MISC Use as directed with insulin pens. 200 each 2   insulin lispro (HUMALOG KWIKPEN) 100 UNIT/ML KwikPen Inject 9 Units into the skin 3 (three) times daily and follow Sliding Scale Insulin Directions: Take extra 1 unit if blood glucose is 200-250 Take extra 2 units if blood glucose is 251-300 Take extra 3 units if blood glucose is 301-350 Take extra 4 units if  blood glucose is 351-400 If blood glucose over 401 please contact the clinic or go to the nearest emergency room. 15 mL 5   albuterol (VENTOLIN HFA) 108 (90 Base) MCG/ACT inhaler Inhale 2 puffs into the lungs every 6 (six) hours as needed for wheezing or shortness of breath. (Patient not taking: Reported on 10/11/2021) 8 g 0   atorvastatin (LIPITOR) 80 MG tablet Take 1 tablet (80 mg total) by mouth daily. (Patient not taking: Reported on 10/11/2021) 30 tablet 5   Insulin Glargine (BASAGLAR KWIKPEN) 100 UNIT/ML Inject 18 Units into the skin at bedtime. (Patient not taking: Reported on 10/11/2021) 15 mL 5   omega-3 acid ethyl esters (LOVAZA) 1 g capsule Take 2 capsules (2 g total) by mouth 2 (two) times daily. (Patient not taking: Reported on 10/11/2021) 120  capsule 5   No facility-administered medications prior to visit.    Allergies  Allergen Reactions   Coconut Flavor     Dizziness     ROS Review of Systems  Constitutional: Negative.   Eyes: Negative.   Respiratory: Negative.    Cardiovascular: Negative.   Gastrointestinal: Negative.   Endocrine: Positive for polydipsia, polyphagia and polyuria.  Skin: Negative.   Neurological: Negative.   Psychiatric/Behavioral: Negative.       Objective:    Physical Exam HENT:     Head: Normocephalic and atraumatic.     Mouth/Throat:     Mouth: Mucous membranes are moist.  Eyes:     Extraocular Movements: Extraocular movements intact.     Conjunctiva/sclera: Conjunctivae normal.     Pupils: Pupils are equal, round, and reactive to light.  Cardiovascular:     Rate and Rhythm: Normal rate and regular rhythm.     Pulses: Normal pulses.     Heart sounds: Normal heart sounds.  Pulmonary:     Effort: Pulmonary effort is normal.     Breath sounds: Normal breath sounds.  Skin:    General: Skin is warm.  Neurological:     General: No focal deficit present.     Mental Status: He is alert and oriented to person, place, and time. Mental status is at baseline.  Psychiatric:        Mood and Affect: Mood normal.        Behavior: Behavior normal.        Thought Content: Thought content normal.        Judgment: Judgment normal.    BP 105/71 (BP Location: Left Arm, Patient Position: Sitting, Cuff Size: Normal)    Pulse 98    Temp 98.4 F (36.9 C) (Oral)    Resp 16    Ht 4' 11" (1.499 m)    Wt 101 lb 3.2 oz (45.9 kg)    SpO2 100%    BMI 20.44 kg/m  Wt Readings from Last 3 Encounters:  10/11/21 101 lb 3.2 oz (45.9 kg)  06/13/21 105 lb (47.6 kg)  05/10/21 112 lb 12.8 oz (51.2 kg)     Health Maintenance Due  Topic Date Due   COVID-19 Vaccine (1) Never done   FOOT EXAM  Never done   OPHTHALMOLOGY EXAM  Never done   Hepatitis C Screening  Never done   TETANUS/TDAP  Never done    INFLUENZA VACCINE  Never done    There are no preventive care reminders to display for this patient.  Lab Results  Component Value Date   TSH 1.934 12/05/2020   Lab Results  Component Value Date   WBC  4.3 10/11/2021   HGB 13.6 10/11/2021   HCT 38.2 10/11/2021   MCV 91 10/11/2021   PLT 318 10/11/2021   Lab Results  Component Value Date   NA 133 (L) 10/11/2021   K CANCELED 10/11/2021   CO2 24 10/11/2021   GLUCOSE CANCELED 10/11/2021   BUN 9 10/11/2021   CREATININE 0.52 (L) 10/11/2021   BILITOT <0.2 10/11/2021   ALKPHOS 142 (H) 10/11/2021   AST 15 10/11/2021   ALT 12 10/11/2021   PROT 5.9 (L) 10/11/2021   ALBUMIN 4.0 10/11/2021   CALCIUM 9.0 10/11/2021   ANIONGAP 7 03/11/2021   EGFR 137 10/11/2021   Lab Results  Component Value Date   CHOL 356 (H) 10/11/2021   Lab Results  Component Value Date   HDL 29 (L) 10/11/2021   Lab Results  Component Value Date   LDLCALC Comment (A) 10/11/2021   Lab Results  Component Value Date   TRIG 1,944 ([place]) 10/11/2021   Lab Results  Component Value Date   CHOLHDL 12.3 (H) 10/11/2021   Lab Results  Component Value Date   HGBA1C 13.1 (A) 10/11/2021      Assessment & Plan:    1. Type 2 diabetes mellitus with hyperglycemia, with long-term current use of insulin (HCC) -Routine labs will be checked. - POCT HgB A1C; Future - POCT Glucose (CBG); Future - POCT Glucose (CBG) - POCT HgB A1C - Comp Met (CMET); Future - CBC w/Diff; Future - CBC w/Diff - Comp Met (CMET)  2. Type 2 diabetes mellitus with hyperglycemia, without long-term current use of insulin (HCC) -His hemoglobin A1c was 13.1% his goal should be less than 7%.  He was encouraged to take his medication as ordered, check his blood glucose 3 times a day, record and bring log to follow up appointment.  She was educated on the signs and symptoms of hypoglycemia and DKA To go to the emergency room.  He was also educated on the complications of uncontrolled diabetes,  he verbalized understanding and promised to comply with his treatment. - Insulin Glargine (BASAGLAR KWIKPEN) 100 UNIT/ML; Inject 18 Units into the skin at bedtime.  Dispense: 15 mL; Refill: 5 - insulin lispro (HUMALOG KWIKPEN) 100 UNIT/ML KwikPen; Inject 9 Units into the skin 3 (three) times daily and follow Sliding Scale Insulin Directions: Take extra 1 unit if blood glucose is 200-250; Take extra 2 units if blood glucose is 251-300; Take extra 3 units if blood glucose is 301-350; Take extra 4 units if blood glucose is 351-400; If blood glucose over 401 please contact the clinic or go to the nearest emergency room.  Dispense: 15 mL; Refill: 5  3. High triglycerides -.  He was encouraged to take his medication, continue low-fat/cholesterol diet.  We will recheck lipid. - omega-3 acid ethyl esters (LOVAZA) 1 g capsule; Take 2 capsules (2 g total) by mouth 2 (two) times daily.  Dispense: 120 capsule; Refill: 5 - atorvastatin (LIPITOR) 80 MG tablet; Take 1 tablet (80 mg total) by mouth daily.  Dispense: 30 tablet; Refill: 5 - Lipid panel; Future - Lipid panel     Follow-up: Return in about 2 weeks (around 10/25/2021), or if symptoms worsen or fail to improve.    Renie Stelmach Jerold Coombe, NP

## 2021-10-12 ENCOUNTER — Other Ambulatory Visit: Payer: Self-pay

## 2021-10-12 LAB — LIPID PANEL
Chol/HDL Ratio: 12.3 ratio — ABNORMAL HIGH (ref 0.0–5.0)
Cholesterol, Total: 356 mg/dL — ABNORMAL HIGH (ref 100–199)
HDL: 29 mg/dL — ABNORMAL LOW (ref >39)
Triglycerides: 1944 mg/dL (ref 0–149)

## 2021-10-12 LAB — COMPREHENSIVE METABOLIC PANEL
ALT: 12 IU/L (ref 0–44)
AST: 15 IU/L (ref 0–40)
Albumin/Globulin Ratio: 2.1 (ref 1.2–2.2)
Albumin: 4 g/dL (ref 4.0–5.0)
Alkaline Phosphatase: 142 IU/L — ABNORMAL HIGH (ref 44–121)
BUN/Creatinine Ratio: 17 (ref 9–20)
BUN: 9 mg/dL (ref 6–20)
Bilirubin Total: <0.2 mg/dL (ref 0.0–1.2)
CO2: 24 mmol/L (ref 20–29)
Calcium: 9 mg/dL (ref 8.7–10.2)
Chloride: 95 mmol/L — ABNORMAL LOW (ref 96–106)
Creatinine, Ser: 0.52 mg/dL — ABNORMAL LOW (ref 0.76–1.27)
Globulin, Total: 1.9 g/dL (ref 1.5–4.5)
Sodium: 133 mmol/L — ABNORMAL LOW (ref 134–144)
Total Protein: 5.9 g/dL — ABNORMAL LOW (ref 6.0–8.5)
eGFR: 137 mL/min/{1.73_m2} (ref >59)

## 2021-10-12 LAB — CBC WITH DIFFERENTIAL/PLATELET
Basophils Absolute: 0 10*3/uL (ref 0.0–0.2)
Basos: 0 %
EOS (ABSOLUTE): 0.1 10*3/uL (ref 0.0–0.4)
Eos: 1 %
Hematocrit: 38.2 % (ref 37.5–51.0)
Hemoglobin: 13.6 g/dL (ref 13.0–17.7)
Immature Grans (Abs): 0 10*3/uL (ref 0.0–0.1)
Immature Granulocytes: 1 %
Lymphocytes Absolute: 1.3 10*3/uL (ref 0.7–3.1)
Lymphs: 29 %
MCH: 32.2 pg (ref 26.6–33.0)
MCHC: 35.6 g/dL (ref 31.5–35.7)
MCV: 91 fL (ref 79–97)
Monocytes Absolute: 0.3 10*3/uL (ref 0.1–0.9)
Monocytes: 7 %
Neutrophils Absolute: 2.7 10*3/uL (ref 1.4–7.0)
Neutrophils: 62 %
Platelets: 318 10*3/uL (ref 150–450)
RBC: 4.22 x10E6/uL (ref 4.14–5.80)
RDW: 12.1 % (ref 11.6–15.4)
WBC: 4.3 10*3/uL (ref 3.4–10.8)

## 2021-10-15 ENCOUNTER — Other Ambulatory Visit: Payer: Self-pay

## 2021-10-16 ENCOUNTER — Other Ambulatory Visit: Payer: Self-pay

## 2021-10-16 ENCOUNTER — Ambulatory Visit: Payer: Self-pay | Admitting: Pharmacy Technician

## 2021-10-16 DIAGNOSIS — Z79899 Other long term (current) drug therapy: Secondary | ICD-10-CM

## 2021-10-17 NOTE — Progress Notes (Signed)
Spanish interpretation provided by [facility] ID# [employer].    Completed [facility] application and contract.  Patient agreed to all terms of the [facility] contract.    Patient approved to receive medication assistance at [facility] until time for re-certification in 7505, and as long as eligibility criteria continues to be met.    Provided patient with Civil engineer, contracting based on his particular needs.    [facility]

## 2021-10-18 ENCOUNTER — Other Ambulatory Visit: Payer: Self-pay

## 2021-10-22 ENCOUNTER — Other Ambulatory Visit: Payer: Self-pay

## 2021-10-24 ENCOUNTER — Ambulatory Visit: Payer: Self-pay | Admitting: Pharmacist

## 2021-10-24 ENCOUNTER — Other Ambulatory Visit: Payer: Self-pay

## 2021-10-24 ENCOUNTER — Encounter: Payer: Self-pay | Admitting: Pharmacist

## 2021-10-24 NOTE — Progress Notes (Signed)
[facility] Visit Note  Patient: Joe Kelly MRN: [id number] Date of Birth: [birth year] PCP: Support, [place] 31 y.o. male presents for an MTM visit today at the clinic. A Spanish interpreter, Tammi Klippel, was utilized via phone for communication.  There were no vitals taken for this visit.  Patient Information   Past Medical History:  Diagnosis Date   Diabetes mellitus without complication ([place])    Hyperlipidemia       Past Surgical History:  Procedure Laterality Date   NO PAST SURGERIES       Family History  Problem Relation Age of Onset   Diabetes Mother    Healthy Father     New Diagnoses (since last visit):   Family Support: Good  Lifestyle Diet: Breakfast: Eggs and Cheese. Lunch: Chicken, beans, tuna, avocado Dinner: Cereal, eggs, fish filet Drinks: Water and avocado tea.    Current Exercise Habits: Home exercise routine, Type of exercise: walking, Time (Minutes): 30, Frequency (Times/Week): 7, Weekly Exercise (Minutes/Week): 210       Social History   Substance and Sexual Activity  Alcohol Use Not Currently   Comment: last use 12 years ago      Social History   Tobacco Use  Smoking Status Former   Types: Cigarettes   Quit date: 12/2020   Years since quitting: 0.8  Smokeless Tobacco Never      Health Maintenance  Topic Date Due   COVID-19 Vaccine (1) Never done   FOOT EXAM  Never done   OPHTHALMOLOGY EXAM  Never done   Hepatitis C Screening  Never done   TETANUS/TDAP  Never done   INFLUENZA VACCINE  Never done   URINE MICROALBUMIN  01/17/2022   HEMOGLOBIN A1C  04/10/2022   HIV Screening  Completed   HPV VACCINES  Aged Out   Health Maintenance/Date Completed  Last ED visit: 03/11/21 Last Visit to PCP: 10/11/21 Next Visit to PCP: N/A Specialist Visit: N/A Dental Exam: N/A Eye Exam: N/A Prostate Exam: N/A Pelvic/PAP Exam: N/A Mammogram:  N/A DEXA: N/A Colonoscopy: N/A Flu Vaccine:  Unsure Pneumonia Vaccine: N/A COVID-19 Vaccine:  Unsure Shingrix Vaccine: N/A  Outpatient Encounter Medications as of 10/24/2021  Medication Sig   acetaminophen (TYLENOL) 325 MG tablet Take 325 mg by mouth once as needed.   atorvastatin (LIPITOR) 80 MG tablet Take 1 tablet (80 mg total) by mouth daily.   blood glucose meter kit and supplies KIT Dispense based on patient and insurance preference. Use up to four times daily as directed to monitor blood glucose   Insulin Glargine (BASAGLAR KWIKPEN) 100 UNIT/ML Inject 18 Units into the skin once daily at bedtime.   insulin lispro (HUMALOG KWIKPEN) 100 UNIT/ML KwikPen Inject 9 Units into the skin 3 times daily and follow sliding scale: Take extra 1 unit if sugar is 200-250; Take extra 2 units if 251-300; Take extra 3 units if 301-350; Take extra 4 units if 351-400; If over 401 contact MD or go to ED.   Insulin Pen Needle 32G X 4 MM MISC Use as directed with insulin pens.   omega-3 acid ethyl esters (LOVAZA) 1 g capsule Take 2 capsules (2 g total) by mouth 2 (two) times daily.   albuterol (VENTOLIN HFA) 108 (90 Base) MCG/ACT inhaler Inhale 2 puffs into the lungs every 6 (six) hours as needed for wheezing or shortness of breath. (Patient not taking: Reported on 10/11/2021)   No facility-administered encounter medications on  file as of 10/24/2021.      Assessment and Plan:  Diabetes: Patient reports being complaint to Basaglar and Humalog regimen. However, Patient reports only taking Humalog 13-15 units twice daily and Basaglar 15-18 units once daily. Patient checks blood sugar twice daily after eating and reports ranges of 250-270 in the morning and 300-350 in the evening. Patient does not report any dizziness or being faint and endorses staying away from bread and flour. Patient's a1c was 13.1% on 10/11/21 down from 14.0% on 05/10/21. Counseled Patient on the importance of taking insulin the way the doctor  prescribed, logging blood sugars and keeping blood sugars closer within goal while maintaining a healthy diet. Hyperlipidemia: Patient reports being compliant to atorvastatin and Lovaza regimen. Patient's last lipid panel remains elevated TC 356, TG 1944 on 10/11/21. Patient to follow up with PCP for further advice.    RTC: 1 year Sinclair Ship, PharmD [facility] Phone: [phone] 10/24/21

## 2021-10-25 ENCOUNTER — Ambulatory Visit: Payer: Self-pay

## 2021-11-19 ENCOUNTER — Other Ambulatory Visit: Payer: Self-pay

## 2021-11-19 ENCOUNTER — Ambulatory Visit
Admission: EM | Admit: 2021-11-19 | Discharge: 2021-11-19 | Disposition: A | Discharge status: Home / Self Care | Payer: Self-pay | Attending: Emergency Medicine | Admitting: Emergency Medicine

## 2021-11-19 DIAGNOSIS — L02419 Cutaneous abscess of limb, unspecified: Secondary | ICD-10-CM | POA: Insufficient documentation

## 2021-11-19 DIAGNOSIS — Z23 Encounter for immunization: Secondary | ICD-10-CM

## 2021-11-19 DIAGNOSIS — L03119 Cellulitis of unspecified part of limb: Secondary | ICD-10-CM | POA: Insufficient documentation

## 2021-11-19 DIAGNOSIS — L03116 Cellulitis of left lower limb: Secondary | ICD-10-CM

## 2021-11-19 MED ORDER — CEPHALEXIN 500 MG PO CAPS
1000.0000 mg | ORAL_CAPSULE | Freq: Two times a day (BID) | ORAL | 0 refills | Status: AC
  Filled 2021-11-19: qty 20, 5d supply, fill #0

## 2021-11-19 MED ORDER — IBUPROFEN 600 MG PO TABS
600.0000 mg | ORAL_TABLET | Freq: Four times a day (QID) | ORAL | 0 refills | Status: AC | PRN
  Filled 2021-11-19: qty 28, 7d supply, fill #0

## 2021-11-19 MED ORDER — CEFTRIAXONE SODIUM 1 G IJ SOLR
1.0000 g | Freq: Once | INTRAMUSCULAR | Status: AC
  Administered 2021-11-19: 1 g via INTRAMUSCULAR

## 2021-11-19 MED ORDER — TETANUS-DIPHTH-ACELL PERTUSSIS 5-2.5-18.5 LF-MCG/0.5 IM SUSY
0.5000 mL | PREFILLED_SYRINGE | Freq: Once | INTRAMUSCULAR | Status: AC
  Administered 2021-11-19: 0.5 mL via INTRAMUSCULAR

## 2021-11-19 MED ORDER — DOXYCYCLINE HYCLATE 100 MG PO CAPS
100.0000 mg | ORAL_CAPSULE | Freq: Two times a day (BID) | ORAL | 0 refills | Status: AC
  Filled 2021-11-19: qty 10, 5d supply, fill #0

## 2021-11-19 NOTE — ED Provider Notes (Addendum)
HPI ? ?SUBJECTIVE: ? ?Joe Kelly is a 31 y.o. male who presents with 3 days of left lower extremity pain, purulent and bloody drainage, erythema/discoloration, swelling after wearing some boots that pressed repeatedly against this area.  No fevers, other known trauma, known insect bite, body aches, nausea, vomiting.  He is taking Tylenol with improvement in his pain, he also has cleaned with alcohol and has applied Band-Aids.  Symptoms worse with walking, palpation.  He has a past medical history of diabetes, and states that his sugars have been running within normal limits for him, which is 260-300 range.  He is on insulin, and states that he is working on getting his sugars under control with his PCP.  Also has a history of DKA.  No history of neuropathy, MRSA, chronic kidney disease.  He is unsure of his last tetanus shot.  [facility] ? ?All history obtained through language line video interpreter ? ? ?Past Medical History:  ?Diagnosis Date  ? Diabetes mellitus without complication ([place])   ? Hyperlipidemia   ? ? ?Past Surgical History:  ?Procedure Laterality Date  ? NO PAST SURGERIES    ? ? ?Family History  ?Problem Relation Age of Onset  ? Diabetes Mother   ? Healthy Father   ? ? ?Social History  ? ?Tobacco Use  ? Smoking status: Former  ?  Types: Cigarettes  ?  Quit date: 12/2020  ?  Years since quitting: 0.9  ? Smokeless tobacco: Never  ?Vaping Use  ? Vaping Use: Never used  ?Substance Use Topics  ? Alcohol use: Not Currently  ?  Comment: last use 12 years ago  ? Drug use: Not Currently  ?  Types: Marijuana  ?  Comment: quit 2 years ago  ? ? ? ?Current Facility-Administered Medications:  ?  cefTRIAXone (ROCEPHIN) injection 1 g, 1 g, Intramuscular, Once, Melynda Ripple, MD ?  Tdap (BOOSTRIX) injection 0.5 mL, 0.5 mL, Intramuscular, Once, Melynda Ripple, MD ? ?Current Outpatient Medications:  ?  acetaminophen (TYLENOL) 325 MG tablet, Take 325 mg by mouth once as needed., Disp: ,  Rfl:  ?  atorvastatin (LIPITOR) 80 MG tablet, Take 1 tablet (80 mg total) by mouth daily., Disp: 30 tablet, Rfl: 5 ?  blood glucose meter kit and supplies KIT, Dispense based on patient and insurance preference. Use up to four times daily as directed to monitor blood glucose, Disp: 1 each, Rfl: 0 ?  cephALEXin (KEFLEX) 500 MG capsule, Take 2 capsules (1,000 mg total) by mouth 2 (two) times daily for 5 days., Disp: 20 capsule, Rfl: 0 ?  doxycycline (VIBRAMYCIN) 100 MG capsule, Take 1 capsule (100 mg total) by mouth 2 (two) times daily for 5 days., Disp: 10 capsule, Rfl: 0 ?  ibuprofen (ADVIL) 600 MG tablet, Take 1 tablet (600 mg total) by mouth every 6 (six) hours as needed for up to 7 days., Disp: 28 tablet, Rfl: 0 ?  Insulin Glargine (BASAGLAR KWIKPEN) 100 UNIT/ML, Inject 18 Units into the skin once daily at bedtime., Disp: 15 mL, Rfl: 5 ?  insulin lispro (HUMALOG KWIKPEN) 100 UNIT/ML KwikPen, Inject 9 Units into the skin 3 times daily and follow sliding scale: Take extra 1 unit if sugar is 200-250; Take extra 2 units if 251-300; Take extra 3 units if 301-350; Take extra 4 units if 351-400; If over 401 contact MD or go to ED., Disp: 15 mL, Rfl: 5 ?  Insulin Pen Needle 32G X 4 MM MISC, Use as  directed with insulin pens., Disp: 200 each, Rfl: 2 ?  omega-3 acid ethyl esters (LOVAZA) 1 g capsule, Take 2 capsules (2 g total) by mouth 2 (two) times daily., Disp: 120 capsule, Rfl: 5 ?  albuterol (VENTOLIN HFA) 108 (90 Base) MCG/ACT inhaler, Inhale 2 puffs into the lungs every 6 (six) hours as needed for wheezing or shortness of breath. (Patient not taking: Reported on 10/11/2021), Disp: 8 g, Rfl: 0 ? ?Allergies  ?Allergen Reactions  ? Coconut Flavor   ?  Dizziness   ? ? ? ?ROS ? ?As noted in HPI.  ? ?Physical Exam ? ?BP 117/72 (BP Location: Left Arm)   Pulse 88   Temp 97.6 ?F (36.4 ?C) (Oral)   Resp 16   Ht 5' 3"  (1.6 m)   Wt 49.9 kg   SpO2 96%   BMI 19.49 kg/m?  ? ?Constitutional: Well developed, well nourished, no  acute distress ?Eyes:  EOMI, conjunctiva normal bilaterally ?HENT: Normocephalic, atraumatic,mucus membranes moist ?Respiratory: Normal inspiratory effort ?Cardiovascular: Normal rate ?GI: nondistended ?skin: 4 x 6 cm tender area of erythema, induration with spontaneously draining purulent material distal left lower extremity.  No subcutaneous crepitus.  Positive swelling of the left lower extremity compared to the right.  DP 2+.  Marked area of erythema with a marker for reference. ? ? ? ?Musculoskeletal: See skin exam ?Neurologic: Alert & oriented x 3, no focal neuro deficits ?Psychiatric: Speech and behavior appropriate ? ? ?ED Course ? ? ?Medications  ?cefTRIAXone (ROCEPHIN) injection 1 g (has no administration in time range)  ?Tdap (BOOSTRIX) injection 0.5 mL (has no administration in time range)  ? ? ?Orders Placed This Encounter  ?Procedures  ? Aerobic Culture w Gram Stain (superficial specimen)  ?  Standing Status:   Standing  ?  Number of Occurrences:   1  ? ? ?No results found for this or any previous visit (from the past 24 hour(s)). ?No results found. ? ?ED Clinical Impression ? ?1. Cellulitis and abscess of leg   ?  ? ?ED Assessment/Plan ? ?Patient with a spontaneously draining lower leg abscess/wound.  Since I am not sure as to the origin of this, I am going to update his tetanus in addition to giving him a gram of Rocephin.  Wound culture sent.  Will send him home on doxycycline and Keflex for 5 days, and he will come back here in 24 to 48 hours for recheck.  He is to go to the ER in the interim if he gets worse. ? ?Using the language line, discussed MDM, treatment plan, and plan for follow-up with patient. Discussed sn/sx that should prompt return to the ED. patient agrees with plan.  Answered all questions. ? ?Meds ordered this encounter  ?Medications  ? cefTRIAXone (ROCEPHIN) injection 1 g  ? Tdap (BOOSTRIX) injection 0.5 mL  ? cephALEXin (KEFLEX) 500 MG capsule  ?  Sig: Take 2 capsules (1,000 mg  total) by mouth 2 (two) times daily for 5 days.  ?  Dispense:  20 capsule  ?  Refill:  0  ? doxycycline (VIBRAMYCIN) 100 MG capsule  ?  Sig: Take 1 capsule (100 mg total) by mouth 2 (two) times daily for 5 days.  ?  Dispense:  10 capsule  ?  Refill:  0  ? ibuprofen (ADVIL) 600 MG tablet  ?  Sig: Take 1 tablet (600 mg total) by mouth every 6 (six) hours as needed for up to 7 days.  ?  Dispense:  28 tablet  ?  Refill:  0  ? ? ? ? ?*This clinic note was created using Lobby[employer]. Therefore, there may be occasional mistakes despite careful proofreading. ? ??  ?  ?Melynda Ripple, MD ?11/19/21 2029 ? ?  ?Melynda Ripple, MD ?11/19/21 2034 ? ?

## 2021-11-19 NOTE — Discharge Instructions (Addendum)
We have updated your tetanus, and have given you a shot of Rocephin.  I am sending you home with Keflex and doxycycline.  Make sure you finish both antibiotics, even if you feel better.  You may take the ibuprofen with 1000 mg of Tylenol together 3-4 times a day as needed for pain.  Warm compresses.  Keep this clean with soap and water.  Return here in 24 to 48 hours for wound recheck. ?

## 2021-11-19 NOTE — ED Triage Notes (Signed)
Pt has abscess to his left lower leg, noticed 3 days ago, reports started noticing when he put on his boots.  ? ?Denies drainage, repots pain, area red, warm, and size of gold ball. Noticed darken area in center of abscess.  ? ?OTC Tylenol for pain ?

## 2021-11-20 ENCOUNTER — Other Ambulatory Visit: Payer: Self-pay

## 2021-11-21 ENCOUNTER — Telehealth: Payer: Self-pay | Admitting: Gerontology

## 2021-11-21 NOTE — Telephone Encounter (Signed)
Reschedule for 3/30 at 10:30 am ?

## 2021-11-21 NOTE — Telephone Encounter (Signed)
-----   Message from Rolm Gala, NP sent at 10/25/2021  8:09 PM EST ----- ?He no showed to his appointment, pls reschedule an in person and make a telephone note. Thank you ? ?

## 2021-11-22 LAB — AEROBIC CULTURE W GRAM STAIN (SUPERFICIAL SPECIMEN)

## 2021-11-29 ENCOUNTER — Ambulatory Visit: Payer: Self-pay | Admitting: Gerontology

## 2021-11-29 ENCOUNTER — Encounter: Payer: Self-pay | Admitting: Gerontology

## 2021-11-29 VITALS — BP 109/72 | HR 84 | Temp 97.8°F | Resp 16 | Ht 61.5 in | Wt 111.5 lb

## 2021-11-29 DIAGNOSIS — E1165 Type 2 diabetes mellitus with hyperglycemia: Secondary | ICD-10-CM

## 2021-11-29 DIAGNOSIS — E781 Pure hyperglyceridemia: Secondary | ICD-10-CM

## 2021-11-29 LAB — GLUCOSE, POCT (MANUAL RESULT ENTRY): POC Glucose: 248 mg/dl — AB (ref 70–99)

## 2021-11-29 NOTE — Patient Instructions (Addendum)
Plan de alimentaci?n cardiosaludable ?Heart-Healthy Eating Plan ?Muchos factores influyen en la salud del coraz?n (coronaria), entre ellos, los h?bitos de alimentaci?n y de ejercicio f?sico. El riesgo coronario aumenta cuando hay niveles anormales de grasa (l?pidos) en la [place]. La planificaci?n de las comidas cardiosaludables implica limitar las grasas poco saludables, aumentar las grasas saludables y Radio producer otros cambios en la dieta y el estilo de [place]. ??En qu? consiste el plan? ?El m?dico podr?a recomendarle que haga lo siguiente: ?Limitar la ingesta de grasas al _________ % o menos del total de calor?as por d?a. ?Limitar la ingesta de grasas saturadas al _________ % o menos del total de calor?as por d?a. ?Limitar la cantidad de colesterol en su dieta a menos de _________ mg por d?a. ??Cu?les son algunos consejos para seguir este plan? ?Al cocinar ?Evite fre?r los alimentos a la hora de la cocci?n. Hornear, hervir, grillar y asar a la parrilla son buenas opciones. Otras formas de reducir el consumo de grasas son las siguientes: ?Quite la piel de las aves. ?Quite todas las grasas visibles de las carnes. ?Cocine al vapor las verduras en agua o caldo. ?Planificaci?n de las comidas ? ?En las comidas, imagine dividir su plato en cuartos: ?Llene la mitad del plato con verduras y ensaladas de hojas verdes. ?Llene un cuarto del plato con cereales integrales. ?Llene un cuarto del plato con alimentos con prote?nas magras. ?Coma 4 o 5 porciones de verduras por d?a. Una porci?n equivale a una taza de verduras crudas o cocidas o a 2 tazas de verduras de hojas verdes crudas. ?Coma 4 o 5 porciones de frutas por d?a. Una porci?n equivale a una fruta mediana entera, ? taza de fruta desecada; ? taza de frutas frescas, congeladas o enlatadas; o ? taza de jugo 100 % de fruta. ?Consuma m?s alimentos con fibra soluble. Entre ellos, se incluyen las [place], el br?coli, las [place], los frijoles, los guisantes y Aeronautical engineer. Trate de  consumir de 25 a 30 g de fibra por d?a. ?Aumente el consumo de legumbres, frutos secos y semillas a 4 o 5 porciones por semana. Una porci?n de frijoles o legumbres secos equivale a ? taza despu?s de su cocci?n, una porci?n de frutos secos equivale a ? de taza y [place] porci?n de semillas equivale a 1 cucharada. ?Grasas ?Elija grasas saludables con mayor frecuencia. Elija las grasas monoinsaturadas y [address], como el aceite de oliva y el de canola, las semillas de [place], las nueces, las almendras y las semillas. ?Consuma m?s grasas omega-3. Elija salm?n, caballa, sardinas, at?n, aceite de lino y semillas de lino molidas. Prop?ngase comer pescado al [employer] veces por semana. ?Lea las etiquetas de los alimentos detenidamente para identificar los que contienen grasas trans o altas cantidades de grasas saturadas. ?Limite el consumo de grasas saturadas. Estas se encuentran en productos de origen animal, como carnes, mantequilla y crema. Las grasas saturadas de origen vegetal incluyen aceite de palma, de palmiste y de coco. ?Evite los alimentos con aceites parcialmente hidrogenados. Estos contienen grasas trans. [address] [place], se incluyen margarinas en barra, algunas margarinas untables, galletas dulces y [place] y otros productos horneados. ?Evite las comidas fritas. ?Informaci?n general ?Consuma m?s comida casera y menos de restaurante, de bares y comida r?pida. ?Limite o evite el alcohol. ?Limite los alimentos con alto contenido de almid?n y az?car. ?Baje de peso si es necesario. Perder solo del 5 al 10 % de su peso corporal puede ayudarlo a mejorar su estado de salud general y a Education officer, museum,  como la diabetes y las enfermedades card?acas. ?Controle la ingesta de sal (sodio), especialmente si tiene presi?n arterial alta. Hable con el m?dico acerca de cambiar la ingesta de sodio. ?Intente incorporar m?s comidas vegetarianas cada semana. ??Qu? alimentos puedo comer? ?Frutas ?Frutas frescas, en conserva  (en su jugo natural) o frutas congeladas. ?Verduras ?Verduras frescas o congeladas (crudas, al vapor, asadas o grilladas). Ensaladas de hojas verdes. ?Cereales ?La mayor?a de los cereales. Elija casi siempre trigo integral y cereales integrales. Arroz y pastas, incluido el arroz integral y las pastas elaboradas con trigo integral. ?Carnes y otras prote?nas ?Carnes magras de res, ternera, cerdo y cordero a las que se les haya quitado la grasa visible. Pollo y pavo sin piel. Todos los pescados y mariscos. Pato salvaje, conejo, fais?n y venado. Claras de huevo o sustitutos del huevo bajos en colesterol. Porotos, guisantes y lentejas secos y tofu. Semillas y la mayor?a de los frutos secos. ?L?cteos ?Quesos descremados y semidescremados, entre ellos, ricota y mozzarella. Leche descremada o al 1 % (l?quida, en polvo o evaporada). Suero de leche elaborado con leche semidescremada. Yogur descremado o semidescremado. ?Grasas y aceites ?Margarinas no hidrogenadas (sin grasas trans). Aceites vegetales, incluido el de soja, s?samo, girasol, oliva, man?, c?rtamo, ma?z, canola y semillas de algod?n. Ali?os para ensalada o mayonesa elaborados con aceite vegetal. ?Bebidas ?[place] (mineral o con gas). T? y caf?. Gaseosas diet?ticas. ?Dulces y postres ?Sorbete, gelatina y helado de frutas. Peque?as cantidades de chocolate amargo. ?Limite todos los dulces y postres. ?Ali?os y condimentos ?Todos los ali?os y condimentos. ?Es posible que los productos que se enumeran m?s arriba no constituyan una lista completa de los alimentos y las bebidas que puede tomar. Consulte a un nutricionista para conocer m?s opciones. ??Qu? alimentos no se recomiendan? ?Frutas ?Fruta enlatada en alm?bar espeso. Frutas con salsa de crema o mantequilla. Frutas cocidas en aceite. Limite el consumo de coco. ?Verduras ?Verduras cocinadas con salsas de queso, crema o mantequilla. Verduras fritas. ?Cereales ?Panes elaborados con grasas saturadas o trans, aceites o  leche entera. Croissants. Panecillos dulces. Rosquillas. Galletas con alto contenido de grasas, como las que contienen queso. ?Carnes y otras prote?nas ?Carnes grasas, como perros calientes, costillas de res, salchichas, tocino, asado de costillar o chulet?n. Fiambres con alto contenido de grasas, como salame y mortadela. Caviar. Pato y ganso dom?sticos. V?sceras, como h?gado. ?L?cteos ?Crema, crema agria, queso crema y queso cottage con crema. Quesos enteros. Leche entera o al 2 % (l?quida, evaporada o condensada). Suero de leche entero. Salsa de crema o queso con alto contenido de grasas. Yogur de leche entera. ?Grasas y aceites ?Grasa de carne o materia grasa. Manteca de cacao, aceites hidrogenados, aceite de palma, aceite de coco, aceite de palmiste. Grasas y materias grasas s?lidas, incluida la grasa del tocino, el cerdo salado, la manteca de cerdo y la mantequilla. Sustitutos no l?cteos de la crema. Aderezos para ensalada con queso o crema agria. ?Bebidas ?Refrescos regulares y cualquier bebida con agregado de az?car. ?Dulces y postres ?Glaseados. Pudin. Galletas dulces. Bizcochuelos. Pasteles. Chocolate con leche o chocolate blanco. Alm?bares con mantequilla. Helados o bebidas elaboradas con helado con alto contenido de grasas. ?Es posible que los productos que se enumeran m?s arriba no sean una lista completa de los alimentos y las bebidas que se deben evitar. Consulte a un nutricionista para obtener m?s informaci?n. ?Resumen ?La planificaci?n de las comidas cardiosaludables implica limitar las grasas poco saludables, aumentar las grasas saludables y hacer otros cambios en la dieta y el estilo de   vida. ?Baje de peso si es necesario. Perder solo del 5 al 10 % de su peso corporal puede ayudarlo a mejorar su estado de salud general y a Education officer, museum, como la diabetes y las enfermedades card?acas. ?Prop?ngase seguir una dieta equilibrada, que incluya frutas y verduras, productos l?cteos descremados o  semidescremados, prote?nas magras, frutos secos y legumbres, cereales integrales y aceites y grasas cardiosaludables. ?Esta informaci?n no tiene Theme park manager el consejo del m?dico. Aseg?rese de hac

## 2021-11-29 NOTE — Progress Notes (Signed)
? ?Established Patient Office Visit ? ?Subjective:  ?Patient ID: Joe Kelly, male    DOB: [birth year]  Age: 31 y.o. MRN: [id number] ? ?CC:  ?Chief Complaint  ?Patient presents with  ? Follow-up  ? Diabetes  ? ? ?HPI ? ?Joe Kelly is a 31 year old male who has history of diabetes, hyperlipidemia, presents for routine follow-up visit.  Video Spanish interpreter was used during visit.  The blood glucose checked during the visit was 248 mg/dl ,and he states that he checks his blood glucose twice daily and his fasting reading was 200 mg/dl, his daily reading runs from 200 to 220 mg/dl. ?He denies hypoglycemia, peripheral neuropathy, but reports  polyuria polyphagia polydipsia. He states that he is compliant with his medications and continues to make healthy lifestyle changes.  He also reported that he was seen at the urgent care on 11/19/21 by Dr. Alphonzo Cruise  for a spider bite on the anterior aspect of his left lower leg, and he was given an antibiotics. He reported that he completed the course of the antibiotics.  The spider bite site on left lower extremity  noted  with no signs and symptoms of infection.  He is overall doing well and denies any other complaints.  ? ?Past Medical History:  ?Diagnosis Date  ? Diabetes mellitus without complication ([place])   ? Hyperlipidemia   ? ? ?Past Surgical History:  ?Procedure Laterality Date  ? NO PAST SURGERIES    ? ? ?Family History  ?Problem Relation Age of Onset  ? Diabetes Mother   ? Healthy Father   ? ? ?Social History  ? ?Socioeconomic History  ? Marital status: Single  ?  Spouse name: Not on file  ? Number of children: Not on file  ? Years of education: Not on file  ? Highest education level: Not on file  ?Occupational History  ? Not on file  ?Tobacco Use  ? Smoking status: Former  ?  Types: Cigarettes  ?  Quit date: 12/2020  ?  Years since quitting: 0.9  ? Smokeless tobacco: Never  ?Vaping Use  ? Vaping Use: Never used  ?Substance and Sexual  Activity  ? Alcohol use: Not Currently  ?  Comment: last use 12 years ago  ? Drug use: Not Currently  ?  Types: Marijuana  ?  Comment: quit 2 years ago  ? Sexual activity: Yes  ?Other Topics Concern  ? Not on file  ?Social History Narrative  ? Not on file  ? ?Social Determinants of Health  ? ?Financial Resource Strain: Not on file  ?Food Insecurity: No Food Insecurity  ? Worried About Charity fundraiser in the Last Year: Never true  ? Ran Out of Food in the Last Year: Never true  ?Transportation Needs: No Transportation Needs  ? Lack of Transportation (Medical): No  ? Lack of Transportation (Non-Medical): No  ?Physical Activity: Not on file  ?Stress: Not on file  ?Social Connections: Not on file  ?Intimate Partner Violence: Not on file  ? ? ?Outpatient Medications Prior to Visit  ?Medication Sig Dispense Refill  ? acetaminophen (TYLENOL) 325 MG tablet Take 325 mg by mouth once as needed.    ? albuterol (VENTOLIN HFA) 108 (90 Base) MCG/ACT inhaler Inhale 2 puffs into the lungs every 6 (six) hours as needed for wheezing or shortness of breath. (Patient not taking: Reported on 10/11/2021) 8 g 0  ? atorvastatin (LIPITOR) 80 MG tablet Take 1 tablet (80 mg total) by mouth  daily. 30 tablet 5  ? blood glucose meter kit and supplies KIT Dispense based on patient and insurance preference. Use up to four times daily as directed to monitor blood glucose 1 each 0  ? Insulin Glargine (BASAGLAR KWIKPEN) 100 UNIT/ML Inject 18 Units into the skin once daily at bedtime. 15 mL 5  ? insulin lispro (HUMALOG KWIKPEN) 100 UNIT/ML KwikPen Inject 9 Units into the skin 3 times daily and follow sliding scale: Take extra 1 unit if sugar is 200-250; Take extra 2 units if 251-300; Take extra 3 units if 301-350; Take extra 4 units if 351-400; If over 401 contact MD or go to ED. 15 mL 5  ? Insulin Pen Needle 32G X 4 MM MISC Use as directed with insulin pens. 200 each 2  ? omega-3 acid ethyl esters (LOVAZA) 1 g capsule Take 2 capsules (2 g total) by  mouth 2 (two) times daily. 120 capsule 5  ? ?No facility-administered medications prior to visit.  ? ? ?Allergies  ?Allergen Reactions  ? Coconut Flavor   ?  Dizziness   ? ? ?ROS ?Review of Systems  ?Constitutional: Negative.   ?HENT: Negative.    ?Eyes: Negative.   ?Respiratory: Negative.    ?Gastrointestinal: Negative.   ?Endocrine: Positive for polydipsia (uncontrolled diabetes), polyphagia (uncontrolled diabetes) and polyuria (uncontolled diabetes).  ?Skin:  Negative for wound.  ?     Spider bite site to anterior aspect of left lower extremity, pin point opening  ?Neurological: Negative.   ?Psychiatric/Behavioral: Negative.    ? ?  ?Objective:  ?  ?Physical Exam ?Constitutional:   ?   Appearance: Normal appearance.  ?HENT:  ?   Head: Normocephalic.  ?   Mouth/Throat:  ?   Mouth: Mucous membranes are moist.  ?Eyes:  ?   Extraocular Movements: Extraocular movements intact.  ?   Conjunctiva/sclera: Conjunctivae normal.  ?   Pupils: Pupils are equal, round, and reactive to light.  ?Cardiovascular:  ?   Rate and Rhythm: Normal rate and regular rhythm.  ?   Pulses: Normal pulses.  ?   Heart sounds: Normal heart sounds.  ?Pulmonary:  ?   Effort: Pulmonary effort is normal.  ?   Breath sounds: Normal breath sounds.  ?Musculoskeletal:     ?   General: Normal range of motion.  ?Skin: ?   Capillary Refill: Capillary refill takes more than 3 seconds.  ?   Comments: Spider bite site, pin point opening, no erythema, tenderness, nor edema to anterior aspect of left lower extremity  ?Neurological:  ?   General: No focal deficit present.  ?   Mental Status: He is alert and oriented to person, place, and time. Mental status is at baseline.  ?Psychiatric:     ?   Mood and Affect: Mood normal.     ?   Behavior: Behavior normal.     ?   Thought Content: Thought content normal.     ?   Judgment: Judgment normal.  ? ? ?BP 109/72 (BP Location: Right Arm, Patient Position: Sitting, Cuff Size: Normal)   Pulse 84   Temp 97.8 ?F (36.6  ?C) (Oral)   Resp 16   Ht 5' 1.5" (1.562 m)   Wt 111 lb 8 oz (50.6 kg)   SpO2 98%   BMI 20.73 kg/m?  ?Wt Readings from Last 3 Encounters:  ?11/29/21 111 lb 8 oz (50.6 kg)  ?11/19/21 110 lb (49.9 kg)  ?10/11/21 101 lb 3.2 oz (45.9 kg)  ? ? ? ?  Health Maintenance Due  ?Topic Date Due  ? COVID-19 Vaccine (1) Never done  ? FOOT EXAM  Never done  ? OPHTHALMOLOGY EXAM  Never done  ? Hepatitis C Screening  Never done  ? INFLUENZA VACCINE  Never done  ? URINE MICROALBUMIN  01/17/2022  ? ? ?There are no preventive care reminders to display for this patient. ? ?Lab Results  ?Component Value Date  ? TSH 1.934 12/05/2020  ? ?Lab Results  ?Component Value Date  ? WBC 4.3 10/11/2021  ? HGB 13.6 10/11/2021  ? HCT 38.2 10/11/2021  ? MCV 91 10/11/2021  ? PLT 318 10/11/2021  ? ?Lab Results  ?Component Value Date  ? NA 133 (L) 10/11/2021  ? K CANCELED 10/11/2021  ? CO2 24 10/11/2021  ? GLUCOSE CANCELED 10/11/2021  ? BUN 9 10/11/2021  ? CREATININE 0.52 (L) 10/11/2021  ? BILITOT <0.2 10/11/2021  ? ALKPHOS 142 (H) 10/11/2021  ? AST 15 10/11/2021  ? ALT 12 10/11/2021  ? PROT 5.9 (L) 10/11/2021  ? ALBUMIN 4.0 10/11/2021  ? CALCIUM 9.0 10/11/2021  ? ANIONGAP 7 03/11/2021  ? EGFR 137 10/11/2021  ? ?Lab Results  ?Component Value Date  ? CHOL 356 (H) 10/11/2021  ? ?Lab Results  ?Component Value Date  ? HDL 29 (L) 10/11/2021  ? ?Lab Results  ?Component Value Date  ? [place] Comment (A) 10/11/2021  ? ?Lab Results  ?Component Value Date  ? TRIG 1,944 ([place]) 10/11/2021  ? ?Lab Results  ?Component Value Date  ? CHOLHDL 12.3 (H) 10/11/2021  ? ?Lab Results  ?Component Value Date  ? HGBA1C 13.1 (A) 10/11/2021  ? ? ?  ?Assessment & Plan:  ? ?1. High triglycerides ?He was encouraged to take his medications, continue low-fat/cholesterol diet.  He is on - omega-3 acid ethyl esters (LOVAZA) 1 gram and Atorvastatin 80 mg daily. ? ?2. Type 2 diabetes mellitus with hyperglycemia, with long-term current use of insulin ([place]) ?His hemoglobin A1c  on 2/ 9/23  was 13.1% his goal should be less than 7%, to  be repeated on 01/08/22.  He is on Insulin Glargine (BASAGLAR KWIKPEN) 100 UNIT/ML 18 units at bed time and also sliding scale with insulin lispro (HUMALOG KWIKPEN) 100 U

## 2021-11-29 NOTE — Progress Notes (Signed)
Patient triaged with [facility] Interpreter services, Manual id# [phone] ?

## 2022-01-03 ENCOUNTER — Other Ambulatory Visit: Payer: Self-pay

## 2022-01-03 ENCOUNTER — Ambulatory Visit: Payer: Self-pay | Admitting: Gerontology

## 2022-03-29 ENCOUNTER — Other Ambulatory Visit: Payer: Self-pay

## 2022-10-29 ENCOUNTER — Other Ambulatory Visit: Payer: Self-pay | Admitting: Gerontology

## 2022-10-29 ENCOUNTER — Other Ambulatory Visit: Payer: Self-pay

## 2022-10-29 DIAGNOSIS — E1165 Type 2 diabetes mellitus with hyperglycemia: Secondary | ICD-10-CM

## 2022-10-29 DIAGNOSIS — Z0000 Encounter for general adult medical examination without abnormal findings: Secondary | ICD-10-CM

## 2022-10-30 ENCOUNTER — Other Ambulatory Visit: Payer: Self-pay

## 2022-10-30 DIAGNOSIS — E1165 Type 2 diabetes mellitus with hyperglycemia: Secondary | ICD-10-CM

## 2022-10-30 DIAGNOSIS — Z0000 Encounter for general adult medical examination without abnormal findings: Secondary | ICD-10-CM

## 2022-10-30 DIAGNOSIS — Z794 Long term (current) use of insulin: Secondary | ICD-10-CM

## 2022-10-30 LAB — POCT GLYCOSYLATED HEMOGLOBIN (HGB A1C): Hemoglobin A1C: 14 % — AB (ref 4.0–5.6)

## 2022-10-30 LAB — GLUCOSE, POCT (MANUAL RESULT ENTRY): POC Glucose: 357 mg/dl — AB (ref 70–99)

## 2022-10-30 MED ORDER — INSULIN LISPRO (1 UNIT DIAL) 100 UNIT/ML (KWIKPEN)
9.0000 [IU] | PEN_INJECTOR | Freq: Three times a day (TID) | SUBCUTANEOUS | 5 refills | Status: DC
  Filled 2022-10-30: qty 15, 56d supply, fill #0
  Filled 2023-01-29: qty 15, 56d supply, fill #1

## 2022-10-30 MED FILL — Insulin Glargine Soln Pen-Injector 100 Unit/ML: SUBCUTANEOUS | 83 days supply | Qty: 15 | Fill #0 | Status: AC

## 2022-10-31 ENCOUNTER — Ambulatory Visit: Payer: Self-pay | Admitting: Gerontology

## 2022-10-31 LAB — COMPREHENSIVE METABOLIC PANEL
ALT: 16 IU/L (ref 0–44)
AST: 16 IU/L (ref 0–40)
Albumin/Globulin Ratio: 2 (ref 1.2–2.2)
Albumin: 4.2 g/dL (ref 4.1–5.1)
Alkaline Phosphatase: 168 IU/L — ABNORMAL HIGH (ref 44–121)
BUN/Creatinine Ratio: 23 — ABNORMAL HIGH (ref 9–20)
BUN: 12 mg/dL (ref 6–20)
Bilirubin Total: 0.3 mg/dL (ref 0.0–1.2)
CO2: 26 mmol/L (ref 20–29)
Calcium: 9.4 mg/dL (ref 8.7–10.2)
Chloride: 96 mmol/L (ref 96–106)
Creatinine, Ser: 0.52 mg/dL — ABNORMAL LOW (ref 0.76–1.27)
Globulin, Total: 2.1 g/dL (ref 1.5–4.5)
Glucose: 335 mg/dL — ABNORMAL HIGH (ref 70–99)
Potassium: 4.5 mmol/L (ref 3.5–5.2)
Sodium: 137 mmol/L (ref 134–144)
Total Protein: 6.3 g/dL (ref 6.0–8.5)
eGFR: 136 mL/min/{1.73_m2} (ref >59)

## 2022-10-31 LAB — CBC WITH DIFFERENTIAL/PLATELET
Basophils Absolute: 0 10*3/uL (ref 0.0–0.2)
Basos: 1 %
EOS (ABSOLUTE): 0 10*3/uL (ref 0.0–0.4)
Eos: 1 %
Hematocrit: 36 % — ABNORMAL LOW (ref 37.5–51.0)
Hemoglobin: 12.1 g/dL — ABNORMAL LOW (ref 13.0–17.7)
Immature Grans (Abs): 0 10*3/uL (ref 0.0–0.1)
Immature Granulocytes: 1 %
Lymphocytes Absolute: 1.2 10*3/uL (ref 0.7–3.1)
Lymphs: 30 %
MCH: 31.1 pg (ref 26.6–33.0)
MCHC: 33.6 g/dL (ref 31.5–35.7)
MCV: 93 fL (ref 79–97)
Monocytes Absolute: 0.4 10*3/uL (ref 0.1–0.9)
Monocytes: 9 %
Neutrophils Absolute: 2.4 10*3/uL (ref 1.4–7.0)
Neutrophils: 58 %
Platelets: 241 10*3/uL (ref 150–450)
RBC: 3.89 x10E6/uL — ABNORMAL LOW (ref 4.14–5.80)
RDW: 12.5 % (ref 11.6–15.4)
WBC: 4.1 10*3/uL (ref 3.4–10.8)

## 2022-10-31 LAB — URINALYSIS
Bilirubin, UA: NEGATIVE
Leukocytes,UA: NEGATIVE
Nitrite, UA: NEGATIVE
Protein,UA: NEGATIVE
RBC, UA: NEGATIVE
Specific Gravity, UA: >=1.03 — AB (ref 1.005–1.030)
Urobilinogen, Ur: 0.2 mg/dL (ref 0.2–1.0)
pH, UA: 7 (ref 5.0–7.5)

## 2022-10-31 LAB — VITAMIN D 25 HYDROXY (VIT D DEFICIENCY, FRACTURES): Vit D, 25-Hydroxy: 16.4 ng/mL — ABNORMAL LOW (ref 30.0–100.0)

## 2022-10-31 LAB — MICROALBUMIN / CREATININE URINE RATIO
Creatinine, Urine: 27.1 mg/dL
Microalb/Creat Ratio: 24 mg/g creat (ref 0–29)
Microalbumin, Urine: 6.5 ug/mL

## 2022-10-31 LAB — LIPID PANEL
Chol/HDL Ratio: 4.6 ratio (ref 0.0–5.0)
Cholesterol, Total: 343 mg/dL — ABNORMAL HIGH (ref 100–199)
HDL: 75 mg/dL (ref >39)
LDL Chol Calc (NIH): 240 mg/dL — ABNORMAL HIGH (ref 0–99)
Triglycerides: 151 mg/dL — ABNORMAL HIGH (ref 0–149)
VLDL Cholesterol Cal: 28 mg/dL (ref 5–40)

## 2022-10-31 LAB — C-PEPTIDE: C-Peptide: 0.4 ng/mL — ABNORMAL LOW (ref 1.1–4.4)

## 2022-10-31 LAB — HEMOGLOBIN A1C
Est. average glucose Bld gHb Est-mCnc: 326 mg/dL
Hgb A1c MFr Bld: 13 % — ABNORMAL HIGH (ref 4.8–5.6)

## 2022-11-07 ENCOUNTER — Other Ambulatory Visit: Payer: Self-pay

## 2022-11-14 ENCOUNTER — Encounter: Payer: Self-pay | Admitting: Gerontology

## 2022-11-14 ENCOUNTER — Ambulatory Visit: Payer: Self-pay | Admitting: Gerontology

## 2022-11-14 ENCOUNTER — Other Ambulatory Visit: Payer: Self-pay

## 2022-11-14 VITALS — BP 105/70 | HR 12 | Wt 101.7 lb

## 2022-11-14 DIAGNOSIS — E559 Vitamin D deficiency, unspecified: Secondary | ICD-10-CM

## 2022-11-14 DIAGNOSIS — Z794 Long term (current) use of insulin: Secondary | ICD-10-CM

## 2022-11-14 DIAGNOSIS — E1165 Type 2 diabetes mellitus with hyperglycemia: Secondary | ICD-10-CM

## 2022-11-14 DIAGNOSIS — E781 Pure hyperglyceridemia: Secondary | ICD-10-CM

## 2022-11-14 DIAGNOSIS — E1169 Type 2 diabetes mellitus with other specified complication: Secondary | ICD-10-CM

## 2022-11-14 LAB — GLUCOSE, POCT (MANUAL RESULT ENTRY): POC Glucose: 132 mg/dl — AB (ref 70–99)

## 2022-11-14 MED ORDER — ATORVASTATIN CALCIUM 80 MG PO TABS
80.0000 mg | ORAL_TABLET | Freq: Every day | ORAL | 0 refills | Status: DC
  Filled 2022-11-14: qty 30, 30d supply, fill #0

## 2022-11-14 MED ORDER — VITAMIN D (ERGOCALCIFEROL) 1.25 MG (50000 UNIT) PO CAPS
50000.0000 [IU] | ORAL_CAPSULE | ORAL | 0 refills | Status: DC
  Filled 2022-11-14: qty 16, 112d supply, fill #0

## 2022-11-14 NOTE — Progress Notes (Signed)
Established Patient Office Visit  Subjective   Patient ID: Joe Kelly, male    DOB: Dec 17, 1990  Age: 32 y.o. MRN: [id number]  Chief Complaint  Patient presents with   Follow-up    HPI  Joe Kelly is a 32 year old male who has history of diabetes, hyperlipidemia, presents for routine follow-up visit and lab review. He no showed to his appointments for 1 year. His HgbA1c checked on 10/30/22 was 14 % his vitamin D was 16.4 ng/ml,  LDL was 240 mg/dl, total cholesterol was 343 mg/dl and Triglyceride was 151 mg/dl. He states that he checks his blood glucose bid, his reading today was 190 mg/dl, and 230 mg/dl in the evening.  His blood glucose was 132 mg/dl during visit. He endorses polydipsia, denies polyuria and polyphagia. He states that his compliant with taking his insulin. Overall, he states that he's doing well and offers no further complaint.    Review of Systems  Eyes: Negative.   Respiratory: Negative.    Cardiovascular: Negative.   Genitourinary: Negative.   Skin: Negative.   Neurological: Negative.   Endo/Heme/Allergies:  Positive for polydipsia.  Psychiatric/Behavioral: Negative.        Objective:     BP 105/70   Pulse (!) 12   Wt 101 lb 11.2 oz (46.1 kg)   SpO2 97%   BMI 19.22 kg/m  BP Readings from Last 3 Encounters:  11/14/22 105/70  10/30/22 99/66  11/29/21 109/72   Wt Readings from Last 3 Encounters:  11/14/22 101 lb 11.2 oz (46.1 kg)  10/30/22 105 lb 9.6 oz (47.9 kg)  11/29/21 111 lb 8 oz (50.6 kg)      Physical Exam HENT:     Head: Normocephalic and atraumatic.     Mouth/Throat:     Mouth: Mucous membranes are moist.  Eyes:     Pupils: Pupils are equal, round, and reactive to light.  Cardiovascular:     Rate and Rhythm: Normal rate and regular rhythm.     Pulses: Normal pulses.     Heart sounds: Normal heart sounds.  Pulmonary:     Effort: Pulmonary effort is normal.     Breath sounds: Normal breath sounds.   Skin:    General: Skin is warm.  Neurological:     General: No focal deficit present.     Mental Status: He is alert and oriented to person, place, and time. Mental status is at baseline.  Psychiatric:        Mood and Affect: Mood normal.        Behavior: Behavior normal.        Thought Content: Thought content normal.        Judgment: Judgment normal.      Results for orders placed or performed in visit on 11/14/22  POCT Glucose (CBG)  Result Value Ref Range   POC Glucose 132 (A) 70 - 99 mg/dl    Last CBC Lab Results  Component Value Date   WBC 4.1 10/30/2022   HGB 12.1 (L) 10/30/2022   HCT 36.0 (L) 10/30/2022   MCV 93 10/30/2022   MCH 31.1 10/30/2022   RDW 12.5 10/30/2022   PLT 241 [id number]   Last metabolic panel Lab Results  Component Value Date   GLUCOSE 335 (H) 10/30/2022   NA 137 10/30/2022   K 4.5 10/30/2022   CL 96 10/30/2022   CO2 26 10/30/2022   BUN 12 10/30/2022   CREATININE 0.52 (L) 10/30/2022   EGFR 136  10/30/2022   CALCIUM 9.4 10/30/2022   PHOS 2.5 03/11/2021   PROT 6.3 10/30/2022   ALBUMIN 4.2 10/30/2022   LABGLOB 2.1 10/30/2022   AGRATIO 2.0 10/30/2022   BILITOT 0.3 10/30/2022   ALKPHOS 168 (H) 10/30/2022   AST 16 10/30/2022   ALT 16 10/30/2022   ANIONGAP 7 03/11/2021   Last lipids Lab Results  Component Value Date   CHOL 343 (H) 10/30/2022   HDL 75 10/30/2022   LDLCALC 240 (H) 10/30/2022   TRIG 151 (H) 10/30/2022   CHOLHDL 4.6 10/30/2022   Last hemoglobin A1c Lab Results  Component Value Date   HGBA1C 14.0 (A) 10/30/2022   Last thyroid functions Lab Results  Component Value Date   TSH 1.934 12/05/2020   Last vitamin D Lab Results  Component Value Date   VD25OH 16.4 (L) 10/30/2022      The ASCVD Risk score (Arnett DK, et al., 2019) failed to calculate for the following reasons:   The 2019 ASCVD risk score is only valid for ages 32 to 43    Assessment & Plan:   1. Hyperlipidemia associated with type 2 diabetes  mellitus ([place]) - His LDL was 240 mg/dl, he was encouraged to start taking his 80 mg Atorvastatin, educated on medication side effects and advised to notify clinic. He was advised to continue on low fat/cholesterol diet. - atorvastatin (LIPITOR) 80 MG tablet; Take 1 tablet (80 mg total) by mouth daily.  Dispense: 30 tablet; Refill: 0  2. Type 2 diabetes mellitus with hyperglycemia, with long-term current use of insulin (HCC) - His Diabetes is not controlled, his HgbA1c was 14% and his goal should be less than 7%. He will continue on current medication, check blood glucose bid, record and bring log to follow up appointment. He was advised to continue on low carb/ non concentrated sweet diet and exercise as tolerated. - POCT Glucose (CBG); Future - POCT Glucose (CBG)  3. Vitamin D deficiency - His Vitamin D was 16.4 ng/dl, he was started on ergocalciferol, educated on medication side effects and advised to notify clinic. He was encouraged to get some sunlight. - Vitamin D, Ergocalciferol, (DRISDOL) 1.25 MG (50000 UNIT) CAPS capsule; Take 1 capsule (50,000 Units total) by mouth every 7 (seven) days.  Dispense: 16 capsule; Refill: 0     Return in about 1 month (around 12/17/2022), or if symptoms worsen or fail to improve.    Nastasha Reising Jerold Coombe, NP

## 2022-11-14 NOTE — Patient Instructions (Addendum)
Prevencin del colesterol alto Preventing High Cholesterol El colesterol es una sustancia blanca y [place], similar a la grasa, que el cuerpo humano necesita para ayudar a Librarian, academic. El hgado fabrica todo el colesterol que el cuerpo de una persona necesita. Tener el colesterol alto (hipercolesterolemia) aumenta el riesgo de sufrir enfermedades cardacas y accidentes cerebrovasculares. El exceso de colesterol o el colesterol adicional proviene de los alimentos que comemos. El colesterol alto con frecuencia puede prevenirse con cambios en la dieta y en el estilo de vida. Si ya tiene Orthoptist, puede controlarlo haciendo cambios en la dieta y en el estilo de vida, y con medicamentos. Cmo me puede afectar el colesterol alto? Si tiene Orthoptist, se pueden acumular depsitos de esta sustancia (placas) en las paredes de los vasos sanguneos. Los vasos sanguneos que transportan la sangre desde el corazn al resto del cuerpo se denominan arterias. Las Occupational hygienist y la rigidez [place] arterias. Esto a su vez puede: Limitar u obstruir el flujo sanguneo y causar la formacin de cogulos de [place]. Aumentar el riesgo de sufrir un infarto de miocardio y un accidente cerebrovascular. Qu factores pueden aumentar mi riesgo de colesterol alto? Es ms probable que Orthoptist en personas que: Consumen alimentos con alto contenido de grasa saturada o colesterol. Las grasas saturadas se encuentran principalmente en los alimentos de origen animal. Tienen sobrepeso. No hacen suficiente ejercicio fsico. Usan productos que contienen nicotina o tabaco, como cigarrillos, cigarrillos electrnicos y tabaco de Higher education careers adviser. Tienen antecedentes familiares de colesterol alto (hipercolesterolemia familiar). Qu medidas de prevencin puedo tomar? Nutricin  Coma menos grasas saturadas. Evite las grasas trans (aceites parcialmente hidrogenados). Estas suelen encontrarse  en la margarina y en algunos productos horneados, alimentos fritos y refrigerios comprados en paquete. Evite la carne precocinada o curada, como tocino, salchichas o panes de carne. Evite alimentos y bebidas que tengan azcares agregados. Consuma ms frutas, verduras y cereales integrales. Elija fuentes saludables de protenas, como pescado, carne de ave, cortes magros de carnes rojas, frijoles, guisantes, lentejas y nueces. Elija fuentes saludables de grasas, por ejemplo: Frutos secos. Aceites vegetales, en particular el aceite de oliva. Pescados que contengan grasas saludables, como los cidos grasos omega 3. Estos pescados incluyen la caballa y el salmn. Estilo de vida Baje de peso si es necesario. Mantener un ndice de masa corporal [facility]) saludable puede ayudar a prevenir o Artist. Tambin puede disminuir el riesgo de diabetes y de presin arterial alta. Pdale al mdico que lo ayude a disear un plan de dieta y ejercicio para bajar de peso de [place] segura. No consuma ningn producto que contenga nicotina o tabaco. Estos productos incluyen cigarrillos, tabaco para Higher education careers adviser y aparatos de vapeo, como los Psychologist, sport and exercise. Si necesita ayuda para dejar de consumir estos productos, consulte al [employer]. Consumo de alcohol No beba alcohol si: Su mdico le indica no hacerlo. Est embarazada, puede estar embarazada o est tratando de [place]. Si bebe alcohol: Limite la cantidad que bebe a lo siguiente: De 0 a 1 medida por da para las mujeres. De 0 a 2 medidas por da para los hombres. Sepa cunta cantidad de alcohol hay en las bebidas que toma. En los [place], una medida equivale a una botella de cerveza de 12 oz (355 ml), un vaso de vino de 5 oz (148 ml) o un vaso de una bebida alcohlica de alta graduacin de 1 oz (44 ml). Actividad  Ejerctese lo suficiente. Haga ejercicio como  se lo haya indicado el mdico. Cada semana, haga al menos 150 minutos  de ejercicio que requiera un nivel medio de esfuerzo (ejercicio de intensidad [place]). Este tipo de ejercicio: Hace que el corazn lata ms rpido, pero hace posible que pueda hablar mientras lo realiza. Puede hacerse en sesiones cortas varias veces al da o en sesiones ms largas varias veces por semana. Por ejemplo, en 5 das por semana, podra caminar rpido o andar en bicicleta 3 veces por da durante 10 minutos cada vez. Medicamentos El mdico puede recomendarle medicamentos para ayudar a Musician. Este puede ser un medicamento para reducir la cantidad de colesterol que produce el hgado. Es posible que necesite tomar medicamentos si: Los cambios en la dieta y el estilo de vida no han reducido lo suficiente el colesterol. Tiene colesterol alto y presenta otros factores de riesgo de sufrir enfermedades cardacas o accidentes cerebrovasculares. Use los medicamentos de venta libre y los recetados solamente como se lo haya indicado el mdico. Informacin general Controle los factores de riesgo del colesterol alto. Hable con el mdico acerca de todos los factores de riesgo y cmo reducir Catering manager. Controle otras afecciones que pueda tener, por ejemplo diabetes o presin arterial alta (hipertensin). Hgase anlisis de sangre para [employer] niveles de colesterol de [place] peridica, segn las indicaciones del mdico. Dallie Dad a todas las visitas de seguimiento. Esto es importante. Dnde buscar ms informacin American Heart Association (Asociacin Estadounidense del Corazn): www.heart.org National Heart, Lung, and Blood Institute ([place], los Pulmones y Herbalist): https://wilson-eaton.com/ Resumen El colesterol alto aumenta el riesgo de sufrir enfermedades cardacas y accidentes cerebrovasculares. Si mantiene el colesterol bajo, puede reducir el riesgo de tener estas afecciones. El colesterol alto con frecuencia puede prevenirse con cambios en la dieta y en el  estilo de vida. Consulte al mdico para controlar los factores de riesgo y hgase anlisis de sangre con regularidad. Esta informacin no tiene Marine scientist el consejo del mdico. Asegrese de hacerle al mdico cualquier pregunta que tenga. Document Revised: 11/03/2020 Document Reviewed: 11/03/2020 Elsevier Patient Education  Villas de carbohidratos para la diabetes mellitus en los adultos Carbohydrate Counting for Diabetes Mellitus, Adult El recuento de carbohidratos es un mtodo para llevar un registro de la cantidad de carbohidratos que se ingieren. La ingesta de carbohidratos aumenta la cantidad de azcar (glucosa) en la sangre. El recuento de la cantidad de carbohidratos que ingiere mejora el control de su glucemia. Esto, a su vez, le ayuda a controlar su diabetes. Los carbohidratos se miden en gramos (g) por porcin. Es importante saber la cantidad de carbohidratos (en gramos o por tamao de porcin) que se puede ingerir en cada comida. Esto es Psychologist, forensic. Un nutricionista puede ayudarlo a crear un plan de alimentacin y a calcular la cantidad de carbohidratos que debe ingerir en cada comida y colacin. Qu alimentos contienen carbohidratos? Los siguientes alimentos incluyen carbohidratos: Granos, como panes y cereales. Frijoles secos y productos con soja. Verduras con almidn, como papas, guisantes y maz. Lambert Mody y jugos de frutas. Leche y Estate agent. Dulces y colaciones, como pasteles, galletas, caramelos, papas fritas de bolsa y refrescos. Cmo se calculan los carbohidratos de los alimentos? Hay dos maneras de calcular los carbohidratos de los alimentos. Puede leer las etiquetas de los alimentos o aprender cules son los tamaos de las porciones estndar de los alimentos. Puede usar cualquiera de estos mtodos o una combinacin de [place]. Usar la etiqueta de informacin  nutricional La lista de Informacin nutricional est incluida en las etiquetas  de casi todas las bebidas y los alimentos envasados de los [place]. Esto incluye lo siguiente: El tamao de la porcin. Informacin sobre los nutrientes de cada porcin, incluidos los gramos de carbohidratos por porcin. Para usar la Informacin nutricional, decida cuntas porciones tomar. Luego, multiplique el nmero de porciones por la cantidad de carbohidratos por porcin. El nmero resultante es la cantidad total de carbohidratos que comer. Conocer los tamaos de las porciones estndar de los alimentos Cuando coma alimentos que contengan carbohidratos y que no estn envasados o no incluyan la informacin nutricional en la etiqueta, debe medir las porciones para poder calcular los gramos de carbohidratos. Mida los alimentos que comer con una balanza de alimentos o una taza medidora, si es necesario. Decida cuntas porciones de International aid/development worker. Multiplique el nmero de porciones por 15. En los alimentos que contienen carbohidratos, una porcin [place] a 15 g de carbohidratos. Por ejemplo, si come 2 tazas o 10 onzas (300 g) de fresas, habr comido 2 porciones y 30 g de carbohidratos (2 porciones x 15 g = 30 g). En el caso de las comidas que contienen mezclas de ms de un alimento, como las sopas y los guisos, debe calcular los carbohidratos de cada alimento que se incluye. La siguiente lista contiene los tamaos de porciones estndar de los alimentos ricos en carbohidratos ms comunes. Cada una de estas porciones tiene aproximadamente 15 g de carbohidratos: 1 rebanada de pan. 1 tortilla de seis pulgadas (15 cm). ? de taza o 2 onzas (53 g) de arroz o pasta cocidos.  taza o 3 onzas (85 g) de lentejas o frijoles cocidos o enlatados, escurridos y enjuagados.  taza o 3 onzas (85 g) de verduras con almidn, como guisantes, maz o zapallo.  taza o 4 onzas (120 g) de cereal caliente.  taza o 3 onzas (85 g) de papas hervidas o en pur, o  o 3 onzas (85 g) de una papa grande al  horno.  taza o 4 onzas fluidas (118 ml) de jugo de frutas. 1 taza u 8 onzas fluidas (237 ml) de leche. 1 unidad pequea o 4 onzas (106 g) de manzana.  unidad o 2 onzas (63 g) de una banana mediana. 1 taza o 5 oz (150 g) de fresas. 3 tazas o 1 oz (28.3 g) de palomitas de maz. Cul sera un ejemplo de recuento de carbohidratos? Para calcular los gramos de carbohidratos de este ejemplo de comida, siga los pasos que se describen a continuacin. Ejemplo de comida 3 onzas (85 g) de pechugas de pollo. ? de taza o 4 onzas (106 g) de arroz integral.  taza o 3 onzas (85 g) de maz. 1 taza u 8 onzas fluidas (237 ml) de leche. 1 taza o 5 onzas (150 g) de fresas con crema batida sin azcar. Clculo de carbohidratos Identifique los alimentos que contienen carbohidratos: Arroz. Maz. Leche. Hughie Closs. Calcule cuntas porciones come de cada alimento: 2 porciones de arroz. 1 porcin de maz. [place]. 1 porcin de fresas. Multiplique cada nmero de porciones por 15 g: 2 porciones de arroz x 15 g = 30 g. 1 porcin de maz x 15 g = 15 g. 1 porcin de leche x 15 g = 15 g. 1 porcin de fresas x 15 g = 15 g. Sume todas las cantidades para conocer el total de gramos de carbohidratos consumidos: 30 g + 15 g + 15 g +  15 g = 75 g de carbohidratos en total. Consejos para seguir este plan Al ir de compras [place] un plan de comidas y luego haga una lista de compras. Compre verduras frescas y congeladas, frutas frescas y congeladas, productos lcteos, huevos, frijoles, lentejas y cereales integrales. Fjese en las etiquetas de los alimentos. Elija los alimentos que tengan ms fibra y [name], minerals. Evite los alimentos procesados y los alimentos con Nurse, learning disability. Planificacin de las comidas Trate de consumir la misma cantidad de gramos de carbohidratos en cada comida y en cada colacin. Planifique tomar comidas y colaciones regulares y equilibradas. Dnde buscar ms  informacin [name]: diabetes.org Centers for Disease Control and Prevention (Centros para el Control y la Prevencin de [place]): StoreMirror.com.cy Academy of Nutrition and Dietetics (Academia de Nutricin y Information systems manager): Loss adjuster, chartered.org Association of Diabetes Care & Education Specialists (Asociacin de Especialistas en Atencin y Educacin sobre la Diabetes): diabeteseducator.org Resumen El recuento de carbohidratos es un mtodo para llevar un registro de la cantidad de carbohidratos que se ingieren. La ingesta de carbohidratos aumenta la cantidad de azcar (glucosa) en la sangre. El recuento de la cantidad de carbohidratos que ingiere mejora el control de su glucemia. Esto le ayuda a Chief Technology Officer su diabetes. Un nutricionista puede ayudarlo a crear un plan de alimentacin y a calcular la cantidad de carbohidratos que debe ingerir en cada comida y colacin. Esta informacin no tiene Marine scientist el consejo del mdico. Asegrese de hacerle al mdico cualquier pregunta que tenga. Document Revised: 05/09/2020 Document Reviewed: 05/09/2020 Elsevier Patient Education  [place].

## 2022-12-17 ENCOUNTER — Ambulatory Visit: Payer: Self-pay | Admitting: Gerontology

## 2022-12-17 ENCOUNTER — Encounter: Payer: Self-pay | Admitting: Gerontology

## 2022-12-17 ENCOUNTER — Other Ambulatory Visit: Payer: Self-pay

## 2022-12-17 VITALS — BP 100/67 | HR 99 | Ht 61.0 in | Wt 104.6 lb

## 2022-12-17 DIAGNOSIS — E1169 Type 2 diabetes mellitus with other specified complication: Secondary | ICD-10-CM

## 2022-12-17 DIAGNOSIS — E1165 Type 2 diabetes mellitus with hyperglycemia: Secondary | ICD-10-CM

## 2022-12-17 LAB — GLUCOSE, POCT (MANUAL RESULT ENTRY): POC Glucose: 334 mg/dl — AB (ref 70–99)

## 2022-12-17 MED ORDER — BASAGLAR KWIKPEN 100 UNIT/ML ~~LOC~~ SOPN
21.0000 [IU] | PEN_INJECTOR | Freq: Every day | SUBCUTANEOUS | 5 refills | Status: DC
  Filled 2022-12-17 – 2023-01-29 (×2): qty 15, 71d supply, fill #0

## 2022-12-17 MED ORDER — ATORVASTATIN CALCIUM 80 MG PO TABS
80.0000 mg | ORAL_TABLET | Freq: Every day | ORAL | 2 refills | Status: DC
  Filled 2022-12-17 – 2023-01-29 (×2): qty 30, 30d supply, fill #0

## 2022-12-17 NOTE — Progress Notes (Signed)
Established Patient Office Visit  Subjective   Patient ID: Joe Kelly, male    DOB: 24-Jun-1991  Age: 32 y.o. MRN: [id number]  Chief Complaint  Patient presents with   Follow-up   Diabetes    HPI Joe Kelly is a 32 year old male who has history of diabetes, hyperlipidemia, presents for routine follow-up visit.  His HgbA1c done on 10/30/22 was 14%. He checks his blood glucose twice daily, but does not have a log with him today. POCT glucose today is 334 mg/dL. He reports usual fasting blood glucose readings around 240 mg/dL and evening readings around 260 mg/dL. He thinks his lowest reading was 120 mg/dL and highest was 782 mg/dL. He admits he is sometimes inconsistent with his meal time  insulin. He reports polyphagia/polydipsia/polyuria 2-3x/week, denies hypoglycemia symptoms. Denies numbness, tingling, chest pain, vision change. He checks his feet daily. He is due for ophthalmologic exam and will see eye clinic 4/21. Foot exam today.  He restarted lipitor  QD and reports no side effects or difficulties taking it. He is working on lifestyle modifications of regular exercise plus low fat diet.  He restarted vitamin D once weekly and is out in the sun more.   Visit completed with help of [facility] [phone].   Review of Systems  Constitutional:  Negative for chills, fever and weight loss.  Eyes:  Negative for blurred vision and double vision.  Respiratory:  Negative for cough.   Cardiovascular:  Negative for chest pain and palpitations.  Gastrointestinal:  Negative for abdominal pain, nausea and vomiting.  Genitourinary:  Positive for frequency ("when my sugar is high").  Skin:  Negative for itching and rash.  Neurological:  Negative for dizziness and tingling.  Endo/Heme/Allergies:  Positive for polydipsia (2-3x/week).      Objective:     BP 100/67   Pulse 99   Ht  (1.549 m)   Wt 104 lb 9.6 oz (47.4 kg)   SpO2 97%   BMI 19.76  kg/m  BP Readings from Last 3 Encounters:  12/17/22 100/67  11/14/22 105/70  10/30/22 99/66   Wt Readings from Last 3 Encounters:  12/17/22 104 lb 9.6 oz (47.4 kg)  11/14/22 101 lb 11.2 oz (46.1 kg)  10/30/22 105 lb 9.6 oz (47.9 kg)      Physical Exam Vitals and nursing note reviewed. Exam conducted with a chaperone present.  Constitutional:      Appearance: Normal appearance.  Cardiovascular:     Rate and Rhythm: Normal rate and regular rhythm.     Pulses: Normal pulses.     Heart sounds: Normal heart sounds. No murmur heard.    No friction rub. No gallop.  Pulmonary:     Effort: Pulmonary effort is normal. No respiratory distress.     Breath sounds: Normal breath sounds. No wheezing or rhonchi.  Musculoskeletal:        General: Normal range of motion.  Skin:    General: Skin is warm and dry.     Capillary Refill: Capillary refill takes less than 2 seconds.     Findings: No lesion or rash.  Neurological:     General: No focal deficit present.     Mental Status: He is alert and oriented to person, place, and time.     Sensory: No sensory deficit.     Motor: No weakness.  Psychiatric:        Mood and Affect: Mood normal.        Behavior:  Behavior normal.        Judgment: Judgment normal.      No results found for any visits on 12/17/22.  Last CBC Lab Results  Component Value Date   WBC 4.1 10/30/2022   HGB 12.1 (L) 10/30/2022   HCT 36.0 (L) 10/30/2022   MCV 93 10/30/2022   MCH 31.1 10/30/2022   RDW 12.5 10/30/2022   PLT 241 10/30/2022   Last metabolic panel Lab Results  Component Value Date   GLUCOSE 335 (H) 10/30/2022   NA 137 10/30/2022   K 4.5 10/30/2022   CL 96 10/30/2022   CO2 26 10/30/2022   BUN 12 10/30/2022   CREATININE 0.52 (L) 10/30/2022   EGFR 136 10/30/2022   CALCIUM 9.4 10/30/2022   PHOS 2.5 03/11/2021   PROT 6.3 10/30/2022   ALBUMIN 4.2 10/30/2022   LABGLOB 2.1 10/30/2022   AGRATIO 2.0 10/30/2022   BILITOT 0.3 10/30/2022    ALKPHOS 168 (H) 10/30/2022   AST 16 10/30/2022   ALT 16 10/30/2022   ANIONGAP 7 03/11/2021   Last lipids Lab Results  Component Value Date   CHOL 343 (H) 10/30/2022   HDL 75 10/30/2022   LDLCALC 240 (H) 10/30/2022   TRIG 151 (H) 10/30/2022   CHOLHDL 4.6 10/30/2022   Last hemoglobin A1c Lab Results  Component Value Date   HGBA1C 14.0 (A) 10/30/2022   Last vitamin D Lab Results  Component Value Date   VD25OH 16.4 (L) 10/30/2022      The ASCVD Risk score (Arnett DK, et al., 2019) failed to calculate for the following reasons:   The 2019 ASCVD risk score is only valid for ages 45 to 4    Assessment & Plan:   1. Type 2 diabetes mellitus with hyperglycemia, with long-term current use of insulin - His diabetes is not controlled due to non adherence to scheduled insulin. His last HgbA1c was 14% and his goal should be less than 7%. He was encouraged to start lifestyle modifications. Increase exercise as tolerated and follow low carb/non concentrated sweetened diet.His Glargine was increased to 21 units at bedtime. - Encouraged keeping blood glucose log with TID checks and insulin admin. Educated s/s hypoglycemia/hyperglycemia. - Increase Basaglar for longer control. Follow up with A1C in 6 weeks. - POCT Glucose (CBG); Future - Insulin Glargine (BASAGLAR KWIKPEN) 100 UNIT/ML; Inject 21 Units into the skin at bedtime.  Dispense: 15 mL; Refill: 5  2. Hyperlipidemia associated with type 2 diabetes mellitus   - He will continue current medication, was encouraged to continue on low fat/low cholesterol diet with exercise as tolerated. - atorvastatin (LIPITOR) 80 MG tablet; Take 1 tablet (80 mg total) by mouth daily.  Dispense: 30 tablet; Refill: 2    Return in about 6 weeks (around 01/28/2023), or if symptoms worsen or fail to improve.    Micael Hampshire, RN

## 2022-12-17 NOTE — Patient Instructions (Signed)
Diabetes mellitus tipo 2, cuidados personales, en adultos Type 2 Diabetes Mellitus, Self-Care, Adult El cuidado personal despus del diagnstico de diabetes tipo 2 (diabetes mellitus tipo 2) implica mantener el nivel de azcar en la sangre (glucosa) bajo control a travs del equilibrio de los siguientes factores: Nutricin. Actividad fsica. Cambios en el estilo de vida. Medicamentos o insulina, si es necesario. Apoyo del equipo de mdicos y de otras personas. Cules son los riesgos? Tener diabetes tipo 2 puede ponerlo en riesgo de sufrir otras afecciones a largo plazo (crnicas), como enfermedad cardaca y enfermedad renal. El mdico puede recetarle medicamentos para evitar las complicaciones causadas por la diabetes. Cmo controlar el nivel de glucemia  Contrlese la glucemia todos los das o con la frecuencia que le haya indicado el mdico. Asegrese de controlar su A1c (hemoglobina A1c) dos o ms veces al ao, o con la frecuencia que le haya indicado el mdico. El mdico fijar objetivos de tratamiento personalizados para usted. Generalmente, el objetivo del tratamiento es mantener los siguientes niveles de glucemia: Antes de las comidas: de 80 a 130 mg/dl (de 4.4 a 7.2 mmol/l). Despus de las comidas: por debajo de 180 mg/dl (10 mmol/l). Nivel de A1c: menos del 7 %. Cmo controlar la hiperglucemia y la hipoglucemia Sntomas de hiperglucemia La hiperglucemia, tambin denominada glucemia alta, ocurre cuando la glucemia es demasiado alta. Asegrese de conocer los signos tempranos de hiperglucemia, por ejemplo: Aumento de la sed. Hambre. Mucho cansancio. Necesidad de orinar con mayor frecuencia que lo habitual. Visin borrosa. Sntomas de hipoglucemia La hipoglucemia, tambin llamada glucemia baja, ocurre cuando el nivel de glucemia es igual o menor que 70 mg/dl (3.9 mmol/l). Los medicamentos para la diabetes disminuyen la glucemia y pueden causar hipoglucemia. El riesgo de hipoglucemia  aumenta durante o despus de realizar actividad fsica, mientras duerme, cuando est enfermo o si se saltea comidas o no come durante mucho tiempo (ayuna). Es importante conocer los sntomas de la hipoglucemia y tratarla de inmediato. Lleve siempre con usted un refrigerio que contenga 15 gramos de hidratos de carbono de accin rpida para tratar la glucemia baja. Sus familiares y amigos cercanos tambin deben conocer los sntomas, y comprender cmo tratar la hipoglucemia, en caso de que usted no pueda tratarse a s mismo. Entre los sntomas, se pueden incluir los siguientes: Hambre. Ansiedad. Sudoracin y piel hmeda. Mareos o sensacin de desvanecimiento. Somnolencia. Aumento de la frecuencia cardaca. Irritabilidad. Hormigueo o adormecimiento alrededor de la boca, labios o lengua. Sueo agitado. La hipoglucemia se considera grave cuando su nivel de glucemia es igual o menor que 54 mg/dl (3 mmol/l). La hipoglucemia grave es una emergencia. No espere a ver si los sntomas desaparecen. Solicite atencin mdica de inmediato. Comunquese con el servicio de emergencias de su localidad (911 en los [place]). No conduzca por sus propios medios hasta el hospital. Si tiene hipoglucemia grave y no puede ingerir ningn alimento ni bebida, tal vez necesite glucagn. Un familiar o un amigo debe aprender a controlarle el nivel de glucemia y a darle glucagn. Pregntele al mdico si debera tener disponible un kit de glucagn de emergencia. Siga estas indicaciones en su casa: Medicamentos Adminstrese la insulina o los medicamentos para la diabetes recetados como le haya indicado el mdico. No se quede sin insulina ni cualquier otro medicamento para la diabetes. Planifique con antelacin para tenerlos siempre a su disposicin. Si usa insulina, ajuste las dosis de insulina en funcin de la cantidad de actividad fsica que realiza y de los alimentos que   consume. El mdico le indicar cmo ajustar su  dosis. Use los medicamentos de venta libre y los recetados solamente como se lo haya indicado el mdico. Comida y bebida  Las cosas que come y bebe tienen una incidencia en la glucemia y en las dosis de insulina. Hacer buenas elecciones ayuda a mantener la diabetes bajo control y a evitar otros problemas de salud. Un plan de alimentacin saludable incluye consumir protenas magras, hidratos de carbono complejos, frutas y verduras frescas, productos lcteos con bajo contenido de grasa y grasas saludables. Programe una cita con un nutricionista certificado para que la ayude a establecer un plan de alimentacin adecuado para usted. Asegrese de lo siguiente: Siga las instrucciones del mdico respecto de las restricciones en las comidas o las bebidas. Beba suficiente lquido como para mantener la orina de color amarillo plido. Mantenga un registro de los hidratos de carbono que consume. Para hacerlo, lea las etiquetas de informacin nutricional y aprenda cules son los tamaos estndares de las porciones de los alimentos. Debe tener un plan, y ajustarse a este, para los das de enfermedad cuando no pueda comer o beber como de costumbre. Elabore este plan por adelantado con el mdico.  Actividad Mantenerse activo. Realice ejercicio con regularidad como se lo haya indicado el mdico. Esto puede incluir: Hacer ejercicios de elongacin y de fortalecimiento, como yoga o levantamiento de pesas, dos veces por semana o ms. Hacer 150 minutos semanales o ms de ejercicio de intensidad moderada o alta. Esto podra incluir caminatas dinmicas, ciclismo o gimnasia acutica. Reparta la actividad en 3 das por semana o ms. No deje pasar ms de 2 das seguidos sin hacer algn tipo de actividad fsica. Cuando comience un ejercicio o una actividad nuevos, consulte con el mdico para ajustar su dosis de insulina, medicamentos o la ingesta de alimentos segn sea necesario. Estilo de vida No consuma ningn producto que  contenga nicotina o tabaco. Estos productos incluyen cigarrillos, tabaco para mascar y aparatos de vapeo, como los cigarrillos electrnicos. Si necesita ayuda para dejar de consumir estos productos, consulte al mdico. Si bebe alcohol y el mdico dice que el alcohol es seguro para usted: Limite la cantidad que bebe a lo siguiente: De 0 a 1 medida por da para las mujeres que no estn embarazadas. De 0 a 2 medidas por da para los hombres. Sepa cunta cantidad de alcohol hay en las bebidas que toma. En [place], una medida equivale a una botella de cerveza de 12 oz (355 ml), un vaso de vino de 5 oz (148 ml) o un vaso de una bebida alcohlica de alta graduacin de 1 oz (44 ml). Aprenda a manejar el estrs. Si necesita ayuda para lograrlo, consulte a su mdico. Cuide su cuerpo  Mantngase al da con las vacunas. Adems de aplicarse las vacunas como se lo haya indicado el mdico, es recomendable que se vacune contra las siguientes enfermedades: Gripe (influenza). Colquese la vacuna antigripal todos los aos. Neumona. Hepatitis B. Programe una cita para realizarse un examen ocular inmediatamente despus del diagnstico y luego una vez por ao. Examine a diario su piel y pies en busca de cortes, moretones, enrojecimiento, ampollas o llagas. Programe una cita para que el mdico le controle los pies una vez por ao. Lvese los dientes y las encas dos veces por da, y psese hilo dental una o ms veces por da. Visite al dentista una vez cada 6 meses o con ms frecuencia. Mantenga un peso saludable. Indicaciones generales Comparta su   plan de control de la diabetes con sus compaeros de trabajo y de la escuela, y con las otras personas que viven en su hogar. Lleve una tarjeta de alerta mdica o use un brazalete o medalla de alerta mdica. Concurra a todas las visitas de seguimiento. Esto es importante. Preguntas para hacerle al mdico Debo consultar con un especialista certificado en  educacin y atencin para la diabetes? Dnde puedo encontrar un grupo de apoyo para las personas con diabetes? Dnde buscar ms informacin Para obtener ayuda y orientacin, as como ms informacin sobre la diabetes, visite: American Diabetes Association (ADA) (Asociacin [place]ense de la Diabetes): www.diabetes.org American Association of Diabetes Care and Education Specialists (ADCES) (Asociacin [place]oun[place]ense de Especialistas en Atencin y Educacin sobre la Diabetes): www.diabeteseducator.org International Diabetes Federation (IDF) (Federacin Internacional de Diabetes): www.idf.org Resumen El cuidado personal despus del diagnstico de diabetes tipo 2 (diabetes mellitus tipo 2) implica mantener el nivel de azcar en la sangre (glucosa) bajo control a travs del equilibrio de la nutricin, la actividad fsica, los cambios en el estilo de vida y los medicamentos. Contrlese la glucemia todos los das, con la frecuencia que le haya indicado el mdico. Tener diabetes puede ponerlo en riesgo de sufrir otras afecciones a largo plazo (crnicas), como enfermedad cardaca y enfermedad renal. El mdico puede recetarle medicamentos para evitar las complicaciones causadas por la diabetes. Comparta su plan de control de la diabetes con sus compaeros de trabajo y de la escuela, y con las otras personas que viven en su hogar. Concurra a todas las visitas de seguimiento. Esto es importante. Esta informacin no tiene como fin reemplazar el consejo del mdico. Asegrese de hacerle al mdico cualquier pregunta que tenga. Document Revised: 03/13/2021 Document Reviewed: 02/06/2021 [employer] Patient Education  2023 [employer]  

## 2022-12-27 ENCOUNTER — Other Ambulatory Visit: Payer: Self-pay

## 2023-01-20 ENCOUNTER — Other Ambulatory Visit: Payer: Self-pay

## 2023-01-29 ENCOUNTER — Other Ambulatory Visit: Payer: Self-pay

## 2023-01-29 ENCOUNTER — Ambulatory Visit: Payer: Self-pay | Admitting: Gerontology

## 2023-01-29 ENCOUNTER — Encounter: Payer: Self-pay | Admitting: Gerontology

## 2023-01-29 VITALS — BP 101/70 | HR 96 | Temp 98.4°F | Resp 16 | Ht 61.0 in | Wt 104.4 lb

## 2023-01-29 DIAGNOSIS — E1165 Type 2 diabetes mellitus with hyperglycemia: Secondary | ICD-10-CM

## 2023-01-29 DIAGNOSIS — E1169 Type 2 diabetes mellitus with other specified complication: Secondary | ICD-10-CM

## 2023-01-29 DIAGNOSIS — E559 Vitamin D deficiency, unspecified: Secondary | ICD-10-CM

## 2023-01-29 LAB — GLUCOSE, POCT (MANUAL RESULT ENTRY): POC Glucose: 203 mg/dl — AB (ref 70–99)

## 2023-01-29 LAB — POCT GLYCOSYLATED HEMOGLOBIN (HGB A1C): Hemoglobin A1C: 13.6 % — AB (ref 4.0–5.6)

## 2023-01-29 MED ORDER — ATORVASTATIN CALCIUM 80 MG PO TABS
80.0000 mg | ORAL_TABLET | Freq: Every day | ORAL | 1 refills | Status: AC
Start: 2023-01-29 — End: ?
  Filled 2023-01-29: qty 90, 90d supply, fill #0

## 2023-01-29 MED ORDER — TECHLITE PEN NEEDLES 32G X 4 MM MISC
0 refills | Status: DC
  Filled 2023-01-29: qty 100, 25d supply, fill #0

## 2023-01-29 NOTE — Patient Instructions (Signed)
Plan de alimentacin cardiosaludable Heart-Healthy Eating Plan Muchos factores influyen en la salud cardaca, entre ellos, los hbitos de alimentacin y de ejercicio fsico. La salud del corazn tambin se denomina salud coronaria. El riesgo coronario aumenta cuando hay niveles anormales de grasa (lpidos) en la [place]. Un plan de alimentacin cardiosaludable implica limitar las grasas poco saludables, aumentar las grasas saludables, limitar el consumo de sal (sodio) y hacer otros cambios en la dieta y el estilo de [place]. En qu consiste el plan? El mdico podra recomendarle que haga lo siguiente: Que limite la ingesta de grasas al ________ % o menos del total de caloras por da. Que limite la ingesta de grasas saturadas al _________ % o menos del total de caloras por da. Que limite la cantidad de colesterol en su dieta a menos de _________ mg por da. Que limite la cantidad de sodio en su dieta a menos de _________ mg por da. Cules son algunos consejos para seguir este plan? Al cocinar Evite frer los alimentos a la hora de la coccin. Hornear, hervir, grillar y asar a la parrilla son buenas opciones. Otras formas de reducir el consumo de grasas son las siguientes: Quite la piel de las aves. Quite todas las grasas visibles de las carnes. Cocine al vapor las verduras en agua o caldo. Planificacin de las comidas  En las comidas, imagine dividir su plato en cuartos: Llene la mitad del plato con verduras y ensaladas de hojas verdes. Llene un cuarto del plato con cereales integrales. Llene un cuarto del plato con alimentos con protenas magras. Coma de 2 a 4 tazas de Copy. Una taza de verduras equivale a 1 taza (91 g) de brcoli o coliflor, 2 zanahorias medianas, 1 pimiento morrn grande, 1 batata grande, 1 tomate grande, 1 patata blanca mediana, 2 tazas (150 g) de verduras de [employer] crudas. Coma de 1 a 2 tazas de Radiation protection practitioner. Una taza de fruta equivale a 1 manzana  pequea, 1 banana grande, 1 taza (237 g) de fruta mixta, 1 naranja grande,  taza (82 g) de fruta seca, 1 taza (240 ml) de jugo de fruta al 100 %. Consuma ms alimentos con fibra soluble. Entre ellos, se incluyen las [place], el [place], las [place], los frijoles, los guisantes y Aeronautical engineer. Trate de consumir de 25 a 30 g de [employer]. Aumente el consumo de legumbres, frutos secos y semillas a 4 o 5 porciones por semana. Una porcin de frijoles secos o legumbres equivale a 1?4 taza (90 g) cocinados, 1 porcin de frutos secos equivale a  oz (12 almendras, 24 pistachos o 7 mitades de nuez) y 1 porcin de semillas equivale a  oz (8 g). Grasas Elija grasas saludables con mayor frecuencia. Elija las grasas monoinsaturadas y [address], como el aceite de oliva y canola, el aceite de [place], la semillas de [place], las nueces, las almendras y las semillas. Consuma ms grasas omega-3. Elija salmn, caballa, sardinas, atn, aceite de lino y semillas de lino molidas. Propngase comer pescado al [employer] veces por semana. Lea las etiquetas de los alimentos detenidamente para identificar los que contienen grasas trans o altas cantidades de grasas saturadas. Limite el consumo de grasas saturadas. Estas se encuentran en productos de origen animal, como carnes, mantequilla y crema. Las grasas saturadas de origen vegetal incluyen aceite de palma, de palmiste y de coco. Evite los alimentos con aceites parcialmente hidrogenados. Estos contienen grasas trans. [address] [place], se incluyen margarinas en barra, [employer]  margarinas untables, galletas dulces y saladas y otros productos horneados. Evite las comidas fritas. Informacin general Consuma ms comida casera y menos de restaurante, de bares y comida rpida. Limite o evite el alcohol. Limite los alimentos con alto contenido de International aid/development worker aadido y almidones simples, como los alimentos elaborados con harina blanca refinada (pan blanco, pasteles,  dulces). Baje de peso si es necesario. Perder solo del 5 al 10 % de su peso corporal puede ayudarlo a mejorar su estado de salud general y a Education officer, museum, como la diabetes y las enfermedades cardacas. Controle la ingesta de sodio, especialmente si tiene presin arterial alta. Hable con el mdico acerca de cambiar la ingesta de sodio. Intente incorporar ms comidas vegetarianas cada semana. Qu alimentos debo comer? Nils Pyle Nils Pyle frescas, en conserva (en su jugo natural) o frutas congeladas. Verduras Verduras frescas o congeladas (crudas, al vapor, asadas o grilladas). Ensaladas de hojas verdes. Cereales La mayora de los cereales. Elija casi siempre trigo integral y Radiation protection practitioner. Arroz y pastas, incluido el arroz integral y las pastas elaboradas con trigo integral. Armed forces operational officer y otras protenas [place] magras de res, ternera, cerdo y cordero a las que se les haya quitado la grasa visible. Pollo y pavo sin piel. Todos los pescados y [employer]. Pato salvaje, conejo, faisn y venado. Claras de huevo o sustitutos del huevo bajos en colesterol. Porotos, guisantes y lentejas secos y tofu. Semillas y la mayora de los frutos secos. Lcteos Quesos descremados y semidescremados, entre ellos, ricota y Garment/textile technologist. Leche descremada o al 1 % (lquida, en polvo o evaporada). Suero de [employer] elaborado con [employer]. Yogur descremado o semidescremado. Grasas y Writer no hidrogenadas (sin grasas trans). Aceites vegetales, incluido el de soja, ssamo, girasol, [place], [place], man, crtamo, maz, canola y semillas de algodn. Alios para ensalada o mayonesa elaborados con aceite vegetal. Halina Andreas (mineral o con gas). T y caf. T helado sin endulzar. Bebidas dietticas. Dulces y [employer], gelatina y helado de frutas. Pequeas cantidades de chocolate amargo. Limite todos los dulces y postres. Alios y [employer] y condimentos. Es posible que los  productos que se enumeran ms [place] no constituyan una lista completa de los alimentos y las bebidas que puede tomar. Consulte a un nutricionista para conocer ms opciones. Qu alimentos debo evitar? Nils Pyle Fruta enlatada en almbar espeso. Frutas con salsa de crema o mantequilla. Frutas fritas. Limite el consumo de coco. Verduras Verduras cocinadas con salsas de queso, crema o mantequilla. Verduras fritas. Cereales Panes elaborados con grasas saturadas o trans, aceites o [employer]ak. Croissants. Panecillos dulces. Rosquillas. Galletas con alto contenido de grasas, como las que galletas de queso y las papas fritas. Carnes y [address] protenas [address] grasas, como perros calientes, Penndel de res, salchichas, tocino, asado de Producer, television/film/video o [place]. Fiambres con alto contenido de [place], como salame y Loss adjuster, chartered. Caviar. Pato y ganso domsticos. Vsceras, como hgado. Lcteos Crema, crema agria, queso crema y [place] cottage con crema. Quesos enteros. Leche entera o al 2 % (lquida, evaporada o condensada). Suero de [employer]. Salsa de crema o queso con alto contenido de [place]. Yogur de East[employer]ak. Grasas y [place] de carne o materia grasa. Manteca de cacao, aceites hidrogenados, aceite de palma, aceite de coco, aceite de palmiste. Grasas y [address] grasas slidas, incluida la grasa del tocino, el cerdo [place], la [place] de cerdo y Civil engineer, contracting. Sustitutos no lcteos de la crema. Aderezos para ensalada con queso o crema agria. Bebidas Refrescos regulares y cualquier bebida  con agregado de azcar. Dulces y [employer]. Pudin. Galletas dulces. Bizcochuelos. Pasteles. Chocolate con leche o chocolate blanco. Almbares con mantequilla. Helados o bebidas elaboradas con helado con alto contenido de grasas. Es posible que los productos que se enumeran ms arriba no sean una lista completa de los alimentos y las bebidas que se Theatre stage manager. Consulte a un nutricionista para obtener ms  informacin. Resumen La planificacin de comidas cardiosaludables implica limitar las grasas poco saludables, aumentar las grasas saludables, limitar el consumo de sal (sodio) y Radio producer otros cambios en la dieta y el estilo de [place]. Baje de peso si es necesario. Perder solo del 5 al 10 % de su peso corporal puede ayudarlo a mejorar su estado de salud general y a Education officer, museum, como la diabetes y las enfermedades cardacas. Propngase seguir una dieta equilibrada, que incluya frutas y verduras, productos lcteos descremados o semidescremados, protenas magras, frutos secos y legumbres, cereales integrales y aceites y grasas cardiosaludables. Esta informacin no tiene Theme park manager el consejo del mdico. Asegrese de hacerle al mdico cualquier pregunta que tenga. Document Revised: 10/10/2021 Document Reviewed: 10/10/2021 Elsevier Patient Education  2024 Elsevier Inc. Recuento de carbohidratos para la diabetes mellitus en los adultos Carbohydrate Counting for Diabetes Mellitus, Adult El recuento de carbohidratos es un mtodo para llevar un registro de la cantidad de carbohidratos que se ingieren. La ingesta de carbohidratos aumenta la cantidad de azcar (glucosa) en la sangre. El recuento de la cantidad de carbohidratos que ingiere mejora el control de su glucemia. Esto, a su vez, le ayuda a controlar su diabetes. Los carbohidratos se miden en gramos (g) por porcin. Es importante saber la cantidad de carbohidratos (en gramos o por tamao de porcin) que se puede ingerir en cada comida. Esto es [name]. Un nutricionista puede ayudarlo a crear un plan de alimentacin y a calcular la cantidad de carbohidratos que debe ingerir en cada comida y colacin. Qu alimentos contienen carbohidratos? Los siguientes alimentos incluyen carbohidratos: Granos, como panes y cereales. Frijoles secos y productos con soja. Verduras con almidn, como papas, guisantes y maz. Nils Pyle y jugos  de frutas. Leche y Dentist. Dulces y colaciones, como pasteles, galletas, caramelos, papas fritas de bolsa y refrescos. Cmo se calculan los carbohidratos de los alimentos? Hay dos maneras de calcular los carbohidratos de los alimentos. Puede leer las etiquetas de los alimentos o aprender cules son los tamaos de las porciones estndar de los alimentos. Puede usar cualquiera de estos mtodos o una combinacin de [place]. Usar la [name] La lista de Informacin nutricional est incluida en las etiquetas de casi todas las bebidas y los alimentos envasados de los [place]. Esto incluye lo siguiente: El tamao de la porcin. Informacin sobre los nutrientes de cada porcin, incluidos los gramos de carbohidratos por porcin. Para usar la Informacin nutricional, decida cuntas porciones tomar. Luego, multiplique el nmero de porciones por la cantidad de carbohidratos por porcin. El nmero resultante es la cantidad total de carbohidratos que comer. Conocer los tamaos de las porciones estndar de los alimentos Cuando coma alimentos que contengan carbohidratos y que no estn envasados o no incluyan la informacin nutricional en la etiqueta, debe medir las porciones para poder calcular los gramos de carbohidratos. Mida los alimentos que comer con una balanza de alimentos o una taza medidora, si es necesario. Decida cuntas porciones de [name], systems. Multiplique el nmero de porciones por 15. En los alimentos que contienen carbohidratos, una porcin [place] a 15  g de carbohidratos. Por ejemplo, si come 2 tazas o 10 onzas (300 g) de fresas, habr comido 2 porciones y 30 g de carbohidratos (2 porciones x 15 g = 30 g). En el caso de las comidas que contienen mezclas de ms de un alimento, como las sopas y los guisos, debe calcular los carbohidratos de cada alimento que se incluye. La siguiente lista contiene los tamaos de porciones estndar de los alimentos ricos  en carbohidratos ms comunes. Cada una de estas porciones tiene aproximadamente 15 g de carbohidratos: 1 rebanada de pan. 1 tortilla de seis pulgadas (15 cm). ? de taza o 2 onzas (53 g) de arroz o pasta cocidos.  taza o 3 onzas (85 g) de lentejas o frijoles cocidos o enlatados, escurridos y enjuagados.  taza o 3 onzas (85 g) de verduras con almidn, como guisantes, maz o zapallo.  taza o 4 onzas (120 g) de cereal caliente.  taza o 3 onzas (85 g) de papas hervidas o en pur, o  o 3 onzas (85 g) de una papa grande al horno.  taza o 4 onzas fluidas (118 ml) de jugo de frutas. 1 taza u 8 onzas fluidas (237 ml) de leche. 1 unidad pequea o 4 onzas (106 g) de manzana.  unidad o 2 onzas (63 g) de una banana mediana. 1 taza o 5 oz (150 g) de fresas. 3 tazas o 1 oz (28.3 g) de palomitas de maz. Cul sera un ejemplo de recuento de carbohidratos? Para calcular los gramos de carbohidratos de este ejemplo de comida, siga los pasos que se describen a continuacin. Ejemplo de comida 3 onzas (85 g) de pechugas de pollo. ? de taza o 4 onzas (106 g) de arroz integral.  taza o 3 onzas (85 g) de maz. 1 taza u 8 onzas fluidas (237 ml) de leche. 1 taza o 5 onzas (150 g) de fresas con crema batida sin azcar. Clculo de carbohidratos Identifique los alimentos que contienen carbohidratos: Arroz. Maz. Leche. Jinny Sanders. Calcule cuntas porciones come de cada alimento: 2 porciones de arroz. 1 porcin de maz. 1 porcin de leche. 1 porcin de fresas. Multiplique cada nmero de porciones por 15 g: 2 porciones de arroz x 15 g = 30 g. 1 porcin de maz x 15 g = 15 g. 1 porcin de leche x 15 g = 15 g. 1 porcin de fresas x 15 g = 15 g. Sume todas las cantidades para conocer el total de gramos de carbohidratos consumidos: 30 g + 15 g + 15 g + 15 g = 75 g de carbohidratos en total. Consejos para seguir este plan Al ir de compras [place] un plan de comidas y luego haga una lista de compras. Compre  verduras frescas y congeladas, frutas frescas y congeladas, productos lcteos, huevos, frijoles, lentejas y cereales integrales. Fjese en las etiquetas de los alimentos. Elija los alimentos que tengan ms fibra y Neurosurgeon. Evite los alimentos procesados y los alimentos con [name], clinical. Planificacin de las comidas Trate de consumir la misma cantidad de gramos de carbohidratos en cada comida y en cada colacin. Planifique tomar comidas y colaciones regulares y equilibradas. Dnde buscar ms informacin [name]: diabetes.org Centers for Disease Control and Prevention (Centros para el Control y la Prevencin de [place]): TonerPromos.no Academy of Nutrition and Dietetics (Academia de Nutricin y Pension scheme manager): Theme park manager.org Association of Diabetes Care & Education Specialists (Asociacin de Especialistas en Atencin y Educacin sobre la Diabetes): diabeteseducator.org Resumen El  recuento de carbohidratos es un mtodo para llevar un registro de la cantidad de carbohidratos que se ingieren. La ingesta de carbohidratos aumenta la cantidad de azcar (glucosa) en la sangre. El recuento de la cantidad de carbohidratos que ingiere mejora el control de su glucemia. Esto le ayuda a Chief Operating Officer su diabetes. Un nutricionista puede ayudarlo a crear un plan de alimentacin y a calcular la cantidad de carbohidratos que debe ingerir en cada comida y colacin. Esta informacin no tiene Theme park manager el consejo del mdico. Asegrese de hacerle al mdico cualquier pregunta que tenga. Document Revised: 05/09/2020 Document Reviewed: 05/09/2020 Elsevier Patient Education  2024 [employer].

## 2023-01-29 NOTE — Progress Notes (Signed)
Established Patient Office Visit  Subjective   Patient ID: Joe Kelly, male    DOB: 09-27-1990  Age: 32 y.o. MRN: [id number]  Chief Complaint  Patient presents with   Follow-up   Diabetes    Patient has been out of Humalog since 01/25/23    HPI   Joe Kelly is a 32 year old male who has history of diabetes, hyperlipidemia, presents for routine follow-up visit and lab review. His HgbA1c checked during visit decreased from 14% to 13.6% and blood glucose was 203 mg/dl. He erports being out of his medications, and didn't call pharmacy for refill. He checks his blood glucose bid, his states that his fasting readings are usually 180's and  evening readings are in the 160's. He denies hypo/hyperglycemic symptoms, peripheral neuropathy and performs daily foot checks. He miss the Ophthalmic eye exam. Overall, he states that he's doing well and offers no further complaint.  Review of Systems  Eyes: Negative.   Respiratory: Negative.    Cardiovascular: Negative.   Genitourinary: Negative.   Neurological: Negative.   Endo/Heme/Allergies: Negative.   Psychiatric/Behavioral: Negative.        Objective:     BP 101/70 (BP Location: Right Arm, Patient Position: Sitting, Cuff Size: Normal)   Pulse 96   Temp 98.4 F (36.9 C) (Oral)   Resp 16   Ht 5\' 1"  (1.549 m)   Wt 104 lb 6.4 oz (47.4 kg)   SpO2 96%   BMI 19.73 kg/m  BP Readings from Last 3 Encounters:  01/29/23 101/70  12/17/22 100/67  11/14/22 105/70   Wt Readings from Last 3 Encounters:  01/29/23 104 lb 6.4 oz (47.4 kg)  12/17/22 104 lb 9.6 oz (47.4 kg)  11/14/22 101 lb 11.2 oz (46.1 kg)      Physical Exam Constitutional:      Appearance: Normal appearance.  HENT:     Mouth/Throat:     Mouth: Mucous membranes are moist.  Eyes:     Extraocular Movements: Extraocular movements intact.     Conjunctiva/sclera: Conjunctivae normal.     Pupils: Pupils are equal, round, and reactive to light.   Cardiovascular:     Rate and Rhythm: Normal rate and regular rhythm.     Pulses: Normal pulses.     Heart sounds: Normal heart sounds.  Pulmonary:     Effort: Pulmonary effort is normal.     Breath sounds: Normal breath sounds.  Skin:    General: Skin is warm.  Neurological:     General: No focal deficit present.     Mental Status: He is alert and oriented to person, place, and time. Mental status is at baseline.  Psychiatric:        Mood and Affect: Mood normal.        Behavior: Behavior normal.        Thought Content: Thought content normal.        Judgment: Judgment normal.      Results for orders placed or performed in visit on 01/29/23  POCT Glucose (CBG)  Result Value Ref Range   POC Glucose 203 (A) 70 - 99 mg/dl  POCT HgB N8G  Result Value Ref Range   Hemoglobin A1C 13.6 (A) 4.0 - 5.6 %   HbA1c POC (<> result, manual entry)     HbA1c, POC (prediabetic range)     HbA1c, POC (controlled diabetic range)      Last CBC Lab Results  Component Value Date   WBC 4.1 10/30/2022  HGB 12.1 (L) 10/30/2022   HCT 36.0 (L) 10/30/2022   MCV 93 10/30/2022   MCH 31.1 10/30/2022   RDW 12.5 10/30/2022   PLT 241 10/30/2022   Last metabolic panel Lab Results  Component Value Date   GLUCOSE 335 (H) 10/30/2022   NA 137 10/30/2022   K 4.5 10/30/2022   CL 96 10/30/2022   CO2 26 10/30/2022   BUN 12 10/30/2022   CREATININE 0.52 (L) 10/30/2022   EGFR 136 10/30/2022   CALCIUM 9.4 10/30/2022   PHOS 2.5 03/11/2021   PROT 6.3 10/30/2022   ALBUMIN 4.2 10/30/2022   LABGLOB 2.1 10/30/2022   AGRATIO 2.0 10/30/2022   BILITOT 0.3 10/30/2022   ALKPHOS 168 (H) 10/30/2022   AST 16 10/30/2022   ALT 16 10/30/2022   ANIONGAP 7 03/11/2021   Last lipids Lab Results  Component Value Date   CHOL 343 (H) 10/30/2022   HDL 75 10/30/2022   LDLCALC 240 (H) 10/30/2022   TRIG 151 (H) 10/30/2022   CHOLHDL 4.6 10/30/2022   Last hemoglobin A1c Lab Results  Component Value Date   HGBA1C  13.6 (A) 01/29/2023   Last thyroid functions Lab Results  Component Value Date   TSH 1.934 12/05/2020   Last vitamin D Lab Results  Component Value Date   VD25OH 16.4 (L) 10/30/2022   Last vitamin B12 and Folate Lab Results  Component Value Date   VITAMINB12 798 03/28/2021   FOLATE 16.9 03/28/2021      The ASCVD Risk score (Arnett DK, et al., 2019) failed to calculate for the following reasons:   The 2019 ASCVD risk score is only valid for ages 98 to 72    Assessment & Plan:   1. Type 2 diabetes mellitus with hyperglycemia, with long-term current use of insulin (HCC) - His diabetes is not under control, his HgbA1c goal should be less than 7%. He will continue on current medication, notify Pharmacy for refills, low carb/non concentrated sweet diet and follow [facility] with [facility]. - POCT Glucose (CBG) - POCT HgB A1C  2. Hyperlipidemia associated with type 2 diabetes mellitus (HCC) - His LDL was 240 mg/dl, will recheck lipid panel. He will continue on current medication, low fat/cholesterol diet. - atorvastatin (LIPITOR) 80 MG tablet; Take 1 tablet (80 mg total) by mouth daily.  Dispense: 90 tablet; Refill: 1 - Lipid panel; Future - Lipid panel  3. Vitamin D deficiency - He completed dose of Ergocalciferol, will recheck lab. He was encouraged to get some sunlight. - Vitamin D (25 hydroxy); Future - Vitamin D (25 hydroxy)    Return in about 13 weeks (around 04/30/2023), or if symptoms worsen or fail to improve.    Lissie Hinesley Trellis Paganini, NP

## 2023-01-29 NOTE — Progress Notes (Signed)
[employer] language services used for today's visit, id# [phone]

## 2023-01-30 ENCOUNTER — Other Ambulatory Visit: Payer: Self-pay

## 2023-01-30 ENCOUNTER — Other Ambulatory Visit: Payer: Self-pay | Admitting: Gerontology

## 2023-01-30 DIAGNOSIS — E1165 Type 2 diabetes mellitus with hyperglycemia: Secondary | ICD-10-CM

## 2023-01-30 DIAGNOSIS — E559 Vitamin D deficiency, unspecified: Secondary | ICD-10-CM

## 2023-01-30 DIAGNOSIS — Z794 Long term (current) use of insulin: Secondary | ICD-10-CM

## 2023-01-30 LAB — LIPID PANEL
Chol/HDL Ratio: 6.3 ratio — ABNORMAL HIGH (ref 0.0–5.0)
Cholesterol, Total: 284 mg/dL — ABNORMAL HIGH (ref 100–199)
HDL: 45 mg/dL (ref >39)
LDL Chol Calc (NIH): 159 mg/dL — ABNORMAL HIGH (ref 0–99)
Triglycerides: 416 mg/dL — ABNORMAL HIGH (ref 0–149)
VLDL Cholesterol Cal: 80 mg/dL — ABNORMAL HIGH (ref 5–40)

## 2023-01-30 LAB — VITAMIN D 25 HYDROXY (VIT D DEFICIENCY, FRACTURES): Vit D, 25-Hydroxy: 13.4 ng/mL — ABNORMAL LOW (ref 30.0–100.0)

## 2023-01-30 MED ORDER — VITAMIN D (ERGOCALCIFEROL) 1.25 MG (50000 UNIT) PO CAPS
50000.0000 [IU] | ORAL_CAPSULE | ORAL | 0 refills | Status: DC
Start: 2023-01-30 — End: 2023-04-30
  Filled 2023-01-30: qty 16, 112d supply, fill #0

## 2023-01-30 MED ORDER — TECHLITE PEN NEEDLES 32G X 4 MM MISC
0 refills | Status: AC
Start: 2023-01-30 — End: ?
  Filled 2023-01-30: qty 100, fill #0
  Filled 2024-01-12: qty 100, 50d supply, fill #0

## 2023-02-05 ENCOUNTER — Ambulatory Visit: Payer: Self-pay

## 2023-02-05 ENCOUNTER — Ambulatory Visit (LOCAL_COMMUNITY_HEALTH_CENTER): Payer: Self-pay

## 2023-02-05 DIAGNOSIS — Z23 Encounter for immunization: Secondary | ICD-10-CM

## 2023-02-05 DIAGNOSIS — Z719 Counseling, unspecified: Secondary | ICD-10-CM

## 2023-02-05 NOTE — Progress Notes (Signed)
Pt seen in nurse clinic for vaccines required for Immigration. Pt requested Covid 19, Hep B, MMR, and Varicella. Updated Epic record and childhood vaccine record from [place] into [place] and reviewed. Pt is uninsured, pt paid Hep B, MMR and Varicella, use Bridge program for [employer] vaccine. Administered vaccines, monitored for 15 min, tolerated well. Given VIS and NCIR copies, explained and understood. Spanish interpretation was provided by Marlene Yemen. M.Brenee Gajda, LPN.

## 2023-03-13 ENCOUNTER — Ambulatory Visit: Payer: Self-pay

## 2023-03-20 ENCOUNTER — Ambulatory Visit: Payer: Self-pay

## 2023-04-24 ENCOUNTER — Other Ambulatory Visit: Payer: Self-pay

## 2023-04-30 ENCOUNTER — Other Ambulatory Visit: Payer: Self-pay

## 2023-04-30 ENCOUNTER — Encounter: Payer: Self-pay | Admitting: Gerontology

## 2023-04-30 ENCOUNTER — Ambulatory Visit: Payer: Self-pay | Admitting: Gerontology

## 2023-04-30 VITALS — BP 100/67 | HR 93 | Ht 61.5 in | Wt 104.5 lb

## 2023-04-30 DIAGNOSIS — E1169 Type 2 diabetes mellitus with other specified complication: Secondary | ICD-10-CM

## 2023-04-30 DIAGNOSIS — Z794 Long term (current) use of insulin: Secondary | ICD-10-CM

## 2023-04-30 DIAGNOSIS — E785 Hyperlipidemia, unspecified: Secondary | ICD-10-CM

## 2023-04-30 DIAGNOSIS — E559 Vitamin D deficiency, unspecified: Secondary | ICD-10-CM

## 2023-04-30 LAB — POCT GLYCOSYLATED HEMOGLOBIN (HGB A1C): Hemoglobin A1C: 12.7 % — AB (ref 4.0–5.6)

## 2023-04-30 LAB — GLUCOSE, POCT (MANUAL RESULT ENTRY): POC Glucose: 162 mg/dl — AB (ref 70–99)

## 2023-04-30 MED ORDER — BASAGLAR KWIKPEN 100 UNIT/ML ~~LOC~~ SOPN
14.0000 [IU] | PEN_INJECTOR | Freq: Every day | SUBCUTANEOUS | 5 refills | Status: DC
Start: 2023-04-30 — End: 2023-07-01
  Filled 2023-04-30: qty 15, 107d supply, fill #0

## 2023-04-30 MED ORDER — VITAMIN D (ERGOCALCIFEROL) 1.25 MG (50000 UNIT) PO CAPS
50000.0000 [IU] | ORAL_CAPSULE | ORAL | 0 refills | Status: DC
Start: 2023-04-30 — End: 2023-07-03
  Filled 2023-04-30: qty 12, 84d supply, fill #0

## 2023-04-30 NOTE — Progress Notes (Signed)
Established Patient Office Visit  Subjective   Patient ID: Joe Kelly, male    DOB: 07-05-1991  Age: 32 y.o. MRN: [id number]  Chief Complaint  Patient presents with   Follow-up    HPI  Joe Kelly is a 32 year old male who has history of diabetes, hyperlipidemia, presents for routine follow-up visit and lab review. He states that he's compliant with his medications, denies side effects, but does not adhere to ADA diet. His HgbA1c checked during visit decreased from 13.6% to 12.7%. He checks his blood glucose bid, and this morning was 160 mg/dl, and it was 409 mg/dl during visit. He denies hypoglycemic symptoms, but endorses hyperglycemic symptoms, denies peripheral neuropathy and performs daily foot checks. His Vitamin D checked on 01/29/23 was 13.4 ng/ml, but didn't pick up Ergocalciferol from Pharmacy. His total cholesterol decreased from 343 mg/dl to 811 mg/dl, Triglycerides increased from 151 mg/dl, to 914 mg/dl, and his LDL decreased from 240 mg/dl to 782 mg/dl. He denies chest pain, palpitation, light headedness and vision changes. Overall, he states that he's doing well and offers no further complaint.   Patient Active Problem List   Diagnosis Date Noted   Vitamin D deficiency 11/14/2022   Shortness of breath    Hypomagnesemia    Hyperlipidemia    Leukocytosis    Type 2 diabetes mellitus with hyperglycemia (HCC) 01/23/2021   Hyperlipidemia associated with type 2 diabetes mellitus (HCC) 01/23/2021   Hypokalemia    DKA (diabetic ketoacidosis) (HCC) 12/05/2020   Hyponatremia 12/05/2020   Dehydration 12/05/2020   Past Medical History:  Diagnosis Date   Diabetes mellitus without complication (HCC)    Hyperlipidemia    Past Surgical History:  Procedure Laterality Date   NO PAST SURGERIES     Social History   Tobacco Use   Smoking status: Former    Current packs/day: 0.00    Types: Cigarettes    Quit date: 12/2020    Years since quitting:  2.4   Smokeless tobacco: Never  Vaping Use   Vaping status: Never Used  Substance Use Topics   Alcohol use: Not Currently    Comment: last use 12 years ago   Drug use: Not Currently    Types: Marijuana    Comment: quit 2 years ago   Family History  Problem Relation Age of Onset   Diabetes Mother    Healthy Father    Allergies  Allergen Reactions   Coconut Flavor     Dizziness       Review of Systems  Constitutional: Negative.   Eyes: Negative.   Respiratory: Negative.    Cardiovascular: Negative.   Skin: Negative.   Neurological: Negative.   Endo/Heme/Allergies:  Positive for polydipsia.  Psychiatric/Behavioral: Negative.        Objective:     BP 100/67   Pulse 93   Ht 5' 1.5" (1.562 m)   Wt 104 lb 8 oz (47.4 kg)   SpO2 96%   BMI 19.43 kg/m  BP Readings from Last 3 Encounters:  04/30/23 100/67  01/29/23 101/70  12/17/22 100/67   Wt Readings from Last 3 Encounters:  04/30/23 104 lb 8 oz (47.4 kg)  01/29/23 104 lb 6.4 oz (47.4 kg)  12/17/22 104 lb 9.6 oz (47.4 kg)      Physical Exam HENT:     Head: Normocephalic and atraumatic.     Mouth/Throat:     Mouth: Mucous membranes are moist.  Eyes:     Pupils: Pupils are  equal, round, and reactive to light.  Cardiovascular:     Rate and Rhythm: Normal rate and regular rhythm.     Pulses: Normal pulses.     Heart sounds: Normal heart sounds.  Pulmonary:     Effort: Pulmonary effort is normal.     Breath sounds: Normal breath sounds.  Skin:    General: Skin is warm.  Neurological:     General: No focal deficit present.     Mental Status: He is alert and oriented to person, place, and time. Mental status is at baseline.  Psychiatric:        Mood and Affect: Mood normal.        Behavior: Behavior normal.        Thought Content: Thought content normal.        Judgment: Judgment normal.      Results for orders placed or performed in visit on 04/30/23  POCT Glucose (CBG)  Result Value Ref Range    POC Glucose 162 (A) 70 - 99 mg/dl  POCT HgB Q6V  Result Value Ref Range   Hemoglobin A1C 12.7 (A) 4.0 - 5.6 %   HbA1c POC (<> result, manual entry)     HbA1c, POC (prediabetic range)     HbA1c, POC (controlled diabetic range)      Last CBC Lab Results  Component Value Date   WBC 4.1 10/30/2022   HGB 12.1 (L) 10/30/2022   HCT 36.0 (L) 10/30/2022   MCV 93 10/30/2022   MCH 31.1 10/30/2022   RDW 12.5 10/30/2022   PLT 241 10/30/2022   Last metabolic panel Lab Results  Component Value Date   GLUCOSE 335 (H) 10/30/2022   NA 137 10/30/2022   K 4.5 10/30/2022   CL 96 10/30/2022   CO2 26 10/30/2022   BUN 12 10/30/2022   CREATININE 0.52 (L) 10/30/2022   EGFR 136 10/30/2022   CALCIUM 9.4 10/30/2022   PHOS 2.5 03/11/2021   PROT 6.3 10/30/2022   ALBUMIN 4.2 10/30/2022   LABGLOB 2.1 10/30/2022   AGRATIO 2.0 10/30/2022   BILITOT 0.3 10/30/2022   ALKPHOS 168 (H) 10/30/2022   AST 16 10/30/2022   ALT 16 10/30/2022   ANIONGAP 7 03/11/2021   Last lipids Lab Results  Component Value Date   CHOL 284 (H) 01/29/2023   HDL 45 01/29/2023   LDLCALC 159 (H) 01/29/2023   TRIG 416 (H) 01/29/2023   CHOLHDL 6.3 (H) 01/29/2023   Last hemoglobin A1c Lab Results  Component Value Date   HGBA1C 12.7 (A) 04/30/2023   Last thyroid functions Lab Results  Component Value Date   TSH 1.934 12/05/2020   Last vitamin D Lab Results  Component Value Date   VD25OH 13.4 (L) 01/29/2023   Last vitamin B12 and Folate Lab Results  Component Value Date   VITAMINB12 798 03/28/2021   FOLATE 16.9 03/28/2021      The ASCVD Risk score (Arnett DK, et al., 2019) failed to calculate for the following reasons:   The 2019 ASCVD risk score is only valid for ages 71 to 52    Assessment & Plan:   1. Type 2 diabetes mellitus with hyperglycemia, with long-term current use of insulin (HCC) - His diabetes is not controlled due to noncompliance with his diet.  His hemoglobin A1c was 12.7% (goal should be  less than 7%.  He will continue and bring log to follow-up appointment medication, low carbohydrate/non concentrated sweet diet and exercise as tolerated.  He was advised  to check his blood glucose,record and bring log to follow up appointment. - POCT HgB A1C; Future - POCT Glucose (CBG); Future - POCT Glucose (CBG) - POCT HgB A1C - Insulin Glargine (BASAGLAR KWIKPEN) 100 UNIT/ML; Inject 14 Units into the skin at bedtime.  Dispense: 15 mL; Refill: 5  2. Vitamin D deficiency -Vitamin D was 13.4 ng/ml, he was started on 50,000 units of  Ergocalciferol, was educated on medication side effects and advised to notify clinic.  He was encouraged to get some sunlight. - Vitamin D, Ergocalciferol, (DRISDOL) 1.25 MG (50000 UNIT) CAPS capsule; Take 1 capsule (50,000 Units total) by mouth every 7 (seven) days.  Dispense: 16 capsule; Refill: 0  3. Hyperlipidemia associated with type 2 diabetes mellitus (HCC) -He will continue on current medication, low-fat and cholesterol diet.     Return in about 27 days (around 05/27/2023), or if symptoms worsen or fail to improve.    Brielynn Sekula Trellis Paganini, NP

## 2023-04-30 NOTE — Patient Instructions (Signed)
Plan de alimentacin cardiosaludable Heart-Healthy Eating Plan Muchos factores influyen en la salud cardaca, entre ellos, los hbitos de alimentacin y de ejercicio fsico. La salud del corazn tambin se denomina salud coronaria. El riesgo coronario aumenta cuando hay niveles anormales de grasa (lpidos) en la [place]. Un plan de alimentacin cardiosaludable implica limitar las grasas poco saludables, aumentar las grasas saludables, limitar el consumo de sal (sodio) y hacer otros cambios en la dieta y el estilo de [place]. En qu consiste el plan? El mdico podra recomendarle que haga lo siguiente: Que limite la ingesta de grasas al ________ % o menos del total de caloras por da. Que limite la ingesta de grasas saturadas al _________ % o menos del total de caloras por da. Que limite la cantidad de colesterol en su dieta a menos de _________ mg por da. Que limite la cantidad de sodio en su dieta a menos de _________ mg por da. Cules son algunos consejos para seguir este plan? Al cocinar Evite frer los alimentos a la hora de la coccin. Hornear, hervir, grillar y asar a la parrilla son buenas opciones. Otras formas de reducir el consumo de grasas son las siguientes: Quite la piel de las aves. Quite todas las grasas visibles de las carnes. Cocine al vapor las verduras en agua o caldo. Planificacin de las comidas  En las comidas, imagine dividir su plato en cuartos: Llene la mitad del plato con verduras y ensaladas de hojas verdes. Llene un cuarto del plato con cereales integrales. Llene un cuarto del plato con alimentos con protenas magras. Coma de 2 a 4 tazas de Copy. Una taza de verduras equivale a 1 taza (91 g) de brcoli o coliflor, 2 zanahorias medianas, 1 pimiento morrn grande, 1 batata grande, 1 tomate grande, 1 patata blanca mediana, 2 tazas (150 g) de verduras de [employer] crudas. Coma de 1 a 2 tazas de Radiation protection practitioner. Una taza de fruta equivale a 1 manzana  pequea, 1 banana grande, 1 taza (237 g) de fruta mixta, 1 naranja grande,  taza (82 g) de fruta seca, 1 taza (240 ml) de jugo de fruta al 100 %. Consuma ms alimentos con fibra soluble. Entre ellos, se incluyen las [place], el [place], las [place], los frijoles, los guisantes y Aeronautical engineer. Trate de consumir de 25 a 30 g de [employer]. Aumente el consumo de legumbres, frutos secos y semillas a 4 o 5 porciones por semana. Una porcin de frijoles secos o legumbres equivale a 1?4 taza (90 g) cocinados, 1 porcin de frutos secos equivale a  oz (12 almendras, 24 pistachos o 7 mitades de nuez) y 1 porcin de semillas equivale a  oz (8 g). Grasas Elija grasas saludables con mayor frecuencia. Elija las grasas monoinsaturadas y [address], como el aceite de oliva y canola, el aceite de [place], la semillas de [place], las nueces, las almendras y las semillas. Consuma ms grasas omega-3. Elija salmn, caballa, sardinas, atn, aceite de lino y semillas de lino molidas. Propngase comer pescado al [employer] veces por semana. Lea las etiquetas de los alimentos detenidamente para identificar los que contienen grasas trans o altas cantidades de grasas saturadas. Limite el consumo de grasas saturadas. Estas se encuentran en productos de origen animal, como carnes, mantequilla y crema. Las grasas saturadas de origen vegetal incluyen aceite de palma, de palmiste y de coco. Evite los alimentos con aceites parcialmente hidrogenados. Estos contienen grasas trans. [address] [place], se incluyen margarinas en barra, [employer]  margarinas untables, galletas dulces y saladas y otros productos horneados. Evite las comidas fritas. Informacin general Consuma ms comida casera y menos de restaurante, de bares y comida rpida. Limite o evite el alcohol. Limite los alimentos con alto contenido de International aid/development worker aadido y almidones simples, como los alimentos elaborados con harina blanca refinada (pan blanco, pasteles,  dulces). Baje de peso si es necesario. Perder solo del 5 al 10 % de su peso corporal puede ayudarlo a mejorar su estado de salud general y a Education officer, museum, como la diabetes y las enfermedades cardacas. Controle la ingesta de sodio, especialmente si tiene presin arterial alta. Hable con el mdico acerca de cambiar la ingesta de sodio. Intente incorporar ms comidas vegetarianas cada semana. Qu alimentos debo comer? Nils Pyle Nils Pyle frescas, en conserva (en su jugo natural) o frutas congeladas. Verduras Verduras frescas o congeladas (crudas, al vapor, asadas o grilladas). Ensaladas de hojas verdes. Cereales La mayora de los cereales. Elija casi siempre trigo integral y Radiation protection practitioner. Arroz y pastas, incluido el arroz integral y las pastas elaboradas con trigo integral. Armed forces operational officer y otras protenas [place] magras de res, ternera, cerdo y cordero a las que se les haya quitado la grasa visible. Pollo y pavo sin piel. Todos los pescados y [employer]. Pato salvaje, conejo, faisn y venado. Claras de huevo o sustitutos del huevo bajos en colesterol. Porotos, guisantes y lentejas secos y tofu. Semillas y la mayora de los frutos secos. Lcteos Quesos descremados y semidescremados, entre ellos, ricota y Garment/textile technologist. Leche descremada o al 1 % (lquida, en polvo o evaporada). Suero de [employer] elaborado con [employer]. Yogur descremado o semidescremado. Grasas y Writer no hidrogenadas (sin grasas trans). Aceites vegetales, incluido el de soja, ssamo, girasol, [place], [place], man, crtamo, maz, canola y semillas de algodn. Alios para ensalada o mayonesa elaborados con aceite vegetal. Halina Andreas (mineral o con gas). T y caf. T helado sin endulzar. Bebidas dietticas. Dulces y [employer], gelatina y helado de frutas. Pequeas cantidades de chocolate amargo. Limite todos los dulces y postres. Alios y [employer] y condimentos. Es posible que los  productos que se enumeran ms [place] no constituyan una lista completa de los alimentos y las bebidas que puede tomar. Consulte a un nutricionista para conocer ms opciones. Qu alimentos debo evitar? Nils Pyle Fruta enlatada en almbar espeso. Frutas con salsa de crema o mantequilla. Frutas fritas. Limite el consumo de coco. Verduras Verduras cocinadas con salsas de queso, crema o mantequilla. Verduras fritas. Cereales Panes elaborados con grasas saturadas o trans, aceites o [employer]ak. Croissants. Panecillos dulces. Rosquillas. Galletas con alto contenido de grasas, como las que galletas de queso y las papas fritas. Carnes y [address] protenas [address] grasas, como perros calientes, Penndel de res, salchichas, tocino, asado de Producer, television/film/video o [place]. Fiambres con alto contenido de [place], como salame y Loss adjuster, chartered. Caviar. Pato y ganso domsticos. Vsceras, como hgado. Lcteos Crema, crema agria, queso crema y [place] cottage con crema. Quesos enteros. Leche entera o al 2 % (lquida, evaporada o condensada). Suero de [employer]. Salsa de crema o queso con alto contenido de [place]. Yogur de East[employer]ak. Grasas y [place] de carne o materia grasa. Manteca de cacao, aceites hidrogenados, aceite de palma, aceite de coco, aceite de palmiste. Grasas y [address] grasas slidas, incluida la grasa del tocino, el cerdo [place], la [place] de cerdo y Civil engineer, contracting. Sustitutos no lcteos de la crema. Aderezos para ensalada con queso o crema agria. Bebidas Refrescos regulares y cualquier bebida  con agregado de azcar. Dulces y [employer]. Pudin. Galletas dulces. Bizcochuelos. Pasteles. Chocolate con leche o chocolate blanco. Almbares con mantequilla. Helados o bebidas elaboradas con helado con alto contenido de grasas. Es posible que los productos que se enumeran ms arriba no sean una lista completa de los alimentos y las bebidas que se Theatre stage manager. Consulte a un nutricionista para obtener ms  informacin. Resumen La planificacin de comidas cardiosaludables implica limitar las grasas poco saludables, aumentar las grasas saludables, limitar el consumo de sal (sodio) y Radio producer otros cambios en la dieta y el estilo de [place]. Baje de peso si es necesario. Perder solo del 5 al 10 % de su peso corporal puede ayudarlo a mejorar su estado de salud general y a Education officer, museum, como la diabetes y las enfermedades cardacas. Propngase seguir una dieta equilibrada, que incluya frutas y verduras, productos lcteos descremados o semidescremados, protenas magras, frutos secos y legumbres, cereales integrales y aceites y grasas cardiosaludables. Esta informacin no tiene Theme park manager el consejo del mdico. Asegrese de hacerle al mdico cualquier pregunta que tenga. Document Revised: 10/10/2021 Document Reviewed: 10/10/2021 Elsevier Patient Education  2024 Elsevier Inc. Recuento de carbohidratos para la diabetes mellitus en los adultos Carbohydrate Counting for Diabetes Mellitus, Adult El recuento de carbohidratos es un mtodo para llevar un registro de la cantidad de carbohidratos que se ingieren. La ingesta de carbohidratos aumenta la cantidad de azcar (glucosa) en la sangre. El recuento de la cantidad de carbohidratos que ingiere mejora el control de su glucemia. Esto, a su vez, le ayuda a controlar su diabetes. Los carbohidratos se miden en gramos (g) por porcin. Es importante saber la cantidad de carbohidratos (en gramos o por tamao de porcin) que se puede ingerir en cada comida. Esto es [name]. Un nutricionista puede ayudarlo a crear un plan de alimentacin y a calcular la cantidad de carbohidratos que debe ingerir en cada comida y colacin. Qu alimentos contienen carbohidratos? Los siguientes alimentos incluyen carbohidratos: Granos, como panes y cereales. Frijoles secos y productos con soja. Verduras con almidn, como papas, guisantes y maz. Nils Pyle y jugos  de frutas. Leche y Dentist. Dulces y colaciones, como pasteles, galletas, caramelos, papas fritas de bolsa y refrescos. Cmo se calculan los carbohidratos de los alimentos? Hay dos maneras de calcular los carbohidratos de los alimentos. Puede leer las etiquetas de los alimentos o aprender cules son los tamaos de las porciones estndar de los alimentos. Puede usar cualquiera de estos mtodos o una combinacin de [place]. Usar la [name] La lista de Informacin nutricional est incluida en las etiquetas de casi todas las bebidas y los alimentos envasados de los [place]. Esto incluye lo siguiente: El tamao de la porcin. Informacin sobre los nutrientes de cada porcin, incluidos los gramos de carbohidratos por porcin. Para usar la Informacin nutricional, decida cuntas porciones tomar. Luego, multiplique el nmero de porciones por la cantidad de carbohidratos por porcin. El nmero resultante es la cantidad total de carbohidratos que comer. Conocer los tamaos de las porciones estndar de los alimentos Cuando coma alimentos que contengan carbohidratos y que no estn envasados o no incluyan la informacin nutricional en la etiqueta, debe medir las porciones para poder calcular los gramos de carbohidratos. Mida los alimentos que comer con una balanza de alimentos o una taza medidora, si es necesario. Decida cuntas porciones de [name], systems. Multiplique el nmero de porciones por 15. En los alimentos que contienen carbohidratos, una porcin [place] a 15  g de carbohidratos. Por ejemplo, si come 2 tazas o 10 onzas (300 g) de fresas, habr comido 2 porciones y 30 g de carbohidratos (2 porciones x 15 g = 30 g). En el caso de las comidas que contienen mezclas de ms de un alimento, como las sopas y los guisos, debe calcular los carbohidratos de cada alimento que se incluye. La siguiente lista contiene los tamaos de porciones estndar de los alimentos ricos  en carbohidratos ms comunes. Cada una de estas porciones tiene aproximadamente 15 g de carbohidratos: 1 rebanada de pan. 1 tortilla de seis pulgadas (15 cm). ? de taza o 2 onzas (53 g) de arroz o pasta cocidos.  taza o 3 onzas (85 g) de lentejas o frijoles cocidos o enlatados, escurridos y enjuagados.  taza o 3 onzas (85 g) de verduras con almidn, como guisantes, maz o zapallo.  taza o 4 onzas (120 g) de cereal caliente.  taza o 3 onzas (85 g) de papas hervidas o en pur, o  o 3 onzas (85 g) de una papa grande al horno.  taza o 4 onzas fluidas (118 ml) de jugo de frutas. 1 taza u 8 onzas fluidas (237 ml) de leche. 1 unidad pequea o 4 onzas (106 g) de manzana.  unidad o 2 onzas (63 g) de una banana mediana. 1 taza o 5 oz (150 g) de fresas. 3 tazas o 1 oz (28.3 g) de palomitas de maz. Cul sera un ejemplo de recuento de carbohidratos? Para calcular los gramos de carbohidratos de este ejemplo de comida, siga los pasos que se describen a continuacin. Ejemplo de comida 3 onzas (85 g) de pechugas de pollo. ? de taza o 4 onzas (106 g) de arroz integral.  taza o 3 onzas (85 g) de maz. 1 taza u 8 onzas fluidas (237 ml) de leche. 1 taza o 5 onzas (150 g) de fresas con crema batida sin azcar. Clculo de carbohidratos Identifique los alimentos que contienen carbohidratos: Arroz. Maz. Leche. Jinny Sanders. Calcule cuntas porciones come de cada alimento: 2 porciones de arroz. 1 porcin de maz. 1 porcin de leche. 1 porcin de fresas. Multiplique cada nmero de porciones por 15 g: 2 porciones de arroz x 15 g = 30 g. 1 porcin de maz x 15 g = 15 g. 1 porcin de leche x 15 g = 15 g. 1 porcin de fresas x 15 g = 15 g. Sume todas las cantidades para conocer el total de gramos de carbohidratos consumidos: 30 g + 15 g + 15 g + 15 g = 75 g de carbohidratos en total. Consejos para seguir este plan Al ir de compras [place] un plan de comidas y luego haga una lista de compras. Compre  verduras frescas y congeladas, frutas frescas y congeladas, productos lcteos, huevos, frijoles, lentejas y cereales integrales. Fjese en las etiquetas de los alimentos. Elija los alimentos que tengan ms fibra y Neurosurgeon. Evite los alimentos procesados y los alimentos con [name], clinical. Planificacin de las comidas Trate de consumir la misma cantidad de gramos de carbohidratos en cada comida y en cada colacin. Planifique tomar comidas y colaciones regulares y equilibradas. Dnde buscar ms informacin [name]: diabetes.org Centers for Disease Control and Prevention (Centros para el Control y la Prevencin de [place]): TonerPromos.no Academy of Nutrition and Dietetics (Academia de Nutricin y Pension scheme manager): Theme park manager.org Association of Diabetes Care & Education Specialists (Asociacin de Especialistas en Atencin y Educacin sobre la Diabetes): diabeteseducator.org Resumen El  recuento de carbohidratos es un mtodo para llevar un registro de la cantidad de carbohidratos que se ingieren. La ingesta de carbohidratos aumenta la cantidad de azcar (glucosa) en la sangre. El recuento de la cantidad de carbohidratos que ingiere mejora el control de su glucemia. Esto le ayuda a Chief Operating Officer su diabetes. Un nutricionista puede ayudarlo a crear un plan de alimentacin y a calcular la cantidad de carbohidratos que debe ingerir en cada comida y colacin. Esta informacin no tiene Theme park manager el consejo del mdico. Asegrese de hacerle al mdico cualquier pregunta que tenga. Document Revised: 05/09/2020 Document Reviewed: 05/09/2020 Elsevier Patient Education  2024 [employer].

## 2023-05-15 ENCOUNTER — Ambulatory Visit: Payer: Self-pay

## 2023-05-27 ENCOUNTER — Ambulatory Visit: Payer: Self-pay | Admitting: Gerontology

## 2023-06-03 ENCOUNTER — Other Ambulatory Visit: Payer: Self-pay

## 2023-06-03 ENCOUNTER — Encounter: Payer: Self-pay | Admitting: Gerontology

## 2023-06-03 ENCOUNTER — Ambulatory Visit: Payer: Self-pay | Admitting: Gerontology

## 2023-06-03 VITALS — BP 111/74 | HR 95 | Ht 61.0 in | Wt 104.7 lb

## 2023-06-03 DIAGNOSIS — Z794 Long term (current) use of insulin: Secondary | ICD-10-CM

## 2023-06-03 DIAGNOSIS — E1169 Type 2 diabetes mellitus with other specified complication: Secondary | ICD-10-CM

## 2023-06-03 MED ORDER — ATORVASTATIN CALCIUM 80 MG PO TABS
80.0000 mg | ORAL_TABLET | Freq: Every day | ORAL | 1 refills | Status: DC
Start: 2023-06-03 — End: 2024-01-08
  Filled 2023-06-03: qty 90, 90d supply, fill #0

## 2023-06-03 NOTE — Patient Instructions (Signed)
,Recuento de carbohidratos para la diabetes mellitus en los adultos Carbohydrate Counting for Diabetes Mellitus, Adult El recuento de carbohidratos es un mtodo para llevar un registro de la cantidad de carbohidratos que se ingieren. La ingesta de carbohidratos aumenta la cantidad de azcar (glucosa) en la sangre. El recuento de la cantidad de carbohidratos que ingiere mejora el control de su glucemia. Esto, a su vez, le ayuda a controlar su diabetes. Los carbohidratos se miden en gramos (g) por porcin. Es importante saber la cantidad de carbohidratos (en gramos o por tamao de porcin) que se puede ingerir en cada comida. Esto es [name]. Un nutricionista puede ayudarlo a crear un plan de alimentacin y a calcular la cantidad de carbohidratos que debe ingerir en cada comida y colacin. Qu alimentos contienen carbohidratos? Los siguientes alimentos incluyen carbohidratos: Granos, como panes y cereales. Frijoles secos y productos con soja. Verduras con almidn, como papas, guisantes y maz. Nils Pyle y jugos de frutas. Leche y Dentist. Dulces y colaciones, como pasteles, galletas, caramelos, papas fritas de bolsa y refrescos. Cmo se calculan los carbohidratos de los alimentos? Hay dos maneras de calcular los carbohidratos de los alimentos. Puede leer las etiquetas de los alimentos o aprender cules son los tamaos de las porciones estndar de los alimentos. Puede usar cualquiera de estos mtodos o una combinacin de [place]. Usar la [name] La lista de Informacin nutricional est incluida en las etiquetas de casi todas las bebidas y los alimentos envasados de los [place]. Esto incluye lo siguiente: El tamao de la porcin. Informacin sobre los nutrientes de cada porcin, incluidos los gramos de carbohidratos por porcin. Para usar la Informacin nutricional, decida cuntas porciones tomar. Luego, multiplique el nmero de porciones por la cantidad  de carbohidratos por porcin. El nmero resultante es la cantidad total de carbohidratos que comer. Conocer los tamaos de las porciones estndar de los alimentos Cuando coma alimentos que contengan carbohidratos y que no estn envasados o no incluyan la informacin nutricional en la etiqueta, debe medir las porciones para poder calcular los gramos de carbohidratos. Mida los alimentos que comer con una balanza de alimentos o una taza medidora, si es necesario. Decida cuntas porciones de [name], systems. Multiplique el nmero de porciones por 15. En los alimentos que contienen carbohidratos, una porcin [place] a 15 g de carbohidratos. Por ejemplo, si come 2 tazas o 10 onzas (300 g) de fresas, habr comido 2 porciones y 30 g de carbohidratos (2 porciones x 15 g = 30 g). En el caso de las comidas que contienen mezclas de ms de un alimento, como las sopas y los guisos, debe calcular los carbohidratos de cada alimento que se incluye. La siguiente lista contiene los tamaos de porciones estndar de los alimentos ricos en carbohidratos ms comunes. Cada una de estas porciones tiene aproximadamente 15 g de carbohidratos: 1 rebanada de pan. 1 tortilla de seis pulgadas (15 cm). ? de taza o 2 onzas (53 g) de arroz o pasta cocidos.  taza o 3 onzas (85 g) de lentejas o frijoles cocidos o enlatados, escurridos y enjuagados.  taza o 3 onzas (85 g) de verduras con almidn, como guisantes, maz o zapallo.  taza o 4 onzas (120 g) de cereal caliente.  taza o 3 onzas (85 g) de papas hervidas o en pur, o  o 3 onzas (85 g) de una papa grande al horno.  taza o 4 onzas fluidas (118 ml) de jugo de frutas. 1 taza u  8 onzas fluidas (237 ml) de leche. 1 unidad pequea o 4 onzas (106 g) de manzana.  unidad o 2 onzas (63 g) de una banana mediana. 1 taza o 5 oz (150 g) de fresas. 3 tazas o 1 oz (28.3 g) de palomitas de maz. Cul sera un ejemplo de recuento de carbohidratos? Para calcular los gramos  de carbohidratos de este ejemplo de comida, siga los pasos que se describen a continuacin. Ejemplo de comida 3 onzas (85 g) de pechugas de pollo. ? de taza o 4 onzas (106 g) de arroz integral.  taza o 3 onzas (85 g) de maz. 1 taza u 8 onzas fluidas (237 ml) de leche. 1 taza o 5 onzas (150 g) de fresas con crema batida sin azcar. Clculo de carbohidratos Identifique los alimentos que contienen carbohidratos: Arroz. Maz. Leche. Jinny Sanders. Calcule cuntas porciones come de cada alimento: 2 porciones de arroz. 1 porcin de maz. 1 porcin de leche. 1 porcin de fresas. Multiplique cada nmero de porciones por 15 g: 2 porciones de arroz x 15 g = 30 g. 1 porcin de maz x 15 g = 15 g. 1 porcin de leche x 15 g = 15 g. 1 porcin de fresas x 15 g = 15 g. Sume todas las cantidades para conocer el total de gramos de carbohidratos consumidos: 30 g + 15 g + 15 g + 15 g = 75 g de carbohidratos en total. Consejos para seguir este plan Al ir de compras [place] un plan de comidas y luego haga una lista de compras. Compre verduras frescas y congeladas, frutas frescas y congeladas, productos lcteos, huevos, frijoles, lentejas y cereales integrales. Fjese en las etiquetas de los alimentos. Elija los alimentos que tengan ms fibra y Neurosurgeon. Evite los alimentos procesados y los alimentos con [name], clinical. Planificacin de las comidas Trate de consumir la misma cantidad de gramos de carbohidratos en cada comida y en cada colacin. Planifique tomar comidas y colaciones regulares y equilibradas. Dnde buscar ms informacin [name]: diabetes.org Centers for Disease Control and Prevention (Centros para el Control y la Prevencin de [place]): TonerPromos.no Academy of Nutrition and Dietetics (Academia de Nutricin y Pension scheme manager): Theme [email or web address] Association of Diabetes Care & Education Specialists (Asociacin de Especialistas en  Atencin y Educacin sobre la Diabetes): diabeteseducator.org Resumen El recuento de carbohidratos es un mtodo para llevar un registro de la cantidad de carbohidratos que se ingieren. La ingesta de carbohidratos aumenta la cantidad de azcar (glucosa) en la sangre. El recuento de la cantidad de carbohidratos que ingiere mejora el control de su glucemia. Esto le ayuda a Chief Operating Officer su diabetes. Un nutricionista puede ayudarlo a crear un plan de alimentacin y a calcular la cantidad de carbohidratos que debe ingerir en cada comida y colacin. Esta informacin no tiene Theme park manager el consejo del mdico. Asegrese de hacerle al mdico cualquier pregunta que tenga. Document Revised: 05/09/2020 Document Reviewed: 05/09/2020 Elsevier Patient Education  2024 [employer].

## 2023-06-03 NOTE — Progress Notes (Signed)
Established Patient Office Visit  Subjective   Patient ID: Joe Kelly, male    DOB: 09/17/1990  Age: 32 y.o. MRN: [id number]  Chief Complaint  Patient presents with   Follow-up    HPI Joe Kelly is a 32 year old male who has history of diabetes, hyperlipidemia, presents for routine follow-up visit and lab review. He states that he's compliant with his medications, denies side effects, but does not adhere to ADA diet.  His hemoglobin A1c was 12.7% when checked on 04/30/23. He states that he checks his blood glucose bid, forgot to bring his log. He states that his blood glucose readings  averages between 140 mg/dl and 782 mg/dl. This morning was 155 mg/dl, and it was 956 mg/dl during visit. He denies hypoglycemic symptoms, but endorses hyperglycemic symptoms, denies peripheral neuropathy and performs daily foot checks. He was started on Ergocalciferol for low vit D. He takes it once a week. He reports compliances and effectiveness of medication.  He denies chest pain, palpitation, light headedness and vision changes. Overall, he states that he's doing well and offers no further complaint.      Patient Active Problem List   Diagnosis Date Noted   Vitamin D deficiency 11/14/2022   Shortness of breath    Hypomagnesemia    Hyperlipidemia    Leukocytosis    Type 2 diabetes mellitus with hyperglycemia (HCC) 01/23/2021   Hyperlipidemia associated with type 2 diabetes mellitus (HCC) 01/23/2021   Hypokalemia    DKA (diabetic ketoacidosis) (HCC) 12/05/2020   Hyponatremia 12/05/2020   Dehydration 12/05/2020   Past Medical History:  Diagnosis Date   Diabetes mellitus without complication (HCC)    Hyperlipidemia    Past Surgical History:  Procedure Laterality Date   NO PAST SURGERIES     Social History   Tobacco Use   Smoking status: Former    Current packs/day: 0.00    Types: Cigarettes    Quit date: 12/2020    Years since quitting: 2.5   Smokeless  tobacco: Never  Vaping Use   Vaping status: Never Used  Substance Use Topics   Alcohol use: Not Currently    Comment: last use 12 years ago   Drug use: Not Currently    Types: Marijuana    Comment: quit 2 years ago   Social History   Socioeconomic History   Marital status: Single    Spouse name: Not on file   Number of children: Not on file   Years of education: Not on file   Highest education level: Not on file  Occupational History   Not on file  Tobacco Use   Smoking status: Former    Current packs/day: 0.00    Types: Cigarettes    Quit date: 12/2020    Years since quitting: 2.5   Smokeless tobacco: Never  Vaping Use   Vaping status: Never Used  Substance and Sexual Activity   Alcohol use: Not Currently    Comment: last use 12 years ago   Drug use: Not Currently    Types: Marijuana    Comment: quit 2 years ago   Sexual activity: Yes  Other Topics Concern   Not on file  Social History Narrative   Not on file   Social Determinants of Health   Financial Resource Strain: Low Risk  (11/14/2022)   Overall Financial Resource Strain (CARDIA)    Difficulty of Paying Living Expenses: Not hard at all  Food Insecurity: No Food Insecurity (11/14/2022)   Hunger  Vital Sign    Worried About Programme researcher, broadcasting/film/video in the Last Year: Never true    Ran Out of Food in the Last Year: Never true  Transportation Needs: No Transportation Needs (11/14/2022)   PRAPARE - Administrator, Civil Service (Medical): No    Lack of Transportation (Non-Medical): No  Physical Activity: Insufficiently Active (11/14/2022)   Exercise Vital Sign    Days of Exercise per Week: 3 days    Minutes of Exercise per Session: 20 min  Stress: No Stress Concern Present (11/14/2022)   [employer] of Occupational Health - Occupational Stress Questionnaire    Feeling of Stress : Not at all  Social Connections: Moderately Isolated (11/14/2022)   Social Connection and Isolation Panel [NHANES]     Frequency of Communication with Friends and Family: More than three times a week    Frequency of Social Gatherings with Friends and Family: More than three times a week    Attends Religious Services: Never    Database administrator or Organizations: No    Attends Banker Meetings: Never    Marital Status: Living with partner  Intimate Partner Violence: Not At Risk (11/14/2022)   Humiliation, Afraid, Rape, and Kick questionnaire    Fear of Current or Ex-Partner: No    Emotionally Abused: No    Physically Abused: No    Sexually Abused: No   Family Status  Relation Name Status   Mother  Alive   Father  Alive   Sister  Alive   Brother  Alive   Brother  Alive   Brother  Alive  No partnership data on file   Family History  Problem Relation Age of Onset   Diabetes Mother    Healthy Father    Allergies  Allergen Reactions   Coconut Flavor     Dizziness       Review of Systems  Constitutional: Negative.   Eyes: Negative.   Respiratory: Negative.    Cardiovascular: Negative.   Genitourinary: Negative.   Skin: Negative.   Neurological: Negative.   Endo/Heme/Allergies: Negative.   Psychiatric/Behavioral: Negative.        Objective:     BP 111/74   Pulse 95   Ht 5\' 1"  (1.549 m)   Wt 104 lb 11.2 oz (47.5 kg)   SpO2 96%   BMI 19.78 kg/m  BP Readings from Last 3 Encounters:  06/03/23 111/74  04/30/23 100/67  01/29/23 101/70   Wt Readings from Last 3 Encounters:  06/03/23 104 lb 11.2 oz (47.5 kg)  04/30/23 104 lb 8 oz (47.4 kg)  01/29/23 104 lb 6.4 oz (47.4 kg)      Physical Exam HENT:     Head: Normocephalic and atraumatic.     Mouth/Throat:     Mouth: Mucous membranes are moist.  Eyes:     Pupils: Pupils are equal, round, and reactive to light.  Cardiovascular:     Rate and Rhythm: Normal rate and regular rhythm.     Pulses: Normal pulses.     Heart sounds: Normal heart sounds.  Pulmonary:     Effort: Pulmonary effort is normal.      Breath sounds: Normal breath sounds.  Skin:    General: Skin is warm.  Neurological:     General: No focal deficit present.     Mental Status: He is alert and oriented to person, place, and time.  Psychiatric:        Mood and  Affect: Mood normal.        Behavior: Behavior normal.        Thought Content: Thought content normal.        Judgment: Judgment normal.      No results found for any visits on 06/03/23.  Last CBC Lab Results  Component Value Date   WBC 4.1 10/30/2022   HGB 12.1 (L) 10/30/2022   HCT 36.0 (L) 10/30/2022   MCV 93 10/30/2022   MCH 31.1 10/30/2022   RDW 12.5 10/30/2022   PLT 241 10/30/2022   Last metabolic panel Lab Results  Component Value Date   GLUCOSE 335 (H) 10/30/2022   NA 137 10/30/2022   K 4.5 10/30/2022   CL 96 10/30/2022   CO2 26 10/30/2022   BUN 12 10/30/2022   CREATININE 0.52 (L) 10/30/2022   EGFR 136 10/30/2022   CALCIUM 9.4 10/30/2022   PHOS 2.5 03/11/2021   PROT 6.3 10/30/2022   ALBUMIN 4.2 10/30/2022   LABGLOB 2.1 10/30/2022   AGRATIO 2.0 10/30/2022   BILITOT 0.3 10/30/2022   ALKPHOS 168 (H) 10/30/2022   AST 16 10/30/2022   ALT 16 10/30/2022   ANIONGAP 7 03/11/2021   Last lipids Lab Results  Component Value Date   CHOL 284 (H) 01/29/2023   HDL 45 01/29/2023   LDLCALC 159 (H) 01/29/2023   TRIG 416 (H) 01/29/2023   CHOLHDL 6.3 (H) 01/29/2023   Last hemoglobin A1c Lab Results  Component Value Date   HGBA1C 12.7 (A) 04/30/2023   Last thyroid functions Lab Results  Component Value Date   TSH 1.934 12/05/2020   Last vitamin D Lab Results  Component Value Date   VD25OH 13.4 (L) 01/29/2023   Last vitamin B12 and Folate Lab Results  Component Value Date   VITAMINB12 798 03/28/2021   FOLATE 16.9 03/28/2021      The ASCVD Risk score (Arnett DK, et al., 2019) failed to calculate for the following reasons:   The 2019 ASCVD risk score is only valid for ages 38 to 40    Assessment & Plan:  1. Type 2 diabetes  mellitus with hyperglycemia, with long-term current use of insulin (HCC)  His diabetes is not controlled due to noncompliance with his diet.  His hemoglobin A1c was 12.7% (goal should be less than 7%). He will continue on current medication, ow carbohydrate/non concentrated sweet diet and exercise as tolerated. He was encouraged to check his blood glucose tid, record and bring log to follow up visit.  - POCT Glucose (CBG); Future  2. Hyperlipidemia associated with type 2 diabetes mellitus (HCC) -He will continue on current medication, low-fat and cholesterol diet.  - atorvastatin (LIPITOR) 80 MG tablet; Take 1 tablet (80 mg total) by mouth daily.  Dispense: 90 tablet; Refill: 1     Return in about 2 weeks (around 06/17/2023).    Chioma Trellis Paganini, NP

## 2023-06-17 ENCOUNTER — Ambulatory Visit: Payer: Self-pay | Admitting: Gerontology

## 2023-06-17 ENCOUNTER — Other Ambulatory Visit: Payer: Self-pay

## 2023-07-01 ENCOUNTER — Other Ambulatory Visit: Payer: Self-pay

## 2023-07-01 ENCOUNTER — Encounter: Payer: Self-pay | Admitting: Gerontology

## 2023-07-01 ENCOUNTER — Ambulatory Visit: Payer: Self-pay | Admitting: Gerontology

## 2023-07-01 VITALS — BP 101/69 | HR 87 | Ht 61.0 in | Wt 108.0 lb

## 2023-07-01 DIAGNOSIS — Z794 Long term (current) use of insulin: Secondary | ICD-10-CM

## 2023-07-01 DIAGNOSIS — E559 Vitamin D deficiency, unspecified: Secondary | ICD-10-CM

## 2023-07-01 DIAGNOSIS — E1169 Type 2 diabetes mellitus with other specified complication: Secondary | ICD-10-CM

## 2023-07-01 LAB — GLUCOSE, POCT (MANUAL RESULT ENTRY): POC Glucose: 153 mg/dL — AB (ref 70–99)

## 2023-07-01 MED ORDER — BASAGLAR KWIKPEN 100 UNIT/ML ~~LOC~~ SOPN
17.0000 [IU] | PEN_INJECTOR | Freq: Every day | SUBCUTANEOUS | 5 refills | Status: DC
  Filled 2023-07-01: qty 15, 88d supply, fill #0

## 2023-07-01 NOTE — Progress Notes (Signed)
Established Patient Office Visit  Subjective   Patient ID: Joe Kelly, male    DOB: 10/17/1990  Age: 32 y.o. MRN: [id number]  Chief Complaint  Patient presents with   Follow-up    HPI  Joe Kelly is a 32 year old male who has history of diabetes, hyperlipidemia, presents for routine follow-up visit and lab review.  His HgbA1c checked on 04/30/23 was 12.7%, he states that he is checking his blood glucose BID, and it ranges from 230- 290 mg/dL. He states that he is compliant with his medication. He reports polydipsia and polyuria, but denies polyphagia. Denies hypoglycemia symptoms. He denies numbness and tingling in upper or lower extremities. He states that his diet consists of chicken, salad, beef, he has minimize his tortilla and rice intake.  He reports that he is physical active, 30 minutes 5 days per week. Fasting blood glucose during the visit today is 153 mg/dL. Pt refused influenza vaccine. Overall, he is doing well and offers no further complaints at this time.   Review of Systems  Constitutional: Negative.   HENT: Negative.    Eyes: Negative.   Respiratory: Negative.    Cardiovascular: Negative.   Gastrointestinal: Negative.   Genitourinary:  Positive for frequency.  Musculoskeletal: Negative.   Skin: Negative.   Neurological: Negative.   Endo/Heme/Allergies: Negative.   Psychiatric/Behavioral: Negative.        Objective:     BP 101/69   Pulse 87   Ht 5\' 1"  (1.549 m)   Wt 108 lb (49 kg)   SpO2 98%   BMI 20.41 kg/m  BP Readings from Last 3 Encounters:  07/01/23 101/69  06/03/23 111/74  04/30/23 100/67   Wt Readings from Last 3 Encounters:  07/01/23 108 lb (49 kg)  06/03/23 104 lb 11.2 oz (47.5 kg)  04/30/23 104 lb 8 oz (47.4 kg)      Physical Exam Constitutional:      Appearance: Normal appearance. He is normal weight.  HENT:     Head: Normocephalic.     Nose: Nose normal.     Mouth/Throat:     Mouth: Mucous membranes  are moist.     Pharynx: Oropharynx is clear.  Eyes:     Conjunctiva/sclera: Conjunctivae normal.     Pupils: Pupils are equal, round, and reactive to light.  Cardiovascular:     Rate and Rhythm: Normal rate and regular rhythm.     Pulses:          Posterior tibial pulses are 3+ on the right side and 3+ on the left side.     Heart sounds: Normal heart sounds.  Pulmonary:     Effort: Pulmonary effort is normal.     Breath sounds: Normal breath sounds.  Abdominal:     General: Abdomen is flat. Bowel sounds are normal.     Palpations: Abdomen is soft.  Musculoskeletal:        General: Normal range of motion.     Cervical back: Normal range of motion.       Feet:  Feet:     Right foot:     Skin integrity: Skin integrity normal.     Left foot:     Skin integrity: Dry skin present.     Toenail Condition: Left toenails are abnormally thick.  Skin:    General: Skin is warm and dry.  Neurological:     General: No focal deficit present.     Mental Status: He is alert and oriented  to person, place, and time. Mental status is at baseline.  Psychiatric:        Mood and Affect: Mood normal.        Behavior: Behavior normal.        Thought Content: Thought content normal.        Judgment: Judgment normal.      Results for orders placed or performed in visit on 07/01/23  POCT Glucose (CBG)  Result Value Ref Range   POC Glucose 153 (A) 70 - 99 mg/dl    Last CBC Lab Results  Component Value Date   WBC 4.1 10/30/2022   HGB 12.1 (L) 10/30/2022   HCT 36.0 (L) 10/30/2022   MCV 93 10/30/2022   MCH 31.1 10/30/2022   RDW 12.5 10/30/2022   PLT 241 10/30/2022   Last metabolic panel Lab Results  Component Value Date   GLUCOSE 335 (H) 10/30/2022   NA 137 10/30/2022   K 4.5 10/30/2022   CL 96 10/30/2022   CO2 26 10/30/2022   BUN 12 10/30/2022   CREATININE 0.52 (L) 10/30/2022   EGFR 136 10/30/2022   CALCIUM 9.4 10/30/2022   PHOS 2.5 03/11/2021   PROT 6.3 10/30/2022   ALBUMIN  4.2 10/30/2022   LABGLOB 2.1 10/30/2022   AGRATIO 2.0 10/30/2022   BILITOT 0.3 10/30/2022   ALKPHOS 168 (H) 10/30/2022   AST 16 10/30/2022   ALT 16 10/30/2022   ANIONGAP 7 03/11/2021   Last lipids Lab Results  Component Value Date   CHOL 284 (H) 01/29/2023   HDL 45 01/29/2023   LDLCALC 159 (H) 01/29/2023   TRIG 416 (H) 01/29/2023   CHOLHDL 6.3 (H) 01/29/2023   Last hemoglobin A1c Lab Results  Component Value Date   HGBA1C 12.7 (A) 04/30/2023   Last thyroid functions Lab Results  Component Value Date   TSH 1.934 12/05/2020   Last vitamin D Lab Results  Component Value Date   VD25OH 13.4 (L) 01/29/2023   Last vitamin B12 and Folate Lab Results  Component Value Date   VITAMINB12 798 03/28/2021   FOLATE 16.9 03/28/2021      The ASCVD Risk score (Arnett DK, et al., 2019) failed to calculate for the following reasons:   The 2019 ASCVD risk score is only valid for ages 38 to 84    Assessment & Plan:  1. Type 2 diabetes mellitus with hyperglycemia, with long-term current use of insulin (HCC) -His diabetes is uncontrolled. His fasting blood glucose during today's visit is 153 mg/dL. Glargine was increased from 14 to 17 units. He was advised to take medication as prescribed, exercise as tolerated, follow a low carbohydrate/ non concentrated sweet diet. He was advised of potential kidney failure, peripheral neuropathy, amputation, and diabetic ketoacidosis due to uncontrolled diabetes. He verbalized understanding. - POCT Glucose (CBG); Future - Comp Met (CMET); Future - Insulin Glargine (BASAGLAR KWIKPEN) 100 UNIT/ML; Inject 17 Units into the skin at bedtime.  Dispense: 15 mL; Refill: 5  2. Vitamin D deficiency -He states that he is compliant with his medication. Vitamin D will be revaluate during today's visit.  - VITAMIN D 25 Hydroxy (Vit-D Deficiency, Fractures); Future  3. Hyperlipidemia associated with type 2 diabetes mellitus (HCC) -He will continue on current  medication, low-fat and cholesterol diet and exercise as tolerated.  - Lipid panel; Future     Follow up in clinic 07/23/2023, or sooner if symptoms worsen of fail to improve.    Chioma Trellis Paganini, NP, FNP student

## 2023-07-01 NOTE — Patient Instructions (Signed)
Diabetes mellitus y [place] fsica Diabetes Mellitus and Exercise La actividad fsica regular es importante para su salud, especialmente si tiene diabetes mellitus. La actividad fsica no sirve solo para Publishing copy de [place]. Tambin puede ayudar a aumentar la fuerza muscular y la densidad sea y a reducir la Art gallery manager y Development worker, community. Esto puede mejorar el nivel de resistencia y hacer que est en mejor forma y ms flexible. Por qu debo hacer actividad fsica si tengo diabetes? La actividad fsica tiene muchos beneficios para las personas con diabetes. Puede: Ayudar a bajar y Haematologist en la sangre (glucosa) bajo control. Ayudar al cuerpo a responder mejor y a ser ms sensible a la hormona insulina. Reducir la cantidad de insulina que el cuerpo necesita. Disminuir el riesgo de enfermedad cardaca de las siguientes maneras: Reduciendo la cantidad de colesterol "malo" y de triglicridos que tiene en el cuerpo. Aumentando la cantidad de colesterol "bueno" que tiene en el cuerpo. Bajando la presin arterial. Disminuyendo el nivel de glucemia. Cul es mi plan de Doroteo Glassman? El mdico o un especialista capacitado en el tratamiento de la diabetes (educador certificado en el cuidado de la diabetes) puede ayudarle a Event organiser un plan de actividad. Este plan puede ayudarle a encontrar el tipo de actividad fsica que ms le [place]. Tambin puede indicarle con qu frecuencia hacer actividad fsica y durante cunto [place]. Asegrese de lo siguiente: Haga por lo menos 150 minutos semanales de ejercicios de intensidad media o alta. Podra incluir caminatas rpidas, andar en bicicleta o gimnasia [place] acutica. Hacer ejercicios de elongacin y de fortalecimiento al menos 2 veces por semana. Estos pueden incluir yoga o levantar pesas. Reparta la [employer] en al menos 3 das de la [place]. Haga algn tipo de actividad fsica Management consultant. No deje pasar ms de 2 das seguidos sin hacer algn tipo de  [place]. Evite permanecer inactivo durante ms de 30 minutos seguidos. Tmese descansos frecuentes para caminar o estirarse. Elija actividades que disfrute. Establezca objetivos que sepa que puede cumplir. Comience lentamente y aumente la intensidad del ejercicio con el correr del [place]. Cmo controlo la diabetes durante la actividad fsica?  Controlar su nivel de glucemia Controle su glucemia antes y despus de hacer actividad fsica. Si la glucemia es de 240 mg/dl (21.3 mmol/l) o ms antes de comenzar a [employer] fsica, controle la orina para detectar la presencia de cetonas. Estas son sustancias qumicas producidas por el hgado. Si tiene [employer] orina, no haga ejercicio hasta que la glucemia se normalice. Si la glucemia es de 100 mg/dl (5.6 mmol/l) o menos, tome una colacin que tenga entre 15 y 20 gramos de carbohidratos. Controle su glucemia 15 minutos despus de la colacin para asegurarse de que el nivel est por encima de 100 mg/dl (5.6 mmol/l) antes de comenzar a hacer actividad fsica. El riesgo de glucemia baja (hipoglucemia) aumenta mientras hace actividad fsica y despus de hacerla. Conozca los sntomas de esta afeccin y cmo tratarla. Siga estas indicaciones en su casa: Tenga a mano una colacin con carbohidratos para usarla antes, durante y despus de la actividad fsica. Esto puede ayudar a prevenir o a tratar la hipoglucemia. Evite inyectarse insulina en las zonas del cuerpo que trabajarn durante la actividad fsica. Esto puede incluir: Los brazos, cuando vaya a Leisure centre manager al tenis. Las piernas, cuando est por irse a trotar. Haga un seguimiento de sus hbitos de [place] fsica. Esto puede ayudarlos a usted y a su mdico a observar y Recruitment consultant de  actividad. Escriba los siguientes datos: Qu come antes y despus de Radio producer actividad fsica. Los niveles de glucemia antes y despus de hacer ejercicios. El tipo y la cantidad de actividad fsica que  Biomedical engineer. Hable con el mdico antes de comenzar [place]. Es posible que deba hacer lo siguiente: Asegurarse de que la actividad sea segura para usted. Ajustar la insulina, los otros medicamentos y los alimentos que usted consume. Beba agua mientras hace actividad fsica. Esto puede impedir que pierda demasiada agua (deshidratacin). Tambin puede prevenir problemas causados por tener mucho calor en el cuerpo (golpe de calor). Dnde obtener ms informacin [name]: diabetes.org Association of Diabetes Care & Education Specialists (Asociacin de Especialistas en Atencin y Educacin sobre la Diabetes): diabeteseducator.org Esta informacin no tiene Theme park manager el consejo del mdico. Asegrese de hacerle al mdico cualquier pregunta que tenga. Document Revised: 03/04/2022 Document Reviewed: 03/04/2022 Elsevier Patient Education  2024 [employer].

## 2023-07-02 LAB — LIPID PANEL
Chol/HDL Ratio: 6.5 ratio — ABNORMAL HIGH (ref 0.0–5.0)
Cholesterol, Total: 239 mg/dL — ABNORMAL HIGH (ref 100–199)
HDL: 37 mg/dL — ABNORMAL LOW (ref >39)
LDL Chol Calc (NIH): 87 mg/dL (ref 0–99)
Triglycerides: 702 mg/dL (ref 0–149)
VLDL Cholesterol Cal: 115 mg/dL — ABNORMAL HIGH (ref 5–40)

## 2023-07-02 LAB — COMPREHENSIVE METABOLIC PANEL
ALT: 18 [IU]/L (ref 0–44)
AST: 19 [IU]/L (ref 0–40)
Albumin: 4.2 g/dL (ref 4.1–5.1)
Alkaline Phosphatase: 165 [IU]/L — ABNORMAL HIGH (ref 44–121)
BUN/Creatinine Ratio: 15 (ref 9–20)
BUN: 8 mg/dL (ref 6–20)
Bilirubin Total: 0.5 mg/dL (ref 0.0–1.2)
CO2: 28 mmol/L (ref 20–29)
Calcium: 9.6 mg/dL (ref 8.7–10.2)
Chloride: 98 mmol/L (ref 96–106)
Creatinine, Ser: 0.55 mg/dL — ABNORMAL LOW (ref 0.76–1.27)
Globulin, Total: 2.5 g/dL (ref 1.5–4.5)
Glucose: 97 mg/dL (ref 70–99)
Potassium: 3.2 mmol/L — ABNORMAL LOW (ref 3.5–5.2)
Sodium: 138 mmol/L (ref 134–144)
Total Protein: 6.7 g/dL (ref 6.0–8.5)
eGFR: 134 mL/min/{1.73_m2} (ref >59)

## 2023-07-02 LAB — VITAMIN D 25 HYDROXY (VIT D DEFICIENCY, FRACTURES): Vit D, 25-Hydroxy: 19.9 ng/mL — ABNORMAL LOW (ref 30.0–100.0)

## 2023-07-03 ENCOUNTER — Other Ambulatory Visit: Payer: Self-pay

## 2023-07-03 ENCOUNTER — Other Ambulatory Visit: Payer: Self-pay | Admitting: Gerontology

## 2023-07-03 DIAGNOSIS — E876 Hypokalemia: Secondary | ICD-10-CM

## 2023-07-03 DIAGNOSIS — E559 Vitamin D deficiency, unspecified: Secondary | ICD-10-CM

## 2023-07-03 DIAGNOSIS — E781 Pure hyperglyceridemia: Secondary | ICD-10-CM

## 2023-07-03 MED ORDER — POTASSIUM CHLORIDE ER 10 MEQ PO TBCR
10.0000 meq | EXTENDED_RELEASE_TABLET | Freq: Every day | ORAL | 0 refills | Status: DC
  Filled 2023-07-03: qty 5, 5d supply, fill #0

## 2023-07-03 MED ORDER — VITAMIN D (ERGOCALCIFEROL) 1.25 MG (50000 UNIT) PO CAPS
50000.0000 [IU] | ORAL_CAPSULE | ORAL | 0 refills | Status: DC
  Filled 2023-07-03: qty 16, 112d supply, fill #0

## 2023-07-03 MED ORDER — OMEGA-3-ACID ETHYL ESTERS 1 G PO CAPS
1.0000 g | ORAL_CAPSULE | Freq: Two times a day (BID) | ORAL | 0 refills | Status: DC
  Filled 2023-07-03: qty 60, 30d supply, fill #0

## 2023-08-05 ENCOUNTER — Ambulatory Visit: Payer: Self-pay | Admitting: Gerontology

## 2023-12-20 ENCOUNTER — Encounter: Payer: Self-pay | Admitting: Emergency Medicine

## 2023-12-20 ENCOUNTER — Ambulatory Visit
Admission: EM | Admit: 2023-12-20 | Discharge: 2023-12-20 | Disposition: A | Discharge status: Home / Self Care | Payer: Self-pay | Attending: Family Medicine | Admitting: Family Medicine

## 2023-12-20 DIAGNOSIS — L03115 Cellulitis of right lower limb: Secondary | ICD-10-CM

## 2023-12-20 MED ORDER — DOXYCYCLINE HYCLATE 100 MG PO CAPS: 100.0000 mg | ORAL_CAPSULE | Freq: Two times a day (BID) | ORAL | 0 refills | Status: DC

## 2023-12-20 NOTE — ED Triage Notes (Signed)
 Pt has an abscess on the back of his right lower leg. He states it started out as a small simple and has been growing in size. He states it has been draining. Started about 3 days ago. Pt is concerned because he is diabetic.        Danell Dunks [phone]

## 2023-12-20 NOTE — ED Provider Notes (Addendum)
 [facility]    CSN: [id number] Arrival date & time: 12/20/23  1354      History   Chief Complaint Chief Complaint  Patient presents with   Abscess    Right lower leg    HPI Joe Kelly is a 33 y.o. male.   HPI  A Spanish interpreter was used for this encounter:  Name: Joe Kelly #[id number]   Joe Kelly with presents for right lower leg injury. The area started out small bump and started getting bigger and bigger. The area is painful and  has been draining.  Symptoms started about 3 days ago. He cleaned it with some alcohol.  He is a type 2 diabetic and blood sugars have around 200.      Past Medical History:  Diagnosis Date   Diabetes mellitus without complication (HCC)    Hyperlipidemia     Patient Active Problem List   Diagnosis Date Noted   Vitamin D  deficiency 11/14/2022   Shortness of breath    Hypomagnesemia    Hyperlipidemia    Leukocytosis    Type 2 diabetes mellitus with hyperglycemia (HCC) 01/23/2021   Hyperlipidemia associated with type 2 diabetes mellitus (HCC) 01/23/2021   Hypokalemia    DKA (diabetic ketoacidosis) (HCC) 12/05/2020   Hyponatremia 12/05/2020   Dehydration 12/05/2020    Past Surgical History:  Procedure Laterality Date   NO PAST SURGERIES         Home Medications    Prior to Admission medications   Medication Sig Start Date End Date Taking? Authorizing Provider  doxycycline  (VIBRAMYCIN ) 100 MG capsule Take 1 capsule (100 mg total) by mouth 2 (two) times daily. 12/20/23  Yes Antwian Santaana, DO  Insulin  Glargine (BASAGLAR  KWIKPEN) 100 UNIT/ML Inject 17 Units into the skin at bedtime. 07/01/23  Yes Iloabachie, Chioma E, NP  acetaminophen  (TYLENOL ) 325 MG tablet Take 325 mg by mouth once as needed.    [provider]  albuterol  (VENTOLIN  HFA) 108 (90 Base) MCG/ACT inhaler Inhale 2 puffs into the lungs every 6 (six) hours as needed for wheezing or shortness of breath. 03/11/21   Verla Glaze, MD   atorvastatin  (LIPITOR) 80 MG tablet Take 1 tablet (80 mg total) by mouth daily. 06/03/23   Iloabachie, Chioma E, NP  blood glucose meter kit and supplies KIT Dispense based on patient and insurance preference. Use up to four times daily as directed to monitor blood glucose 01/17/21   Tukov-Yual, Orin Birk, NP  insulin  lispro (HUMALOG  KWIKPEN) 100 UNIT/ML KwikPen Inject 9 Units into the skin 3 times daily and follow sliding scale: Take extra 1 unit if sugar is 200-250; Take extra 2 units if 251-300; Take extra 3 units if 301-350; Take extra 4 units if 351-400; If over 401 contact MD or go to ED. 10/30/22   Iloabachie, Chioma E, NP  Insulin  Pen Needle (TECHLITE PEN NEEDLES) 32G X 4 MM MISC use with basaglar  and humalog  01/30/23   Iloabachie, Chioma E, NP  Insulin  Pen Needle 32G X 4 MM MISC Use as directed with insulin  pens. 03/15/21   Iloabachie, Chioma E, NP  omega-3 acid ethyl esters (LOVAZA ) 1 g capsule Take 1 (one) capsule by mouth 2 (two) times daily. 07/03/23   Iloabachie, Chioma E, NP  potassium chloride  (KLOR-CON ) 10 MEQ tablet Take 1 (one) tablet by mouth daily. 07/03/23   Iloabachie, Chioma E, NP  Vitamin D , Ergocalciferol , (DRISDOL ) 1.25 MG (50000 UNIT) CAPS capsule Take 1 capsule (50,000 Units total) by mouth every  7 (seven) days. 07/03/23   Iloabachie, Chioma E, NP    Family History Family History  Problem Relation Age of Onset   Diabetes Mother    Healthy Father     Social History Social History   Tobacco Use   Smoking status: Former    Current packs/day: 0.00    Types: Cigarettes    Quit date: 12/2020    Years since quitting: 3.0   Smokeless tobacco: Never  Vaping Use   Vaping status: Never Used  Substance Use Topics   Alcohol use: Not Currently    Comment: last use 12 years ago   Drug use: Not Currently    Types: Marijuana    Comment: quit 2 years ago     Allergies   Coconut flavoring agent (non-screening)   Review of Systems Review of Systems :negative unless  otherwise stated in HPI.      Physical Exam Triage Vital Signs ED Triage Vitals  Encounter Vitals Group     BP --      Systolic BP Percentile --      Diastolic BP Percentile --      Pulse --      Resp --      Temp --      Temp src --      SpO2 --      Weight 12/20/23 1421 108 lb 0.4 oz (49 kg)     Height 12/20/23 1421 5\' 1"  (1.549 m)     Head Circumference --      Peak Flow --      Pain Score 12/20/23 1420 8     Pain Loc --      Pain Education --      Exclude from Growth Chart --    No data found.  Updated Vital Signs BP 121/72 (BP Location: Right Arm)   Pulse (!) 104   Temp 98.6 F (37 C) (Oral)   Resp 16   Ht 5\' 1"  (1.549 m)   Wt 49 kg   SpO2 96%   BMI 20.41 kg/m   Visual Acuity Right Eye Distance:   Left Eye Distance:   Bilateral Distance:    Right Eye Near:   Left Eye Near:    Bilateral Near:     Physical Exam  GEN: alert, well appearing male, in no acute distress  EYES: no scleral injection or discharge RESP: no increased work of breathing MSK: no extremity edema, posterior right leg TTP, normal ROM of RLE NEURO: alert, moves all extremities appropriately SKIN: warm and dry; erythematous patch with central opening with dried blood, with surrounding induration, no fluctuance, no discharge   UC Treatments / Results  Labs (all labs ordered are listed, but only abnormal results are displayed) Labs Reviewed - No data to display  EKG   Radiology No results found.  Procedures Procedures (including critical care time)  Medications Ordered in UC Medications - No data to display  Initial Impression / Assessment and Plan / UC Course  I have reviewed the triage vital signs and the nursing notes.  Pertinent labs & imaging results that were available during my care of the patient were reviewed by me and considered in my medical decision making (see chart for details).     Patient is a 33 y.o. malewho presents for right calf lesion.  Overall,  patient is well-appearing and well-hydrated.  Vital signs stable.  Joe Kelly is afebrile.  Exam concerning for cellulitis no abscess at this time.  Treat with antibiotics as below.   Reviewed expectations regarding course of current medical issues.  All questions asked were answered.  Outlined signs and symptoms indicating need for more acute intervention. Patient verbalized understanding. After Visit Summary given.   Final Clinical Impressions(s) / UC Diagnoses   Final diagnoses:  Cellulitis of right lower extremity     Discharge Instructions      Adjunto el folleto. Pase por la farmacia a recoger su receta. Consulte con su mdico de cabecera o regrese a urgencias si no mejora.  See handout attached.  Stop by the pharmacy to pick up your prescription.  Follow up with your primary care provider or return to the urgent care, if not improving.        ED Prescriptions     Medication Sig Dispense Auth. Provider   doxycycline  (VIBRAMYCIN ) 100 MG capsule Take 1 capsule (100 mg total) by mouth 2 (two) times daily. 14 capsule Ogechi Kuehnel, DO      PDMP not reviewed this encounter.              Olliver Boyadjian, DO 12/20/23 1501    Fidel Huddle, DO 12/20/23 1502

## 2023-12-20 NOTE — Discharge Instructions (Addendum)
 Adjunto el folleto. Pase por la farmacia a recoger su receta. Consulte con su mdico de cabecera o regrese a urgencias si no mejora.  See handout attached.  Stop by the pharmacy to pick up your prescription.  Follow up with your primary care provider or return to the urgent care, if not improving.

## 2024-01-08 ENCOUNTER — Encounter: Payer: Self-pay | Admitting: Gerontology

## 2024-01-08 ENCOUNTER — Other Ambulatory Visit: Payer: Self-pay

## 2024-01-08 ENCOUNTER — Ambulatory Visit: Payer: Self-pay | Admitting: Gerontology

## 2024-01-08 VITALS — BP 99/64 | HR 96 | Ht 61.0 in | Wt 100.8 lb

## 2024-01-08 DIAGNOSIS — E1165 Type 2 diabetes mellitus with hyperglycemia: Secondary | ICD-10-CM

## 2024-01-08 DIAGNOSIS — E1169 Type 2 diabetes mellitus with other specified complication: Secondary | ICD-10-CM

## 2024-01-08 DIAGNOSIS — E559 Vitamin D deficiency, unspecified: Secondary | ICD-10-CM

## 2024-01-08 DIAGNOSIS — E781 Pure hyperglyceridemia: Secondary | ICD-10-CM

## 2024-01-08 DIAGNOSIS — L02415 Cutaneous abscess of right lower limb: Secondary | ICD-10-CM | POA: Insufficient documentation

## 2024-01-08 LAB — POCT GLYCOSYLATED HEMOGLOBIN (HGB A1C): Hemoglobin A1C: 9.9 % — AB (ref 4.0–5.6)

## 2024-01-08 LAB — GLUCOSE, POCT (MANUAL RESULT ENTRY): POC Glucose: 269 mg/dL — AB (ref 70–99)

## 2024-01-08 MED ORDER — BASAGLAR KWIKPEN 100 UNIT/ML ~~LOC~~ SOPN
14.0000 [IU] | PEN_INJECTOR | Freq: Every day | SUBCUTANEOUS | 1 refills | Status: AC
Start: 2024-01-08 — End: ?
  Filled 2024-01-08: qty 15, 107d supply, fill #0

## 2024-01-08 MED ORDER — ATORVASTATIN CALCIUM 80 MG PO TABS
80.0000 mg | ORAL_TABLET | Freq: Every day | ORAL | 0 refills | Status: AC
  Filled 2024-01-08: qty 30, 30d supply, fill #0

## 2024-01-08 MED ORDER — VITAMIN D (ERGOCALCIFEROL) 1.25 MG (50000 UNIT) PO CAPS
50000.0000 [IU] | ORAL_CAPSULE | ORAL | 0 refills | Status: AC
  Filled 2024-01-08: qty 12, 84d supply, fill #0

## 2024-01-08 MED ORDER — OMEGA-3-ACID ETHYL ESTERS 1 G PO CAPS
1.0000 g | ORAL_CAPSULE | Freq: Two times a day (BID) | ORAL | 0 refills | Status: AC
  Filled 2024-01-08: qty 60, 30d supply, fill #0

## 2024-01-08 NOTE — Progress Notes (Signed)
 Pacific Language line was used during visit  Established Patient Office Visit  Subjective   Patient ID: Joe Kelly, male    DOB: 12-13-1990  Age: 33 y.o. MRN: [id number]  No chief complaint on file.   HPI  Joe Kelly is a 33 year old male who has history of diabetes, hyperlipidemia, presents for routine follow-up visit . He is non compliant with his follow up visit and was last seen in October of 2024. His HgbA1c checked during visit decreased from 12.7% to 9.9%. He states that he's out of his medications, except his Basaglar  Insulin  and he takes 14 units at bedtime. He states that he checks his blood glucose daily and his fasting reading was 220 mg/dl, and his blood glucose was 269 mg/dl during visit. He denies hypoglycemic symptoms, peripheral neuropathy, performs daily foot checks, but endorses hyperglycemic symptoms. He also lost 8 pounds in 3 weeks. He was seen at the ED on 12/20/23 for right leg abscess to ventral surface of right lower leg, was treated with oral Doxycycline . Currently, it continues to drain minimal amount of serogaguinous fluid, no erythema nor pain to site. Overall, he states that he's doing well and offers no further complaint.   Patient Active Problem List   Diagnosis Date Noted   Abscess of right lower leg 01/08/2024   Vitamin D  deficiency 11/14/2022   Shortness of breath    Hypomagnesemia    Hyperlipidemia    Leukocytosis    Type 2 diabetes mellitus with hyperglycemia (HCC) 01/23/2021   Hyperlipidemia associated with type 2 diabetes mellitus (HCC) 01/23/2021   Hypokalemia    DKA (diabetic ketoacidosis) (HCC) 12/05/2020   Hyponatremia 12/05/2020   Dehydration 12/05/2020   Past Medical History:  Diagnosis Date   Diabetes mellitus without complication (HCC)    Hyperlipidemia    Past Surgical History:  Procedure Laterality Date   NO PAST SURGERIES     Social History   Tobacco Use   Smoking status: Former    Current  packs/day: 0.00    Types: Cigarettes    Quit date: 12/2020    Years since quitting: 3.1   Smokeless tobacco: Never  Vaping Use   Vaping status: Never Used  Substance Use Topics   Alcohol use: Not Currently    Comment: last use 12 years ago   Drug use: Not Currently    Types: Marijuana    Comment: quit 2 years ago   Social History   Socioeconomic History   Marital status: Single    Spouse name: Not on file   Number of children: Not on file   Years of education: Not on file   Highest education level: Not on file  Occupational History   Not on file  Tobacco Use   Smoking status: Former    Current packs/day: 0.00    Types: Cigarettes    Quit date: 12/2020    Years since quitting: 3.1   Smokeless tobacco: Never  Vaping Use   Vaping status: Never Used  Substance and Sexual Activity   Alcohol use: Not Currently    Comment: last use 12 years ago   Drug use: Not Currently    Types: Marijuana    Comment: quit 2 years ago   Sexual activity: Yes  Other Topics Concern   Not on file  Social History Narrative   Not on file   Social Drivers of Health   Financial Resource Strain: Low Risk  (01/08/2024)   Overall Financial Resource Strain (CARDIA)  Difficulty of Paying Living Expenses: Not hard at all  Food Insecurity: No Food Insecurity (01/08/2024)   Hunger Vital Sign    Worried About Running Out of Food in the Last Year: Never true    Ran Out of Food in the Last Year: Never true  Transportation Needs: No Transportation Needs (01/08/2024)   PRAPARE - Administrator, Civil Service (Medical): No    Lack of Transportation (Non-Medical): No  Physical Activity: Insufficiently Active (01/08/2024)   Exercise Vital Sign    Days of Exercise per Week: 2 days    Minutes of Exercise per Session: 20 min  Stress: No Stress Concern Present (01/08/2024)   [employer] of Occupational Health - Occupational Stress Questionnaire    Feeling of Stress : Not at all  Social  Connections: Moderately Integrated (01/08/2024)   Social Connection and Isolation Panel [NHANES]    Frequency of Communication with Friends and Family: More than three times a week    Frequency of Social Gatherings with Friends and Family: More than three times a week    Attends Religious Services: More than 4 times per year    Active Member of [employer] or Organizations: No    Attends Banker Meetings: Never    Marital Status: Living with partner  Intimate Partner Violence: Not At Risk (01/08/2024)   Humiliation, Afraid, Rape, and Kick questionnaire    Fear of Current or Ex-Partner: No    Emotionally Abused: No    Physically Abused: No    Sexually Abused: No   Family History  Problem Relation Age of Onset   Diabetes Mother    Healthy Father    Allergies  Allergen Reactions   Coconut Flavoring Agent (Non-Screening)     Dizziness       Review of Systems  Constitutional: Negative.   Eyes: Negative.   Respiratory: Negative.    Cardiovascular: Negative.   Genitourinary: Negative.   Skin: Negative.   Neurological: Negative.   Endo/Heme/Allergies: Negative.   Psychiatric/Behavioral: Negative.        Objective:     BP 99/64   Pulse 96   Ht 5\' 1"  (1.549 m)   Wt 100 lb 12.8 oz (45.7 kg)   SpO2 97%   BMI 19.05 kg/m  BP Readings from Last 3 Encounters:  01/08/24 99/64  12/20/23 121/72  07/01/23 101/69   Wt Readings from Last 3 Encounters:  01/08/24 100 lb 12.8 oz (45.7 kg)  12/20/23 108 lb 0.4 oz (49 kg)  07/01/23 108 lb (49 kg)    He was encouraged to increase his caloric intake  Physical Exam HENT:     Head: Normocephalic and atraumatic.     Mouth/Throat:     Mouth: Mucous membranes are moist.     Pharynx: Oropharynx is clear.  Eyes:     Extraocular Movements: Extraocular movements intact.     Pupils: Pupils are equal, round, and reactive to light.  Cardiovascular:     Rate and Rhythm: Normal rate and regular rhythm.     Pulses: Normal pulses.      Heart sounds: Normal heart sounds.  Pulmonary:     Effort: Pulmonary effort is normal.     Breath sounds: Normal breath sounds.  Skin:    Capillary Refill: Capillary refill takes less than 2 seconds.     Comments: Healing abscess to ventral surface of right lower leg with minimal amount of serosanguinous exudate  Neurological:     General: No focal  deficit present.     Mental Status: He is alert and oriented to person, place, and time.  Psychiatric:        Mood and Affect: Mood normal.        Behavior: Behavior normal.        Thought Content: Thought content normal.        Judgment: Judgment normal.      Results for orders placed or performed in visit on 01/08/24  POCT Glucose (CBG)  Result Value Ref Range   POC Glucose 269 (A) 70 - 99 mg/dl  POCT HgB K7Q  Result Value Ref Range   Hemoglobin A1C 9.9 (A) 4.0 - 5.6 %   HbA1c POC (<> result, manual entry)     HbA1c, POC (prediabetic range)     HbA1c, POC (controlled diabetic range)      Last CBC Lab Results  Component Value Date   WBC 4.1 10/30/2022   HGB 12.1 (L) 10/30/2022   HCT 36.0 (L) 10/30/2022   MCV 93 10/30/2022   MCH 31.1 10/30/2022   RDW 12.5 10/30/2022   PLT 241 10/30/2022   Last metabolic panel Lab Results  Component Value Date   GLUCOSE 97 07/01/2023   NA 138 07/01/2023   K 3.2 (L) 07/01/2023   CL 98 07/01/2023   CO2 28 07/01/2023   BUN 8 07/01/2023   CREATININE 0.55 (L) 07/01/2023   EGFR 134 07/01/2023   CALCIUM  9.6 07/01/2023   PHOS 2.5 03/11/2021   PROT 6.7 07/01/2023   ALBUMIN 4.2 07/01/2023   LABGLOB 2.5 07/01/2023   AGRATIO 2.0 10/30/2022   BILITOT 0.5 07/01/2023   ALKPHOS 165 (H) 07/01/2023   AST 19 07/01/2023   ALT 18 07/01/2023   ANIONGAP 7 03/11/2021   Last lipids Lab Results  Component Value Date   CHOL 239 (H) 07/01/2023   HDL 37 (L) 07/01/2023   LDLCALC 87 07/01/2023   TRIG 702 (HH) 07/01/2023   CHOLHDL 6.5 (H) 07/01/2023   Last hemoglobin A1c Lab Results  Component  Value Date   HGBA1C 9.9 (A) 01/08/2024   Last thyroid  functions Lab Results  Component Value Date   TSH 1.934 12/05/2020   Last vitamin D  Lab Results  Component Value Date   VD25OH 19.9 (L) 07/01/2023   Last vitamin B12 and Folate Lab Results  Component Value Date   VITAMINB12 798 03/28/2021   FOLATE 16.9 03/28/2021      The ASCVD Risk score (Arnett DK, et al., 2019) failed to calculate for the following reasons:   The 2019 ASCVD risk score is only valid for ages 26 to 25    Assessment & Plan:   1. Type 2 diabetes mellitus with hyperglycemia, with long-term current use of insulin  (HCC) (Primary) - His HgbA1c was 9.9%, and goal should be less than 7%, his diabetes is not under controlled, due to non compliance, he will continue his glargine at 14 units at bedtime. Check his blood glucose bid, record and bring log to follow up appointment. He was encouraged to continue on low carb/non concentrated sweet diet . - POCT Glucose (CBG) - POCT HgB A1C - Comp Met (CMET); Future - Insulin  Glargine (BASAGLAR  KWIKPEN) 100 UNIT/ML; Inject 14 Units into the skin at bedtime.  Dispense: 15 mL; Refill: 1 - Microalbumin / creatinine urine ratio; Future  2. Vitamin D  deficiency - He was started on Ergocalciferol  weekly and advised to get some sunlight. - VITAMIN D  25 Hydroxy (Vit-D Deficiency, Fractures); Future - Vitamin  D, Ergocalciferol , (DRISDOL ) 1.25 MG (50000 UNIT) CAPS capsule; Take 1 capsule (50,000 Units total) by mouth every 7 (seven) days.  Dispense: 12 capsule; Refill: 0  3. Hyperlipidemia associated with type 2 diabetes mellitus (HCC) - He will continue current medication and will recheck Lipid panel, low fat/cholesterol diet. - Lipid panel; Future - atorvastatin  (LIPITOR) 80 MG tablet; Take 1 tablet (80 mg total) by mouth daily.  Dispense: 30 tablet; Refill: 0  4. High triglycerides - He will continue current medication and will recheck Lipid panel, low fat/cholesterol  diet. - omega-3 acid ethyl esters (LOVAZA ) 1 g capsule; Take 1 (one) capsule by mouth 2 (two) times daily.  Dispense: 60 capsule; Refill: 0  5. Abscess of right lower leg - No erythema, pain, swelling was observed to healing site. He was encouraged to monitor healing abscess, and notify clinic for worsening symptoms.  He left prior to collecting his lab, will reach out for labs to be done prior to next visit.  Return in about 19 days (around 01/27/2024), or if symptoms worsen or fail to improve.    Adair Lauderback E Padme Arriaga, NP

## 2024-01-08 NOTE — Patient Instructions (Signed)
 Colesterol elevado High Cholesterol  El colesterol elevado es una afeccin que se caracteriza porque la sangre tiene niveles altos de una sustancia [place], cerosa y parecida a la grasa (colesterol). El hgado fabrica todo el colesterol que el organismo necesita. El organismo humano necesita pequeas cantidades de colesterol para formar las clulas. El exceso de colesterol se obtiene de los alimentos que se consumen. La sangre transporta el colesterol desde el hgado al resto del cuerpo. Si tiene el colesterol elevado, pueden acumularse depsitos (placas) en las paredes de las arterias. Las arterias son los vasos sanguneos que transportan la sangre desde el corazn al resto del cuerpo. Las placas causan que las arterias se Armed forces training and education officer y se endurezcan. Las placas de colesterol aumentan el riesgo de sufrir un infarto de miocardio y un accidente cerebrovascular. Trabaje con el mdico para [employer] concentraciones de colesterol en un rango saludable. Qu incrementa el riesgo? Los siguientes factores pueden hacer que sea ms propenso a Aeronautical engineer afeccin: Consumir alimentos con alto contenido de grasa animal (grasa saturada) o colesterol. Tener sobrepeso. No hacer suficiente ejercicio fsico. Tener antecedentes familiares de colesterol alto (hipercolesterolemia familiar). Consumir productos con tabaco. Tener diabetes. Cules son los signos o sntomas? En la [employer], el colesterol alto no causa ningn sntoma por lo general. En los [place] graves, los niveles muy altos de colesterol pueden causar: Protuberancias de grasa debajo de la piel (xantomas). Un anillo blanco o gris alrededor del centro negro (pupila) del ojo. Cmo se diagnostica? Esta afeccin se puede diagnosticar en funcin de los [address] de un anlisis de [place]. Si es mayor de 20 aos, es posible que el mdico le controle el nivel de colesterol cada 4 a 6 aos. Los controles pueden ser ms frecuentes si tiene el  colesterol elevado u otros factores de riesgo de enfermedad cardaca. En el anlisis de sangre de [place], se determina lo siguiente: El colesterol "malo", o colesterol LDL. Este es el principal tipo de colesterol que causa enfermedades cardacas. El nivel recomendado es de menos de 100 mg/dl (1.61 mmol/l). El colesterol "bueno", o colesterol HDL. El HDL ayuda a proteger contra la enfermedad cardaca porque limpia las arterias y arrastra el LDL al hgado para que lo procese. El nivel recomendado de HDL es de 60 mg/dl (0.96 mmol/l) o ms. Triglicridos. Estos son grasas que el organismo puede Academic librarian o quemar como fuente de [place]. El nivel recomendado es de menos de 150 mg/dl (0.45 mmol/l). Colesterol total. Mide la cantidad total de colesterol en la sangre, e incluye el colesterol LDL, el colesterol HDL y los triglicridos. El nivel recomendado es de menos de 200 mg/dl (4.09 mmol/l). Cmo se trata? El tratamiento para el colesterol alto comienza con cambios en el estilo de vida, tales como dieta y ejercicio. Cambios en la dieta. Es posible que le indiquen que consuma alimentos con ms [place] y menos grasas saturadas o Engineer, mining. Cambios en el estilo de vida. Estos pueden incluir hacer actividad fsica con regularidad, mantener un peso saludable y dejar de consumir productos con tabaco. Medicamentos. Estos se administran cuando los [employer] dieta y en el estilo de vida no han sido eficaces. Es posible que le receten medicamentos llamados estatinas para bajar sus niveles de colesterol. Siga estas instrucciones en su casa: Comida y bebida  Siga una dieta saludable y [place]. Esta dieta incluye lo siguiente: Porciones diarias de frutas y verduras frescas, congeladas o enlatadas. Porciones diarias de alimentos integrales con alto contenido de [place]. Alimentos  con bajo contenido de grasas saturadas y grasas trans. Estos incluyen carne de ave y pescado sin piel, cortes de carne magros  y productos lcteos descremados. Una variedad de pescado, especialmente pescado graso que contenga cidos grasos omega 3. Propngase comer pescado al [employer] veces por semana. Evite los alimentos y las bebidas que tengan Engineer, mining. Use mtodos de coccin saludables, como asar, Software engineer, hervir, hornear, escalfar, cocer al vapor y Actor. No fra los alimentos excepto para saltearlos. Si bebe alcohol: Limite la cantidad que bebe a lo siguiente: De 0 a 1 medida por da para las mujeres que no estn embarazadas. De 0 a 2 medidas por da para los hombres. Sepa cunta cantidad de alcohol hay en las bebidas. En los [address], una medida equivale a una botella de cerveza de 12 oz (355 ml), un vaso de vino de 5 oz (148 ml) o un vaso de una bebida alcohlica de alta graduacin de 1 oz (44 ml). Estilo de vida  Haga ejercicio con regularidad. Trate de hacer un total de 150 minutos de actividad fsica por semana. Aumente la cantidad de ejercicio fsico que realiza mediante actividades como la jardinera, Gaffer a Advertising account planner o usar las escaleras. No consuma ningn producto que contenga nicotina o tabaco. Estos productos incluyen cigarrillos, tabaco para Theatre manager y aparatos de vapeo, como los Administrator, Civil Service. Si necesita ayuda para dejar de consumir estos productos, consulte al [employer]. Indicaciones generales Use los medicamentos de venta libre y los recetados solamente como se lo haya indicado el mdico. Oceanographer a todas las visitas de seguimiento. Esto es importante. Dnde buscar ms informacin American Heart Association (Asociacin Estadounidense del Corazn): www.heart.org National Heart, Lung, and Blood Institute ([employer] del [place], los Pulmones y Risk manager): [email or web address] Comunquese con un mdico si: Tiene dificultad para alcanzar o mantener una alimentacin sana y un peso saludable. Est por comenzar un programa de ejercicios. No puede dejar de fumar. Solicite ayuda  de inmediato si: Midwife. Tiene dificultad para respirar. Tiene molestias o dolor en la [place], el cuello, la espalda, los hombros o [place]. Tiene sntomas de un accidente cerebrovascular. "BE FAST" es una manera fcil de recordar los principales signos de advertencia de un accidente cerebrovascular: B: Balance (equilibrio). Los signos son mareos, dificultad repentina para caminar o prdida del equilibrio. E - Eyes (ojos). Los signos son problemas para ver o un cambio repentino en la visin. F: Face (rostro). Los signos son debilidad repentina o entumecimiento del rostro, o el rostro o el prpado que se caen hacia un lado. A: Arms (brazos). Los signos son debilidad o entumecimiento en un brazo. Esto sucede de repente y generalmente en un lado del cuerpo. S: Speech (habla). Los signos son dificultad para hablar, hablar arrastrando las palabras o dificultad para comprender lo que las Forensic scientist. T: Time (tiempo). Es tiempo de llamar al servicio de Sports administrator. Anote la hora a la que [employer] sntomas. Presenta otros signos de un accidente cerebrovascular, como los siguientes: Dolor de cabeza sbito e intenso que no tiene causa aparente. Nuseas o vmitos. Convulsiones. Estos sntomas pueden representar un problema grave que constituye Radio broadcast assistant. No espere a ver si los sntomas desaparecen. Solicite atencin mdica de inmediato. Comunquese con el servicio de emergencias de su localidad (911 en los [place]). No conduzca por sus propios medios [employer]. Resumen Las placas de colesterol aumentan el riesgo de sufrir un infarto de miocardio y un accidente cerebrovascular. Trabaje con el mdico  para [employer] de colesterol en un rango saludable. Siga una dieta saludable y equilibrada, haga ejercicio con regularidad y Shan Levans un peso saludable. No consuma ningn producto que contenga nicotina o tabaco. Estos productos incluyen  cigarrillos, tabaco para Theatre manager y aparatos de vapeo, como los Administrator, Civil Service. Obtenga ayuda de inmediato si tiene cualquier sntoma de un accidente cerebrovascular. Esta informacin no tiene Theme park manager el consejo del mdico. Asegrese de hacerle al mdico cualquier pregunta que tenga. Document Revised: 11/06/2020 Document Reviewed: 11/06/2020 Elsevier Patient Education  2024 Elsevier Inc.Recuento de carbohidratos para la diabetes mellitus en los adultos Carbohydrate Counting for Diabetes Mellitus, Adult El recuento de carbohidratos es un mtodo para llevar un registro de la cantidad de carbohidratos que se ingieren. La ingesta de carbohidratos aumenta la cantidad de azcar (glucosa) en la sangre. El recuento de la cantidad de carbohidratos que ingiere mejora el control de su glucemia. Esto, a su vez, le ayuda a controlar su diabetes. Los carbohidratos se miden en gramos (g) por porcin. Es importante saber la cantidad de carbohidratos (en gramos o por tamao de porcin) que se puede ingerir en cada comida. Esto es [name]. Un nutricionista puede ayudarlo a crear un plan de alimentacin y a calcular la cantidad de carbohidratos que debe ingerir en cada comida y colacin. Qu alimentos contienen carbohidratos? Los siguientes alimentos incluyen carbohidratos: Granos, como panes y cereales. Frijoles secos y productos con soja. Verduras con almidn, como papas, guisantes y maz. Nils Pyle y jugos de frutas. Leche y Dentist. Dulces y colaciones, como pasteles, galletas, caramelos, papas fritas de bolsa y refrescos. Cmo se calculan los carbohidratos de los alimentos? Hay dos maneras de calcular los carbohidratos de los alimentos. Puede leer las etiquetas de los alimentos o aprender cules son los tamaos de las porciones estndar de los alimentos. Puede usar cualquiera de estos mtodos o una combinacin de [place]. Usar la [name] La lista de  Informacin nutricional est incluida en las etiquetas de casi todas las bebidas y los alimentos envasados de los [place]. Esto incluye lo siguiente: El tamao de la porcin. Informacin sobre los nutrientes de cada porcin, incluidos los gramos de carbohidratos por porcin. Para usar la Informacin nutricional, decida cuntas porciones tomar. Luego, multiplique el nmero de porciones por la cantidad de carbohidratos por porcin. El nmero resultante es la cantidad total de carbohidratos que comer. Conocer los tamaos de las porciones estndar de los alimentos Cuando coma alimentos que contengan carbohidratos y que no estn envasados o no incluyan la informacin nutricional en la etiqueta, debe medir las porciones para poder calcular los gramos de carbohidratos. Mida los alimentos que comer con una balanza de alimentos o una taza medidora, si es necesario. Decida cuntas porciones de [name], systems. Multiplique el nmero de porciones por 15. En los alimentos que contienen carbohidratos, una porcin Ramos a 15 g de carbohidratos. Por ejemplo, si come 2 tazas o 10 onzas (300 g) de fresas, habr comido 2 porciones y 30 g de carbohidratos (2 porciones x 15 g = 30 g). En el caso de las comidas que contienen mezclas de ms de un alimento, como las sopas y los guisos, debe calcular los carbohidratos de cada alimento que se incluye. La siguiente lista contiene los tamaos de porciones estndar de los alimentos ricos en carbohidratos ms comunes. Cada una de estas porciones tiene aproximadamente 15 g de carbohidratos: 1 rebanada de pan. 1 tortilla de seis pulgadas (15 cm). ? de taza  o 2 onzas (53 g) de arroz o pasta cocidos.  taza o 3 onzas (85 g) de lentejas o frijoles cocidos o enlatados, escurridos y enjuagados.  taza o 3 onzas (85 g) de verduras con almidn, como guisantes, maz o zapallo.  taza o 4 onzas (120 g) de cereal caliente.  taza o 3 onzas (85 g) de papas hervidas o en  pur, o  o 3 onzas (85 g) de una papa grande al horno.  taza o 4 onzas fluidas (118 ml) de jugo de frutas. 1 taza u 8 onzas fluidas (237 ml) de leche. 1 unidad pequea o 4 onzas (106 g) de manzana.  unidad o 2 onzas (63 g) de una banana mediana. 1 taza o 5 oz (150 g) de fresas. 3 tazas o 1 oz (28.3 g) de palomitas de maz. Cul sera un ejemplo de recuento de carbohidratos? Para calcular los gramos de carbohidratos de este ejemplo de comida, siga los pasos que se describen a continuacin. Ejemplo de comida 3 onzas (85 g) de pechugas de pollo. ? de taza o 4 onzas (106 g) de arroz integral.  taza o 3 onzas (85 g) de maz. 1 taza u 8 onzas fluidas (237 ml) de leche. 1 taza o 5 onzas (150 g) de fresas con crema batida sin azcar. Clculo de carbohidratos Identifique los alimentos que contienen carbohidratos: Arroz. Maz. Leche. Jinny Sanders. Calcule cuntas porciones come de cada alimento: 2 porciones de arroz. 1 porcin de maz. 1 porcin de leche. 1 porcin de fresas. Multiplique cada nmero de porciones por 15 g: 2 porciones de arroz x 15 g = 30 g. 1 porcin de maz x 15 g = 15 g. 1 porcin de leche x 15 g = 15 g. 1 porcin de fresas x 15 g = 15 g. Sume todas las cantidades para conocer el total de gramos de carbohidratos consumidos: 30 g + 15 g + 15 g + 15 g = 75 g de carbohidratos en total. Consejos para seguir este plan Al ir de compras [place] un plan de comidas y luego haga una lista de compras. Compre verduras frescas y congeladas, frutas frescas y congeladas, productos lcteos, huevos, frijoles, lentejas y cereales integrales. Fjese en las etiquetas de los alimentos. Elija los alimentos que tengan ms fibra y Neurosurgeon. Evite los alimentos procesados y los alimentos con [name], clinical. Planificacin de las comidas Trate de consumir la misma cantidad de gramos de carbohidratos en cada comida y en cada colacin. Planifique tomar comidas y colaciones regulares y  equilibradas. Dnde buscar ms informacin [name]: diabetes.org Centers for Disease Control and Prevention (Centros para el Control y la Prevencin de [place]): TonerPromos.no Academy of Nutrition and Dietetics (Academia de Nutricin y Pension scheme manager): [email or web address] Association of Diabetes Care & Education Specialists (Asociacin de Especialistas en Atencin y Educacin sobre la Diabetes): diabeteseducator.org Resumen El recuento de carbohidratos es un mtodo para llevar un registro de la cantidad de carbohidratos que se ingieren. La ingesta de carbohidratos aumenta la cantidad de azcar (glucosa) en la sangre. El recuento de la cantidad de carbohidratos que ingiere mejora el control de su glucemia. Esto le ayuda a Chief Operating Officer su diabetes. Un nutricionista puede ayudarlo a crear un plan de alimentacin y a calcular la cantidad de carbohidratos que debe ingerir en cada comida y colacin. Esta informacin no tiene Theme park manager el consejo del mdico. Asegrese de hacerle al mdico cualquier pregunta que tenga. Document Revised: 05/09/2020 Document Reviewed: 05/09/2020 Elsevier  Patient Education  2024 [employer].

## 2024-01-12 ENCOUNTER — Other Ambulatory Visit: Payer: Self-pay

## 2024-02-10 ENCOUNTER — Ambulatory Visit
Admission: EM | Admit: 2024-02-10 | Discharge: 2024-02-10 | Disposition: A | Discharge status: Home / Self Care | Payer: Self-pay | Attending: Emergency Medicine | Admitting: Emergency Medicine

## 2024-02-10 ENCOUNTER — Ambulatory Visit (INDEPENDENT_AMBULATORY_CARE_PROVIDER_SITE_OTHER): Payer: Self-pay

## 2024-02-10 DIAGNOSIS — M25572 Pain in left ankle and joints of left foot: Secondary | ICD-10-CM

## 2024-02-10 DIAGNOSIS — S92355A Nondisplaced fracture of fifth metatarsal bone, left foot, initial encounter for closed fracture: Secondary | ICD-10-CM

## 2024-02-10 DIAGNOSIS — S93402A Sprain of unspecified ligament of left ankle, initial encounter: Secondary | ICD-10-CM

## 2024-02-10 DIAGNOSIS — S93602A Unspecified sprain of left foot, initial encounter: Secondary | ICD-10-CM

## 2024-02-10 MED ORDER — HYDROCODONE-ACETAMINOPHEN 5-325 MG PO TABS: 1.0000 | ORAL_TABLET | Freq: Four times a day (QID) | ORAL | 0 refills | Status: AC | PRN

## 2024-02-10 MED ORDER — IBUPROFEN 800 MG PO TABS
800.0000 mg | ORAL_TABLET | Freq: Once | ORAL | Status: AC
  Administered 2024-02-10: 800 mg via ORAL

## 2024-02-10 NOTE — ED Provider Notes (Addendum)
 [facility]    CSN: [id number] Arrival date & time: 02/10/24  1344      History   Chief Complaint Chief Complaint  Patient presents with   Foot Pain    HPI Arman Loy Arciniega is a 33 y.o. male.   HPI  33 year old male with past medical history significant for diabetes, hyperlipidemia, and vitamin D  deficiency presents for evaluation of pain in the left foot and ankle.  He reports that he was at home on a ladder when he missed the last 3 steps.  He landed with his left ankle inverted.  He reports that since injury he has not been able to bear weight.  He currently has an Ace wrap and a soft neoprene brace in place.  He denies any numbness or tingling in his toes.  Spanish interpreter Lindaann Requena [phone] used for history and physical.  Past Medical History:  Diagnosis Date   Diabetes mellitus without complication (HCC)    Hyperlipidemia     Patient Active Problem List   Diagnosis Date Noted   Abscess of right lower leg 01/08/2024   Vitamin D  deficiency 11/14/2022   Shortness of breath    Hypomagnesemia    Hyperlipidemia    Leukocytosis    Type 2 diabetes mellitus with hyperglycemia (HCC) 01/23/2021   Hyperlipidemia associated with type 2 diabetes mellitus (HCC) 01/23/2021   Hypokalemia    DKA (diabetic ketoacidosis) (HCC) 12/05/2020   Hyponatremia 12/05/2020   Dehydration 12/05/2020    Past Surgical History:  Procedure Laterality Date   NO PAST SURGERIES         Home Medications    Prior to Admission medications   Medication Sig Start Date End Date Taking? Authorizing Provider  acetaminophen  (TYLENOL ) 325 MG tablet Take 325 mg by mouth once as needed.   Yes [provider]  albuterol  (VENTOLIN  HFA) 108 (90 Base) MCG/ACT inhaler Inhale 2 puffs into the lungs every 6 (six) hours as needed for wheezing or shortness of breath. 03/11/21  Yes Wieting, Richard, MD  atorvastatin  (LIPITOR) 80 MG tablet Take 1 tablet (80 mg total) by mouth daily.  01/08/24  Yes Iloabachie, Chioma E, NP  blood glucose meter kit and supplies KIT Dispense based on patient and insurance preference. Use up to four times daily as directed to monitor blood glucose 01/17/21  Yes Tukov-Yual, Magdalene S, NP  HYDROcodone-acetaminophen  (NORCO/VICODIN) 5-325 MG tablet Take 1-2 tablets by mouth every 6 (six) hours as needed. Maximum 6 tablets in a 24-hour period. 02/10/24  Yes Kent Pear, NP  Insulin  Glargine (BASAGLAR  KWIKPEN) 100 UNIT/ML Inject 14 Units into the skin at bedtime. 01/08/24  Yes Iloabachie, Chioma E, NP  Insulin  Pen Needle (TECHLITE PEN NEEDLES) 32G X 4 MM MISC use with basaglar  and humalog  01/30/23  Yes Iloabachie, Chioma E, NP  Insulin  Pen Needle 32G X 4 MM MISC Use as directed with insulin  pens. 03/15/21  Yes Iloabachie, Chioma E, NP  omega-3 acid ethyl esters (LOVAZA ) 1 g capsule Take 1 (one) capsule by mouth 2 (two) times daily. 01/08/24  Yes Iloabachie, Chioma E, NP  Vitamin D , Ergocalciferol , (DRISDOL ) 1.25 MG (50000 UNIT) CAPS capsule Take 1 capsule (50,000 Units total) by mouth every 7 (seven) days. 01/08/24  Yes Iloabachie, Chioma E, NP    Family History Family History  Problem Relation Age of Onset   Diabetes Mother    Healthy Father     Social History Social History   Tobacco Use   Smoking status: Former  Current packs/day: 0.00    Types: Cigarettes    Quit date: 12/2020    Years since quitting: 3.1   Smokeless tobacco: Never  Vaping Use   Vaping status: Never Used  Substance Use Topics   Alcohol use: Not Currently    Comment: last use 12 years ago   Drug use: Not Currently    Types: Marijuana    Comment: quit 2 years ago     Allergies   Coconut flavoring agent (non-screening)   Review of Systems Review of Systems  Musculoskeletal:  Positive for arthralgias and joint swelling.  Skin:  Positive for color change.  Neurological:  Negative for weakness and numbness.     Physical Exam Triage Vital Signs ED Triage Vitals  [02/10/24 1358]  Encounter Vitals Group     BP      Systolic BP Percentile      Diastolic BP Percentile      Pulse      Resp 16     Temp      Temp Source Oral     SpO2      Weight      Height      Head Circumference      Peak Flow      Pain Score      Pain Loc      Pain Education      Exclude from Growth Chart    No data found.  Updated Vital Signs BP 114/72 (BP Location: Right Arm)   Pulse 95   Temp 98.7 F (37.1 C) (Oral)   Resp 16   Ht 5\' 1"  (1.549 m)   Wt 105 lb (47.6 kg)   SpO2 95%   BMI 19.84 kg/m   Visual Acuity Right Eye Distance:   Left Eye Distance:   Bilateral Distance:    Right Eye Near:   Left Eye Near:    Bilateral Near:     Physical Exam Vitals and nursing note reviewed.  Constitutional:      Appearance: Normal appearance.  Musculoskeletal:        General: Swelling, tenderness and signs of injury present.  Skin:    General: Skin is warm and dry.     Capillary Refill: Capillary refill takes less than 2 seconds.     Findings: Bruising present.  Neurological:     General: No focal deficit present.     Mental Status: He is alert and oriented to person, place, and time.      UC Treatments / Results  Labs (all labs ordered are listed, but only abnormal results are displayed) Labs Reviewed - No data to display  EKG   Radiology No results found.  Procedures Procedures (including critical care time)  Medications Ordered in UC Medications  ibuprofen  (ADVIL ) tablet 800 mg (800 mg Oral Given 02/10/24 1424)    Initial Impression / Assessment and Plan / UC Course  I have reviewed the triage vital signs and the nursing notes.  Pertinent labs & imaging results that were available during my care of the patient were reviewed by me and considered in my medical decision making (see chart for details).   Patient is a pleasant, nontoxic-appearing 33 year old male presenting for evaluation of left foot and ankle pain as outlined in HPI  above.  As you can see the image above, the patient has ecchymosis on the dorsal and lateral foot that extends from the middle phalanx of his 2nd through 4th toes and extends proximally to  the anterior ankle.  The patient has marked tenderness with palpation of the entire area of ecchymosis.  No appreciable crepitus.  He has normal sensation in his toes and normal range of motion of his toes.  No tenderness with palpation of the medial or lateral malleolus, calcaneus, Achilles tendon, or the arch.  I will obtain radiographs of the patient's left foot and ankle to rule out any bony abnormality.  The patient did take Tylenol  prior to arrival but reports that he is still having pain so I will order 800 mg of p.o. ibuprofen  to be administered prior to radiographs being obtained.  Right ankle x-rays independently reviewed and evaluated by me.  Impression: The ankle mortise is well-maintained and there is no evidence of fracture or dislocation.  Soft tissues are unremarkable.  Radiology overread is pending. Radiology impression states no acute fracture or dislocation.  Right foot films independently reviewed and evaluated by me.  Impression: There is a questionable, nondisplaced fracture of the proximal fifth metatarsal.  Tarsal bones appear to be intact and joint spaces are well-maintained.  Soft tissue swelling is present.  Radiology overread is pending. Radiology impression states nondisplaced avulsion fracture of the base of the fifth metatarsal.  I will have the staff place the patient in a cam boot and crutches to assist with ambulation and I will have him follow-up with podiatry.  He may continue to use over-the-counter Tylenol  and/or ibuprofen  as needed for mild to moderate pain.  I will send a prescription for Norco to the pharmacy that he can have for more severe pain.  He may take 1 to 2 tablets every 6 hours as needed for pain.  Maximum 6 tablets in a 24-hour period.   Final Clinical  Impressions(s) / UC Diagnoses   Final diagnoses:  Pain in joint involving left ankle and foot  Nondisplaced fracture of fifth metatarsal bone, left foot, initial encounter for closed fracture  Sprain of left foot, initial encounter  Sprain of left ankle, unspecified ligament, initial encounter     Discharge Instructions      La radiografa de su tobillo muestra lo que podra ser Ebb Goldman pequea fractura sin desplazamiento en la base del quinto metatarsiano. El resto de sus huesos estn intactos. Dado el grado de inflamacin y hematomas que presenta, sospecho que tambin ha sufrido un esguince importante en el pie y el tobillo.  Use la bota de leva cuando est de pie y en movimiento. Puede quitrsela para baarse y tambin al acostarse por la noche.  Mantenga el pie y el tobillo izquierdos elevados tanto como sea posible para ayudar a disminuir la inflamacin y Engineer, materials.  Puede quitarse la bota y Contractor hielo en el pie y el tobillo durante 20 minutos seguidos, 2 o 3 veces al da, para Engineer, materials y la inflamacin.  Use Tylenol  o ibuprofeno de venta libre, segn las instrucciones del envase, segn sea necesario para el dolor leve o moderado.  He enviado una receta de Norco y un analgsico crnico a la Corporate investment banker, y Museum/gallery conservator de 1 a 2 comprimidos cada 6 horas, segn sea necesario, para el dolor intenso.  Si toma Norco, no beba alcohol, conduzca ni opere maquinaria pesada, ya que le producir sedacin. Adems, este medicamento contiene Tylenol , as que si tambin toma Tylenol  simple, asegrese de no tomar ms de 4000 mg en 24 horas.  Use muletas para desplazarse.  Lo he derivado a Wellsite geologist para programar una cita. Supervisarn  su proceso de recuperacin y determinarn si necesita otras intervenciones.  Your ankle x-rays show what could possibly be a small, undisplaced fracture at the base of your fifth metatarsal. The rest of your bones are all intact. Given the  degree of swelling and bruising you have I suspect that you have also suffered a significant sprain to your foot and ankle.  Wear the cam boot when you are up and moving. You may take it off to bathe and also when you go to bed at night.  Keeping your left foot and ankle elevated is as much as possible to help decrease swelling and aid in pain relief.  You may remove the boot and apply ice to your foot and ankle for 20 minutes at a time, 2-3 times a day, to help with pain and swelling.  Use over-the-counter Tylenol  and or ibuprofen  according to package instructions as needed for mild to moderate pain.  I have sent a prescription for Norco, and a chronic pain medicine, to the pharmacy and you may use 1 to 2 tablets every 6 hours as needed for severe pain.  If you take the Norco do not drink alcohol, drive a vehicle, or operate heavy machinery as it will sedate you. Also, this medication contains Tylenol  so if you are also taking plain Tylenol  make sure you are not taking more than 4000 mg in 24 hours.  Use the crutches to assist you with getting around.  I have made a referral to podiatry, they will call you to make an appointment. They will monitor your healing process and determine if any other interventions are needed.  Your ankle x-rays show what could possibly be a small, nondisplaced fracture at the base of your fifth metatarsal.  The rest of your bones are all intact.  Given by the degree of swelling and bruising you have I suspect that you have also suffered a significant sprain to your foot and ankle.  Wear the cam boot when you are up and moving.  You may take it off to bathe and also when you go to bed at night.  Keep your left foot and ankle elevated is much as possible to help decrease swelling and aid in pain relief.  You may remove the boot and apply ice to your foot and ankle for 20 minutes at a time, 2-3 times a day, to help with pain and swelling.  Use over-the-counter  Tylenol  and or ibuprofen  according to package instructions as needed for mild to moderate pain.  I have sent a prescription for Norco, and a chronic pain medicine, to the pharmacy and you may use 1 to 2 tablets every 6 hours as needed for severe pain.  If you take the Norco do not drink alcohol, drive a vehicle, or operate heavy machinery as it will sedate you.  Also, this medication contains Tylenol  so if you are also taking plain Tylenol  make sure you are not taking more than 4000 mg in 24 hours.  Use the crutches to assist you with getting around.  I have made a referral to podiatry, they will call you to make an appointment.  They will monitor your healing process and determine if any other interventions are needed.   ED Prescriptions     Medication Sig Dispense Auth. Provider   HYDROcodone-acetaminophen  (NORCO/VICODIN) 5-325 MG tablet Take 1-2 tablets by mouth every 6 (six) hours as needed. Maximum 6 tablets in a 24-hour period. 10 tablet Kent Pear, NP  I have reviewed the PDMP during this encounter.   Kent Pear, NP 02/10/24 1524    Kent Pear, NP 02/10/24 1526    Kent Pear, NP 02/10/24 1540

## 2024-02-10 NOTE — ED Triage Notes (Signed)
 Pt c/o L foot pain x1 day. States he fell off a ladder at home which had 3 steps. States he lost his balance & landed on foot. Denies any head injury or LOC.

## 2024-02-10 NOTE — Discharge Instructions (Addendum)
 La radiografa de su tobillo muestra lo que podra ser Ebb Goldman pequea fractura sin desplazamiento en la base del quinto metatarsiano. El resto de sus huesos estn intactos. Dado el grado de inflamacin y hematomas que presenta, sospecho que tambin ha sufrido un esguince importante en el pie y el tobillo.  Use la bota de leva cuando est de pie y en movimiento. Puede quitrsela para baarse y tambin al acostarse por la noche.  Mantenga el pie y el tobillo izquierdos elevados tanto como sea posible para ayudar a disminuir la inflamacin y Engineer, materials.  Puede quitarse la bota y Contractor hielo en el pie y el tobillo durante 20 minutos seguidos, 2 o 3 veces al da, para Engineer, materials y la inflamacin.  Use Tylenol  o ibuprofeno de venta libre, segn las instrucciones del envase, segn sea necesario para el dolor leve o moderado.  He enviado una receta de Norco y un analgsico crnico a la Corporate investment banker, y Museum/gallery conservator de 1 a 2 comprimidos cada 6 horas, segn sea necesario, para el dolor intenso.  Si toma Norco, no beba alcohol, conduzca ni opere maquinaria pesada, ya que le producir sedacin. Adems, este medicamento contiene Tylenol , as que si tambin toma Tylenol  simple, asegrese de no tomar ms de 4000 mg en 24 horas.  Use muletas para desplazarse.  Lo he derivado a Wellsite geologist para programar una cita. Supervisarn su proceso de recuperacin y determinarn si necesita otras intervenciones.  Your ankle x-rays show what could possibly be a small, undisplaced fracture at the base of your fifth metatarsal. The rest of your bones are all intact. Given the degree of swelling and bruising you have I suspect that you have also suffered a significant sprain to your foot and ankle.  Wear the cam boot when you are up and moving. You may take it off to bathe and also when you go to bed at night.  Keeping your left foot and ankle elevated is as much as possible to help decrease swelling and aid  in pain relief.  You may remove the boot and apply ice to your foot and ankle for 20 minutes at a time, 2-3 times a day, to help with pain and swelling.  Use over-the-counter Tylenol  and or ibuprofen  according to package instructions as needed for mild to moderate pain.  I have sent a prescription for Norco, and a chronic pain medicine, to the pharmacy and you may use 1 to 2 tablets every 6 hours as needed for severe pain.  If you take the Norco do not drink alcohol, drive a vehicle, or operate heavy machinery as it will sedate you. Also, this medication contains Tylenol  so if you are also taking plain Tylenol  make sure you are not taking more than 4000 mg in 24 hours.  Use the crutches to assist you with getting around.  I have made a referral to podiatry, they will call you to make an appointment. They will monitor your healing process and determine if any other interventions are needed.  Your ankle x-rays show what could possibly be a small, nondisplaced fracture at the base of your fifth metatarsal.  The rest of your bones are all intact.  Given by the degree of swelling and bruising you have I suspect that you have also suffered a significant sprain to your foot and ankle.  Wear the cam boot when you are up and moving.  You may take it off to bathe and also when you  go to bed at night.  Keep your left foot and ankle elevated is much as possible to help decrease swelling and aid in pain relief.  You may remove the boot and apply ice to your foot and ankle for 20 minutes at a time, 2-3 times a day, to help with pain and swelling.  Use over-the-counter Tylenol  and or ibuprofen  according to package instructions as needed for mild to moderate pain.  I have sent a prescription for Norco, and a chronic pain medicine, to the pharmacy and you may use 1 to 2 tablets every 6 hours as needed for severe pain.  If you take the Norco do not drink alcohol, drive a vehicle, or operate heavy machinery  as it will sedate you.  Also, this medication contains Tylenol  so if you are also taking plain Tylenol  make sure you are not taking more than 4000 mg in 24 hours.  Use the crutches to assist you with getting around.  I have made a referral to podiatry, they will call you to make an appointment.  They will monitor your healing process and determine if any other interventions are needed.

## 2024-02-11 ENCOUNTER — Encounter: Payer: Self-pay | Admitting: Podiatry

## 2024-02-11 ENCOUNTER — Ambulatory Visit: Payer: Self-pay | Admitting: Podiatry

## 2024-02-11 ENCOUNTER — Ambulatory Visit: Payer: Self-pay

## 2024-02-11 DIAGNOSIS — B353 Tinea pedis: Secondary | ICD-10-CM

## 2024-02-11 DIAGNOSIS — L603 Nail dystrophy: Secondary | ICD-10-CM

## 2024-02-11 DIAGNOSIS — S9032XA Contusion of left foot, initial encounter: Secondary | ICD-10-CM

## 2024-02-11 DIAGNOSIS — S92355A Nondisplaced fracture of fifth metatarsal bone, left foot, initial encounter for closed fracture: Secondary | ICD-10-CM

## 2024-02-11 MED ORDER — TERBINAFINE HCL 250 MG PO TABS: 250.0000 mg | ORAL_TABLET | Freq: Every day | ORAL | 0 refills | Status: AC

## 2024-02-11 NOTE — Progress Notes (Addendum)
 Subjective:  Patient ID: Joe Kelly, male    DOB: 05-29-1991,  MRN: [id number] HPI Chief Complaint  Patient presents with   Foot Injury    DOI: 02/09/24 - missed last few steps coming down off of a ladder on Monday, went to ED-xrayed, put in boot and on crutches, does feel some better, still has some swelling and bruising   New Patient (Initial Visit)    33 y.o. male presents with the above complaint.   ROS: Denies fever chills nausea vomit muscle aches pains calf pain back pain chest pain shortness of breath.  Past Medical History:  Diagnosis Date   Diabetes mellitus without complication (HCC)    Hyperlipidemia    Past Surgical History:  Procedure Laterality Date   NO PAST SURGERIES      Current Outpatient Medications:    terbinafine (LAMISIL) 250 MG tablet, Take 1 tablet (250 mg total) by mouth daily., Disp: 90 tablet, Rfl: 0   acetaminophen  (TYLENOL ) 325 MG tablet, Take 325 mg by mouth once as needed., Disp: , Rfl:    albuterol  (VENTOLIN  HFA) 108 (90 Base) MCG/ACT inhaler, Inhale 2 puffs into the lungs every 6 (six) hours as needed for wheezing or shortness of breath., Disp: 8 g, Rfl: 0   atorvastatin  (LIPITOR) 80 MG tablet, Take 1 tablet (80 mg total) by mouth daily., Disp: 30 tablet, Rfl: 0   blood glucose meter kit and supplies KIT, Dispense based on patient and insurance preference. Use up to four times daily as directed to monitor blood glucose, Disp: 1 each, Rfl: 0   HYDROcodone-acetaminophen  (NORCO/VICODIN) 5-325 MG tablet, Take 1-2 tablets by mouth every 6 (six) hours as needed. Maximum 6 tablets in a 24-hour period., Disp: 10 tablet, Rfl: 0   Insulin  Glargine (BASAGLAR  KWIKPEN) 100 UNIT/ML, Inject 14 Units into the skin at bedtime., Disp: 15 mL, Rfl: 1   Insulin  Pen Needle (TECHLITE PEN NEEDLES) 32G X 4 MM MISC, use with basaglar  and humalog , Disp: 100 each, Rfl: 0   Insulin  Pen Needle 32G X 4 MM MISC, Use as directed with insulin  pens., Disp: 200 each,  Rfl: 2   omega-3 acid ethyl esters (LOVAZA ) 1 g capsule, Take 1 (one) capsule by mouth 2 (two) times daily., Disp: 60 capsule, Rfl: 0   Vitamin D , Ergocalciferol , (DRISDOL ) 1.25 MG (50000 UNIT) CAPS capsule, Take 1 capsule (50,000 Units total) by mouth every 7 (seven) days., Disp: 12 capsule, Rfl: 0  Allergies  Allergen Reactions   Coconut Flavoring Agent (Non-Screening)     Dizziness    Review of Systems Objective:  There were no vitals filed for this visit.  General: Well developed, nourished, in no acute distress, alert and oriented x3   Dermatological: Skin is warm, dry and supple bilateral. Nails x 10 are well maintained; remaining integument appears unremarkable at this time.  There are no open lesions and no preulcerative lesions.  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  Vascular: Dorsalis Pedis artery and Posterior Tibial artery pedal pulses are 2/4 bilateral with immedate capillary fill time. Pedal hair growth present. No varicosities and no lower extremity edema present bilateral.   Neruologic: Grossly intact via light touch bilateral. Vibratory intact via tuning fork bilateral. Protective threshold with Semmes Wienstein monofilament intact to all pedal sites bilateral. Patellar and Achilles deep tendon reflexes 2+ bilateral. No Babinski or clonus noted bilateral.   Musculoskeletal: No gross boney  pedal deformities bilateral. No pain, crepitus, or limitation noted with foot and ankle range of motion bilateral. Muscular strength 5/5 in all groups tested bilateral.  Erythema edema ecchymosis and pain on palpation of the fifth metatarsal base left  Gait: Unassisted, Nonantalgic.    Radiographs:  Radiographs were reviewed which were taken at urgent care yesterday regarding his fifth metatarsal left foot.  He does demonstrate a nondisplaced fracture to the fifth metatarsal  base.  Assessment & Plan:   Assessment: Fifth met base fracture probably avulsion fracture  Plan: Placed him back in his cam boot instructed him to be nonweightbearing is much as possible utilizing his crutches or a knee scooter that he could order online.  Follow-up with him in 4 to 6 weeks for another set of x-rays left foot.  Started him on 90 days of Lamisil today.  Discussed that possible side effects associated with this he understands that and is amenable.     Oval Cavazos T. [place], [place]

## 2024-02-24 ENCOUNTER — Telehealth: Payer: Self-pay

## 2024-02-24 NOTE — Telephone Encounter (Signed)
 Made pt appt

## 2024-02-24 NOTE — Telephone Encounter (Signed)
-----   Message from Gregg FORBES Hockey sent at 02/12/2024  7:08 PM EDT ----- Pls schedule a F/U appointment on 04/09/24 and make a telephone note

## 2024-02-27 ENCOUNTER — Encounter: Payer: Self-pay | Admitting: Adult Health

## 2024-03-17 ENCOUNTER — Ambulatory Visit (INDEPENDENT_AMBULATORY_CARE_PROVIDER_SITE_OTHER): Payer: Self-pay | Admitting: Podiatry

## 2024-03-17 ENCOUNTER — Ambulatory Visit (INDEPENDENT_AMBULATORY_CARE_PROVIDER_SITE_OTHER): Payer: Self-pay

## 2024-03-17 DIAGNOSIS — S92355D Nondisplaced fracture of fifth metatarsal bone, left foot, subsequent encounter for fracture with routine healing: Secondary | ICD-10-CM

## 2024-03-17 DIAGNOSIS — S92355A Nondisplaced fracture of fifth metatarsal bone, left foot, initial encounter for closed fracture: Secondary | ICD-10-CM

## 2024-03-17 NOTE — Progress Notes (Signed)
 He presents today for follow-up of his fracture is now 4 weeks status post fracture fifth metatarsal base left foot.  He states that it still swells and is sore.  Objective: Vital signs are stable alert and oriented x 3.  He presents today walking his tennis shoes now using a walker crutches or scooter.  Radiographs taken today demonstrate slowly healing fifth metatarsal base fracture left foot.  Assessment: Fracture fifth metatarsal base left.  Plan: Recommended that he stay in his cam boot at all times and follow-up with me in 4 to 6 weeks for another set of x-rays

## 2024-03-18 NOTE — Addendum Note (Signed)
 Addended by: ELAYNE ROSINA BRAVO on: 03/18/2024 07:59 PM   Modules accepted: Level of Service

## 2024-04-08 ENCOUNTER — Other Ambulatory Visit: Payer: Self-pay

## 2024-04-08 ENCOUNTER — Ambulatory Visit: Payer: Self-pay | Admitting: Gerontology

## 2024-04-08 VITALS — BP 110/75 | HR 91 | Ht 61.0 in | Wt 101.3 lb

## 2024-04-08 DIAGNOSIS — E559 Vitamin D deficiency, unspecified: Secondary | ICD-10-CM

## 2024-04-08 DIAGNOSIS — Z794 Long term (current) use of insulin: Secondary | ICD-10-CM

## 2024-04-08 DIAGNOSIS — E1169 Type 2 diabetes mellitus with other specified complication: Secondary | ICD-10-CM

## 2024-04-08 LAB — POCT GLYCOSYLATED HEMOGLOBIN (HGB A1C): Hemoglobin A1C: 10.9 % — AB (ref 4.0–5.6)

## 2024-04-08 LAB — GLUCOSE, POCT (MANUAL RESULT ENTRY): POC Glucose: 533 mg/dL — AB (ref 70–99)

## 2024-04-08 MED ORDER — INSULIN LISPRO (1 UNIT DIAL) 100 UNIT/ML (KWIKPEN)
3.0000 [IU] | PEN_INJECTOR | Freq: Three times a day (TID) | SUBCUTANEOUS | 2 refills | Status: AC
  Filled 2024-04-08: qty 15, 167d supply, fill #0

## 2024-04-08 MED ORDER — INSULIN PEN NEEDLE 32G X 6 MM MISC
2 refills | Status: AC
  Filled 2024-04-08: qty 100, 25d supply, fill #0

## 2024-04-08 NOTE — Patient Instructions (Signed)
 Recuento de carbohidratos para la diabetes mellitus en los adultos Carbohydrate Counting for Diabetes Mellitus, Adult El recuento de carbohidratos es un mtodo para llevar un registro de la cantidad de carbohidratos que se ingieren. La ingesta de carbohidratos aumenta la cantidad de azcar (glucosa) en la sangre. El recuento de la cantidad de carbohidratos que ingiere mejora el control de su glucemia. Esto, a su vez, le ayuda a controlar su diabetes. Los carbohidratos se miden en gramos (g) por porcin. Es importante saber la cantidad de carbohidratos (en gramos o por tamao de porcin) que se puede ingerir en cada comida. Esto es [name]. Un nutricionista puede ayudarlo a crear un plan de alimentacin y a calcular la cantidad de carbohidratos que debe ingerir en cada comida y colacin. Qu alimentos contienen carbohidratos? Los siguientes alimentos incluyen carbohidratos: Granos, como panes y cereales. Frijoles secos y productos con soja. Verduras con almidn, como papas, guisantes y maz. Nils Pyle y jugos de frutas. Leche y Dentist. Dulces y colaciones, como pasteles, galletas, caramelos, papas fritas de bolsa y refrescos. Cmo se calculan los carbohidratos de los alimentos? Hay dos maneras de calcular los carbohidratos de los alimentos. Puede leer las etiquetas de los alimentos o aprender cules son los tamaos de las porciones estndar de los alimentos. Puede usar cualquiera de estos mtodos o una combinacin de [place]. Usar la [name] La lista de Informacin nutricional est incluida en las etiquetas de casi todas las bebidas y los alimentos envasados de los [place]. Esto incluye lo siguiente: El tamao de la porcin. Informacin sobre los nutrientes de cada porcin, incluidos los gramos de carbohidratos por porcin. Para usar la Informacin nutricional, decida cuntas porciones tomar. Luego, multiplique el nmero de porciones por la cantidad  de carbohidratos por porcin. El nmero resultante es la cantidad total de carbohidratos que comer. Conocer los tamaos de las porciones estndar de los alimentos Cuando coma alimentos que contengan carbohidratos y que no estn envasados o no incluyan la informacin nutricional en la etiqueta, debe medir las porciones para poder calcular los gramos de carbohidratos. Mida los alimentos que comer con una balanza de alimentos o una taza medidora, si es necesario. Decida cuntas porciones de [name], systems. Multiplique el nmero de porciones por 15. En los alimentos que contienen carbohidratos, una porcin [place] a 15 g de carbohidratos. Por ejemplo, si come 2 tazas o 10 onzas (300 g) de fresas, habr comido 2 porciones y 30 g de carbohidratos (2 porciones x 15 g = 30 g). En el caso de las comidas que contienen mezclas de ms de un alimento, como las sopas y los guisos, debe calcular los carbohidratos de cada alimento que se incluye. La siguiente lista contiene los tamaos de porciones estndar de los alimentos ricos en carbohidratos ms comunes. Cada una de estas porciones tiene aproximadamente 15 g de carbohidratos: 1 rebanada de pan. 1 tortilla de seis pulgadas (15 cm). ? de taza o 2 onzas (53 g) de arroz o pasta cocidos.  taza o 3 onzas (85 g) de lentejas o frijoles cocidos o enlatados, escurridos y enjuagados.  taza o 3 onzas (85 g) de verduras con almidn, como guisantes, maz o zapallo.  taza o 4 onzas (120 g) de cereal caliente.  taza o 3 onzas (85 g) de papas hervidas o en pur, o  o 3 onzas (85 g) de una papa grande al horno.  taza o 4 onzas fluidas (118 ml) de jugo de frutas. 1 taza u  8 onzas fluidas (237 ml) de leche. 1 unidad pequea o 4 onzas (106 g) de manzana.  unidad o 2 onzas (63 g) de una banana mediana. 1 taza o 5 oz (150 g) de fresas. 3 tazas o 1 oz (28.3 g) de palomitas de maz. Cul sera un ejemplo de recuento de carbohidratos? Para calcular los gramos  de carbohidratos de este ejemplo de comida, siga los pasos que se describen a continuacin. Ejemplo de comida 3 onzas (85 g) de pechugas de pollo. ? de taza o 4 onzas (106 g) de arroz integral.  taza o 3 onzas (85 g) de maz. 1 taza u 8 onzas fluidas (237 ml) de leche. 1 taza o 5 onzas (150 g) de fresas con crema batida sin azcar. Clculo de carbohidratos Identifique los alimentos que contienen carbohidratos: Arroz. Maz. Leche. Joe Kelly. Calcule cuntas porciones come de cada alimento: 2 porciones de arroz. 1 porcin de maz. 1 porcin de leche. 1 porcin de fresas. Multiplique cada nmero de porciones por 15 g: 2 porciones de arroz x 15 g = 30 g. 1 porcin de maz x 15 g = 15 g. 1 porcin de leche x 15 g = 15 g. 1 porcin de fresas x 15 g = 15 g. Sume todas las cantidades para conocer el total de gramos de carbohidratos consumidos: 30 g + 15 g + 15 g + 15 g = 75 g de carbohidratos en total. Consejos para seguir este plan Al ir de compras [place] un plan de comidas y luego haga una lista de compras. Compre verduras frescas y congeladas, frutas frescas y congeladas, productos lcteos, huevos, frijoles, lentejas y cereales integrales. Fjese en las etiquetas de los alimentos. Elija los alimentos que tengan ms fibra y Neurosurgeon. Evite los alimentos procesados y los alimentos con [name], clinical. Planificacin de las comidas Trate de consumir la misma cantidad de gramos de carbohidratos en cada comida y en cada colacin. Planifique tomar comidas y colaciones regulares y equilibradas. Dnde buscar ms informacin [name]: diabetes.org Centers for Disease Control and Prevention (Centros para el Control y la Prevencin de [place]): TonerPromos.no Academy of Nutrition and Dietetics (Academia de Nutricin y Pension scheme manager): Theme [email or web address].org Association of Diabetes Care & Education Specialists (Asociacin de Especialistas en  Atencin y Educacin sobre la Diabetes): diabeteseducator.org Resumen El recuento de carbohidratos es un mtodo para llevar un registro de la cantidad de carbohidratos que se ingieren. La ingesta de carbohidratos aumenta la cantidad de azcar (glucosa) en la sangre. El recuento de la cantidad de carbohidratos que ingiere mejora el control de su glucemia. Esto le ayuda a Chief Operating Officer su diabetes. Un nutricionista puede ayudarlo a crear un plan de alimentacin y a calcular la cantidad de carbohidratos que debe ingerir en cada comida y colacin. Esta informacin no tiene Theme park manager el consejo del mdico. Asegrese de hacerle al mdico cualquier pregunta que tenga. Document Revised: 05/09/2020 Document Reviewed: 05/09/2020 Elsevier Patient Education  2024 [employer].

## 2024-04-08 NOTE — Progress Notes (Signed)
 Established Patient Office Visit  Subjective   Patient ID: Joe Kelly, male    DOB: [birth year]  Age: 33 y.o. MRN: [id number]  No chief complaint on file.   HPI  Joe Kelly is a 33 year old male who has history of diabetes, hyperlipidemia, presents for routine follow-up visit .  He states that he's compliant with his medications, denies side effects and continues to make healthy lifestyle changes. His HgbA1c checked during visit increased from 9.9% to 10.9%. He states that he checks his blood glucose bid, forgot his log, states that his fasting reading this morning was 230 mg/dl. His blood glucose during visit was 533 mg/dl. He denies hypo/hyperglycemic symptoms, peripheral neuropathy and performs daily foot checks. He was seen at the ED on 02/10/24 for an injury to his left foot  and x ray showed Nondisplaced avulsion fracture of the base of the fifth metatarsal.  He follow-up with Dr Verta M.T on 03/17/24 for  his fracture is now 4 weeks status post fracture fifth metatarsal base left foot.  He states that it still swells and is sore.Recommended that he stay in his cam boot at all times and follow-up with me in 4 to 6 weeks for another set of x-rays . During visit, he was not wearing Cam boot, no swelling or erythema to left foot was observed. Overall, he states that he's doing well and offers no further complaint.    Review of Systems  Constitutional: Negative.   HENT: Negative.    Eyes: Negative.   Respiratory: Negative.    Cardiovascular: Negative.   Musculoskeletal: Negative.   Skin: Negative.   Neurological: Negative.   Psychiatric/Behavioral: Negative.        Objective:     BP 110/75 (BP Location: Left Arm, Patient Position: Sitting, Cuff Size: Normal)   Pulse 91   Ht 5' 1 (1.549 m)   Wt 101 lb 4.8 oz (45.9 kg)   BMI 19.14 kg/m  BP Readings from Last 3 Encounters:  04/08/24 110/75  02/10/24 114/72  01/08/24 99/64   Wt Readings from Last  3 Encounters:  04/08/24 101 lb 4.8 oz (45.9 kg)  02/10/24 105 lb (47.6 kg)  01/08/24 100 lb 12.8 oz (45.7 kg)      Physical Exam HENT:     Head: Normocephalic and atraumatic.     Mouth/Throat:     Mouth: Mucous membranes are moist.     Pharynx: Oropharynx is clear.  Eyes:     Extraocular Movements: Extraocular movements intact.     Conjunctiva/sclera: Conjunctivae normal.     Pupils: Pupils are equal, round, and reactive to light.  Cardiovascular:     Rate and Rhythm: Normal rate and regular rhythm.     Pulses: Normal pulses.     Heart sounds: Normal heart sounds.  Pulmonary:     Effort: Pulmonary effort is normal.     Breath sounds: Normal breath sounds.  Musculoskeletal:        General: Normal range of motion.     Comments: No swelling nor erythema to left foot, advised to wear Cam boot as recommended.  Skin:    General: Skin is warm.     Capillary Refill: Capillary refill takes less than 2 seconds.  Neurological:     General: No focal deficit present.     Mental Status: He is alert and oriented to person, place, and time.  Psychiatric:        Mood and Affect: Mood normal.  Behavior: Behavior normal.      Results for orders placed or performed in visit on 04/08/24  POCT Glucose (CBG)  Result Value Ref Range   POC Glucose 533 (A) 70 - 99 mg/dl  POCT HgB J8R  Result Value Ref Range   Hemoglobin A1C 10.9 (A) 4.0 - 5.6 %   HbA1c POC (<> result, manual entry)     HbA1c, POC (prediabetic range)     HbA1c, POC (controlled diabetic range)      Last CBC Lab Results  Component Value Date   WBC 4.1 10/30/2022   HGB 12.1 (L) 10/30/2022   HCT 36.0 (L) 10/30/2022   MCV 93 10/30/2022   MCH 31.1 10/30/2022   RDW 12.5 10/30/2022   PLT 241 10/30/2022   Last metabolic panel Lab Results  Component Value Date   GLUCOSE 97 07/01/2023   NA 138 07/01/2023   K 3.2 (L) 07/01/2023   CL 98 07/01/2023   CO2 28 07/01/2023   BUN 8 07/01/2023   CREATININE 0.55 (L)  07/01/2023   EGFR 134 07/01/2023   CALCIUM  9.6 07/01/2023   PHOS 2.5 03/11/2021   PROT 6.7 07/01/2023   ALBUMIN 4.2 07/01/2023   LABGLOB 2.5 07/01/2023   AGRATIO 2.0 10/30/2022   BILITOT 0.5 07/01/2023   ALKPHOS 165 (H) 07/01/2023   AST 19 07/01/2023   ALT 18 07/01/2023   ANIONGAP 7 03/11/2021   Last lipids Lab Results  Component Value Date   CHOL 239 (H) 07/01/2023   HDL 37 (L) 07/01/2023   LDLCALC 87 07/01/2023   TRIG 702 (HH) 07/01/2023   CHOLHDL 6.5 (H) 07/01/2023   Last hemoglobin A1c Lab Results  Component Value Date   HGBA1C 10.9 (A) 04/08/2024   Last thyroid  functions Lab Results  Component Value Date   TSH 1.934 12/05/2020   Last vitamin D  Lab Results  Component Value Date   VD25OH 19.9 (L) 07/01/2023   Last vitamin B12 and Folate Lab Results  Component Value Date   VITAMINB12 798 03/28/2021   FOLATE 16.9 03/28/2021      The ASCVD Risk score (Arnett DK, et al., 2019) failed to calculate for the following reasons:   The 2019 ASCVD risk score is only valid for ages 75 to 55    Assessment & Plan:   1. Type 2 diabetes mellitus with hyperglycemia, with long-term current use of insulin  (HCC) (Primary) - His diabetes is not controlled, his HgbA1c was 10.9%, his goal should be less than 7%. He was started on meal time insulin , encouraged to check his blood glucose tid, record and bring log to follow up appointment. He was advised to continue on low carb/non concentrated sweet diet. - POCT HgB A1C; Future - POCT Glucose (CBG); Future - Glutamic acid decarboxylase auto abs; Future - C-peptide; Future - insulin  lispro (HUMALOG  KWIKPEN) 100 UNIT/ML KwikPen; Inject 3 Units into the skin 3 (three) times daily.  Dispense: 15 mL; Refill: 2 - Insulin  Pen Needle 32G X 6 MM MISC; Use as directed with insulin  pens  Dispense: 200 each; Refill: 2 - POCT Glucose (CBG) - POCT HgB A1C    Return in about 1 week (around 04/15/2024), or if symptoms worsen or fail to  improve.    Kimbra Marcelino E Rilea Arutyunyan, NP

## 2024-04-09 LAB — MICROALBUMIN / CREATININE URINE RATIO
Creatinine, Urine: 14.9 mg/dL
Microalb/Creat Ratio: 28 mg/g{creat} (ref 0–29)
Microalbumin, Urine: 4.1 ug/mL

## 2024-04-13 LAB — COMPREHENSIVE METABOLIC PANEL WITH GFR
ALT: 8 IU/L (ref 0–44)
AST: 12 IU/L (ref 0–40)
Albumin: 4.1 g/dL (ref 4.1–5.1)
Alkaline Phosphatase: 188 IU/L — ABNORMAL HIGH (ref 44–121)
BUN/Creatinine Ratio: 25 — ABNORMAL HIGH (ref 9–20)
BUN: 11 mg/dL (ref 6–20)
Bilirubin Total: 0.4 mg/dL (ref 0.0–1.2)
CO2: 16 mmol/L — ABNORMAL LOW (ref 20–29)
Calcium: 9 mg/dL (ref 8.7–10.2)
Chloride: 96 mmol/L (ref 96–106)
Creatinine, Ser: 0.44 mg/dL — ABNORMAL LOW (ref 0.76–1.27)
Globulin, Total: 2.1 g/dL (ref 1.5–4.5)
Glucose: 378 mg/dL — ABNORMAL HIGH (ref 70–99)
Potassium: 4.7 mmol/L (ref 3.5–5.2)
Sodium: 135 mmol/L (ref 134–144)
Total Protein: 6.2 g/dL (ref 6.0–8.5)
eGFR: 143 mL/min/1.73 (ref >59)

## 2024-04-13 LAB — GLUTAMIC ACID DECARBOXYLASE AUTO ABS: Glutamic Acid Decarb Ab: <5 U/mL (ref 0.0–5.0)

## 2024-04-13 LAB — LIPID PANEL
Chol/HDL Ratio: 10.6 ratio — ABNORMAL HIGH (ref 0.0–5.0)
Cholesterol, Total: 350 mg/dL — ABNORMAL HIGH (ref 100–199)
HDL: 33 mg/dL — ABNORMAL LOW (ref >39)
Triglycerides: 1080 mg/dL (ref 0–149)

## 2024-04-13 LAB — VITAMIN D 25 HYDROXY (VIT D DEFICIENCY, FRACTURES): Vit D, 25-Hydroxy: 15.2 ng/mL — ABNORMAL LOW (ref 30.0–100.0)

## 2024-04-13 LAB — C-PEPTIDE: C-Peptide: 0.7 ng/mL — ABNORMAL LOW (ref 1.1–4.4)

## 2024-04-15 ENCOUNTER — Ambulatory Visit: Payer: Self-pay | Admitting: Gerontology

## 2024-04-20 ENCOUNTER — Ambulatory Visit: Payer: Self-pay | Admitting: Gerontology

## 2024-04-28 ENCOUNTER — Ambulatory Visit: Payer: Self-pay | Admitting: Podiatry

## 2024-04-28 DIAGNOSIS — S92355D Nondisplaced fracture of fifth metatarsal bone, left foot, subsequent encounter for fracture with routine healing: Secondary | ICD-10-CM

## 2024-07-01 ENCOUNTER — Telehealth: Payer: Self-pay

## 2024-07-01 NOTE — Telephone Encounter (Signed)
 Called pt using intereperter no answer. Left msg to r/s appt

## 2024-07-01 NOTE — Telephone Encounter (Signed)
-----   Message from Gregg FORBES Hockey sent at 07/01/2024  9:39 AM EDT ----- Pls schedule an appointment for patient, make telephone note. Thank you

## 2024-07-13 ENCOUNTER — Telehealth: Payer: Self-pay

## 2024-07-13 NOTE — Telephone Encounter (Signed)
-----   Message from Joe Kelly sent at 07/13/2024 11:48 AM EST ----- Pls call to schedule an appointment and make a telephone note. Thank you

## 2024-07-13 NOTE — Telephone Encounter (Signed)
 Called pt with interpreter to make an appt. No answer/ left msg.

## 2024-08-05 ENCOUNTER — Telehealth: Payer: Self-pay | Admitting: Gerontology

## 2024-08-05 NOTE — Telephone Encounter (Signed)
 Made appointment 1/6

## 2024-08-31 ENCOUNTER — Telehealth: Payer: Self-pay | Admitting: Gerontology

## 2024-08-31 NOTE — Telephone Encounter (Signed)
 Left message to inform patient that his appt with Almarie was rescheduled from 09/07/24 to 09/13/24

## 2024-09-07 ENCOUNTER — Ambulatory Visit: Payer: Self-pay | Admitting: Gerontology

## 2024-09-14 ENCOUNTER — Ambulatory Visit: Payer: Self-pay | Admitting: Gerontology

## 2024-09-16 ENCOUNTER — Telehealth: Payer: Self-pay

## 2024-09-16 NOTE — Telephone Encounter (Signed)
 Called Pt for missed appt no answer left VM
